# Patient Record
Sex: Male | Born: 1935
Health system: Southern US, Community
[De-identification: ages and names within clinical notes are randomized; demographics above are authoritative.]

## PROBLEM LIST (undated history)

## (undated) DIAGNOSIS — N183 Chronic kidney disease, stage 3 unspecified: Secondary | ICD-10-CM

## (undated) DIAGNOSIS — H9190 Unspecified hearing loss, unspecified ear: Secondary | ICD-10-CM

## (undated) DIAGNOSIS — I499 Cardiac arrhythmia, unspecified: Secondary | ICD-10-CM

## (undated) DIAGNOSIS — K449 Diaphragmatic hernia without obstruction or gangrene: Secondary | ICD-10-CM

## (undated) DIAGNOSIS — H269 Unspecified cataract: Secondary | ICD-10-CM

## (undated) DIAGNOSIS — E119 Type 2 diabetes mellitus without complications: Secondary | ICD-10-CM

## (undated) DIAGNOSIS — I10 Essential (primary) hypertension: Secondary | ICD-10-CM

## (undated) DIAGNOSIS — K5790 Diverticulosis of intestine, part unspecified, without perforation or abscess without bleeding: Secondary | ICD-10-CM

## (undated) DIAGNOSIS — R55 Syncope and collapse: Secondary | ICD-10-CM

## (undated) DIAGNOSIS — D649 Anemia, unspecified: Secondary | ICD-10-CM

## (undated) DIAGNOSIS — N200 Calculus of kidney: Secondary | ICD-10-CM

## (undated) DIAGNOSIS — I639 Cerebral infarction, unspecified: Secondary | ICD-10-CM

## (undated) DIAGNOSIS — H3530 Unspecified macular degeneration: Secondary | ICD-10-CM

## (undated) DIAGNOSIS — N3289 Other specified disorders of bladder: Secondary | ICD-10-CM

## (undated) HISTORY — DX: Diverticulosis of intestine, part unspecified, without perforation or abscess without bleeding: K57.90

## (undated) HISTORY — DX: Cerebral infarction, unspecified: I63.9

## (undated) HISTORY — PX: KIDNEY STONE SURGERY: SHX686

## (undated) HISTORY — DX: Syncope and collapse: R55

## (undated) HISTORY — DX: Other specified disorders of bladder: N32.89

## (undated) HISTORY — PX: HERNIA REPAIR: SHX51

## (undated) HISTORY — DX: Diaphragmatic hernia without obstruction or gangrene: K44.9

## (undated) HISTORY — PX: EYE SURGERY: SHX253

## (undated) HISTORY — DX: Calculus of kidney: N20.0

## (undated) SURGERY — Surgical Case
Anesthesia: *Unknown

---

## 2010-03-14 ENCOUNTER — Encounter: Admission: RE | Admit: 2010-03-14 | Discharge: 2010-03-14 | Payer: Self-pay | Admitting: Endocrinology

## 2010-03-19 ENCOUNTER — Emergency Department (HOSPITAL_COMMUNITY): Admission: EM | Admit: 2010-03-19 | Discharge: 2010-03-19 | Payer: Self-pay | Admitting: Emergency Medicine

## 2010-03-21 ENCOUNTER — Ambulatory Visit (HOSPITAL_COMMUNITY): Admission: RE | Admit: 2010-03-21 | Discharge: 2010-03-21 | Payer: Self-pay | Admitting: Endocrinology

## 2010-04-05 ENCOUNTER — Ambulatory Visit (HOSPITAL_COMMUNITY): Admission: RE | Admit: 2010-04-05 | Discharge: 2010-04-05 | Payer: Self-pay | Admitting: Interventional Radiology

## 2010-07-17 LAB — HEPATIC FUNCTION PANEL
ALT: 22 U/L (ref 0–53)
AST: 28 U/L (ref 0–37)
Albumin: 3.9 g/dL (ref 3.5–5.2)
Alkaline Phosphatase: 80 U/L (ref 39–117)
Total Bilirubin: 0.5 mg/dL (ref 0.3–1.2)

## 2010-07-17 LAB — URINALYSIS, ROUTINE W REFLEX MICROSCOPIC
Glucose, UA: 500 mg/dL — AB
pH: 6.5 (ref 5.0–8.0)

## 2010-07-17 LAB — BASIC METABOLIC PANEL
BUN: 23 mg/dL (ref 6–23)
CO2: 26 mEq/L (ref 19–32)
Calcium: 9.7 mg/dL (ref 8.4–10.5)
Calcium: 9.9 mg/dL (ref 8.4–10.5)
Chloride: 101 mEq/L (ref 96–112)
Creatinine, Ser: 1.49 mg/dL (ref 0.4–1.5)
GFR calc Af Amer: 47 mL/min — ABNORMAL LOW (ref >60)
GFR calc Af Amer: 54 mL/min — ABNORMAL LOW (ref >60)
Sodium: 139 mEq/L (ref 135–145)

## 2010-07-17 LAB — URINE MICROSCOPIC-ADD ON

## 2010-07-17 LAB — APTT: aPTT: 28 seconds (ref 24–37)

## 2010-07-17 LAB — PROTIME-INR: Prothrombin Time: 14 seconds (ref 11.6–15.2)

## 2010-07-17 LAB — CBC
Hemoglobin: 13.2 g/dL (ref 13.0–17.0)
MCH: 33.2 pg (ref 26.0–34.0)
RBC: 3.98 MIL/uL — ABNORMAL LOW (ref 4.22–5.81)

## 2010-07-17 LAB — DIFFERENTIAL
Eosinophils Absolute: 0.8 10*3/uL — ABNORMAL HIGH (ref 0.0–0.7)
Lymphocytes Relative: 20 % (ref 12–46)
Lymphs Abs: 1.7 10*3/uL (ref 0.7–4.0)
Monocytes Relative: 12 % (ref 3–12)
Neutrophils Relative %: 59 % (ref 43–77)

## 2012-06-24 ENCOUNTER — Emergency Department (HOSPITAL_COMMUNITY): Payer: Medicare Other

## 2012-06-24 ENCOUNTER — Encounter (HOSPITAL_COMMUNITY): Payer: Self-pay | Admitting: *Deleted

## 2012-06-24 ENCOUNTER — Inpatient Hospital Stay (HOSPITAL_COMMUNITY)
Admission: EM | Admit: 2012-06-24 | Discharge: 2012-06-28 | DRG: 494 | Disposition: A | Discharge status: Home Health | Payer: Medicare Other | Attending: Orthopedic Surgery | Admitting: Orthopedic Surgery

## 2012-06-24 DIAGNOSIS — Y9329 Activity, other involving ice and snow: Secondary | ICD-10-CM

## 2012-06-24 DIAGNOSIS — Z882 Allergy status to sulfonamides status: Secondary | ICD-10-CM

## 2012-06-24 DIAGNOSIS — H3530 Unspecified macular degeneration: Secondary | ICD-10-CM | POA: Diagnosis present

## 2012-06-24 DIAGNOSIS — I10 Essential (primary) hypertension: Secondary | ICD-10-CM | POA: Diagnosis present

## 2012-06-24 DIAGNOSIS — S42209A Unspecified fracture of upper end of unspecified humerus, initial encounter for closed fracture: Secondary | ICD-10-CM

## 2012-06-24 DIAGNOSIS — Z79899 Other long term (current) drug therapy: Secondary | ICD-10-CM

## 2012-06-24 DIAGNOSIS — S42293A Other displaced fracture of upper end of unspecified humerus, initial encounter for closed fracture: Principal | ICD-10-CM | POA: Diagnosis present

## 2012-06-24 DIAGNOSIS — W010XXA Fall on same level from slipping, tripping and stumbling without subsequent striking against object, initial encounter: Secondary | ICD-10-CM | POA: Diagnosis present

## 2012-06-24 DIAGNOSIS — Y92009 Unspecified place in unspecified non-institutional (private) residence as the place of occurrence of the external cause: Secondary | ICD-10-CM

## 2012-06-24 DIAGNOSIS — S42213A Unspecified displaced fracture of surgical neck of unspecified humerus, initial encounter for closed fracture: Secondary | ICD-10-CM | POA: Diagnosis present

## 2012-06-24 DIAGNOSIS — E119 Type 2 diabetes mellitus without complications: Secondary | ICD-10-CM | POA: Diagnosis present

## 2012-06-24 HISTORY — DX: Essential (primary) hypertension: I10

## 2012-06-24 HISTORY — DX: Type 2 diabetes mellitus without complications: E11.9

## 2012-06-24 HISTORY — DX: Unspecified macular degeneration: H35.30

## 2012-06-24 HISTORY — DX: Cardiac arrhythmia, unspecified: I49.9

## 2012-06-24 LAB — BASIC METABOLIC PANEL
Calcium: 9.3 mg/dL (ref 8.4–10.5)
GFR calc non Af Amer: 42 mL/min — ABNORMAL LOW (ref >90)
Glucose, Bld: 165 mg/dL — ABNORMAL HIGH (ref 70–99)
Potassium: 5.2 mEq/L — ABNORMAL HIGH (ref 3.5–5.1)
Sodium: 137 mEq/L (ref 135–145)

## 2012-06-24 LAB — CBC WITH DIFFERENTIAL/PLATELET
Basophils Absolute: 0 10*3/uL (ref 0.0–0.1)
Eosinophils Absolute: 0 10*3/uL (ref 0.0–0.7)
Eosinophils Relative: 0 % (ref 0–5)
Lymphocytes Relative: 4 % — ABNORMAL LOW (ref 12–46)
Lymphs Abs: 0.6 10*3/uL — ABNORMAL LOW (ref 0.7–4.0)
MCH: 32.5 pg (ref 26.0–34.0)
Neutrophils Relative %: 89 % — ABNORMAL HIGH (ref 43–77)
Platelets: 187 10*3/uL (ref 150–400)
RBC: 3.82 MIL/uL — ABNORMAL LOW (ref 4.22–5.81)
RDW: 12.7 % (ref 11.5–15.5)
WBC: 12.8 10*3/uL — ABNORMAL HIGH (ref 4.0–10.5)

## 2012-06-24 MED ORDER — HYDROCODONE-ACETAMINOPHEN 5-325 MG PO TABS
1.0000 | ORAL_TABLET | Freq: Once | ORAL | Status: AC
  Administered 2012-06-24: 1 via ORAL
  Filled 2012-06-24: qty 1

## 2012-06-24 NOTE — ED Notes (Signed)
Pt reports falling this afternoon, went to pcp today and had xrays done, has disc with him. Was told +fracture right humerus. +radial pulse

## 2012-06-24 NOTE — ED Notes (Signed)
Pt in gown. RN at the bedside.

## 2012-06-24 NOTE — ED Provider Notes (Signed)
I saw and evaluated the patient, reviewed the resident's note and I agree with the findings and plan.   Loren Racer, MD 06/24/12 2330

## 2012-06-24 NOTE — ED Notes (Signed)
Family at bedside. 

## 2012-06-24 NOTE — Progress Notes (Signed)
Orthopedic Tech Progress Note Patient Details:  Clayton [name] 01/09/1920 [id number]  Ortho Devices Type of Ortho Device: Shoulder immobilizer Ortho Device/Splint Location: (R) UE Ortho Device/Splint Interventions: Application   Jennye Moccasin 06/24/2012, 9:40 PM

## 2012-06-24 NOTE — Progress Notes (Signed)
[facility]opaedic Trauma [facility]   Pt sustained a fall this afternoon resulting in R proximal humerus fracture.  Pt presented to ED after having xrays at PCP. Ortho contacted for admission regarding injury.  Pt with displaced R proximal humerus fx.  Med hx  Notable for DM, macular degeneration and HTN.    Pt to be admitted with plan for OR in am: hemiarthroplasty vs ORIF  CT scan tonight to fully evaluate fx and for surgical planning  Full H&P to follow  Sling for comfort Ice and elevate  NPO after MN  Mearl Latin, PA-C [facility] [phone] (P) 06/24/2012 9:06 PM

## 2012-06-24 NOTE — ED Provider Notes (Signed)
History     CSN: [id number]  Arrival date & time 06/24/12  1801   First MD Initiated Contact with Patient 06/24/12 1846      Chief Complaint  Patient presents with  . Fall    HPI Jari Dipasquale is a 79 y.o. male who presents to the ED after been diagnosed with R humeral fracture.  Patient accompanied by daughter and wife who report the history.  At approx 3:20 today, patient stepped out of car and fell on ice.  Landed on R shoulder.  No LOC.  No amnesia.  No neck, back, chest, abdomen pain.  Seen by PCP and had Xrs done of R humerus and sent here for further evaluation.  Patient with isolated r shoulder pain.  Moderate in severity, worse with movement and palpation, better with rest.  No other associated symptoms.  Past Medical History  Diagnosis Date  . Macular degeneration   . Hypertension   . Diabetes mellitus without complication     History reviewed. No pertinent past surgical history.  History reviewed. No pertinent family history.  History  Substance Use Topics  . Smoking status: Not on file  . Smokeless tobacco: Not on file  . Alcohol Use: No      Review of Systems  Constitutional: Negative for fever and chills.  HENT: Negative for congestion, sore throat and neck pain.   Respiratory: Negative for cough.   Gastrointestinal: Negative for nausea, vomiting, abdominal pain, diarrhea and constipation.  Endocrine: Negative for polyuria.  Genitourinary: Negative for dysuria and hematuria.  Skin: Negative for rash.  Neurological: Negative for headaches.  Psychiatric/Behavioral: Negative.   All other systems reviewed and are negative.    Allergies  Sulfa antibiotics  Home Medications  No current outpatient prescriptions on file.  BP 170/75  Pulse 74  Temp(Src) 98 F (36.7 C) (Oral)  SpO2 96%  Physical Exam  Nursing note and vitals reviewed. Constitutional: He is oriented to person, place, and time. He appears well-developed and well-nourished. No  distress.  HENT:  Head: Normocephalic and atraumatic.  Right Ear: External ear normal.  Left Ear: External ear normal.  Mouth/Throat: Oropharynx is clear and moist. No oropharyngeal exudate.  Eyes: Conjunctivae are normal. Pupils are equal, round, and reactive to light. Right eye exhibits no discharge.  Neck: Normal range of motion. Neck supple. No tracheal deviation present.  Cardiovascular: Normal rate, regular rhythm and intact distal pulses.   Pulmonary/Chest: Effort normal. No respiratory distress. He has no wheezes. He has no rales.  Abdominal: Soft. He exhibits no distension. There is no tenderness. There is no rebound and no guarding.  Musculoskeletal: Normal range of motion. He exhibits no edema (2+ symmetric).  RUE: R proximal humerus swlling anteriorly.  Mild deformity.  2+ radial pulse.  SILT with intact motor median, ulnar, radial nerve distributions.  Neurological: He is alert and oriented to person, place, and time.  Skin: Skin is warm and dry. No rash noted. He is not diaphoretic.  Psychiatric: He has a normal mood and affect.    ED Course  Procedures (including critical care time)  Labs Reviewed  CBC WITH DIFFERENTIAL - Abnormal; Notable for the following:    WBC 12.8 (*)    RBC 3.82 (*)    Hemoglobin 12.4 (*)    HCT 36.9 (*)    Neutrophils Relative 89 (*)    Neutro Abs 11.3 (*)    Lymphocytes Relative 4 (*)    Lymphs Abs 0.6 (*)  All other components within normal limits  BASIC METABOLIC PANEL - Abnormal; Notable for the following:    Potassium 5.2 (*)    Glucose, Bld 165 (*)    BUN 37 (*)    Creatinine, Ser 1.41 (*)    GFR calc non Af Amer 42 (*)    GFR calc Af Amer 49 (*)    All other components within normal limits   Dg Shoulder Right  06/24/2012  *RADIOLOGY REPORT*  Clinical Data: Larey Seat.  Right shoulder pain.  RIGHT SHOULDER - 2+ VIEW  Comparison: None  Findings: There is a complex comminuted and displaced fracture of the humeral head neck.  The [facility]  joint is intact.  IMPRESSION: Complex comminuted and displaced humeral head and neck fractures.   Original Report Authenticated By: Rudie Meyer, M.D.    Dg Humerus Right  06/24/2012  *RADIOLOGY REPORT*  Clinical Data: Larey Seat.  Right arm pain.  RIGHT HUMERUS - 2+ VIEW  Comparison: None  Findings: There is a comminuted and displaced fracture of the humeral head/ neck.  No humeral shaft fracture.  IMPRESSION: No humeral shaft fracture.   Original Report Authenticated By: Rudie Meyer, M.D.      1. Proximal humerus fracture, right       MDM   Jayesh Marbach is a 79 y.o. male who presents who presents to the ED after mechanical ground level fall.  Patient only hurting in R proximal humerus.  Doubt concurrent injury.  XR with R comminuted fracture of humeral head.  Ortho consulted.  Ortho wanting to admit.  Patient safe for admission to floor.  Patient admitted.     Arloa Koh, MD 06/24/12 2236

## 2012-06-25 ENCOUNTER — Encounter (HOSPITAL_COMMUNITY): Payer: Self-pay | Admitting: Orthopedic Surgery

## 2012-06-25 ENCOUNTER — Inpatient Hospital Stay (HOSPITAL_COMMUNITY): Payer: Medicare Other

## 2012-06-25 LAB — APTT: aPTT: 22 seconds — ABNORMAL LOW (ref 24–37)

## 2012-06-25 LAB — HEMOGLOBIN A1C
Hgb A1c MFr Bld: 6.9 % — ABNORMAL HIGH (ref <5.7)
Mean Plasma Glucose: 151 mg/dL — ABNORMAL HIGH (ref <117)

## 2012-06-25 LAB — CBC WITH DIFFERENTIAL/PLATELET
Basophils Absolute: 0 10*3/uL (ref 0.0–0.1)
Eosinophils Absolute: 0.1 10*3/uL (ref 0.0–0.7)
Eosinophils Relative: 1 % (ref 0–5)
MCH: 32.5 pg (ref 26.0–34.0)
MCV: 96 fL (ref 78.0–100.0)
Platelets: 170 10*3/uL (ref 150–400)
RDW: 12.6 % (ref 11.5–15.5)

## 2012-06-25 LAB — PREALBUMIN: Prealbumin: 23.4 mg/dL (ref 17.0–34.0)

## 2012-06-25 LAB — COMPREHENSIVE METABOLIC PANEL
ALT: 11 U/L (ref 0–53)
Alkaline Phosphatase: 60 U/L (ref 39–117)
CO2: 23 mEq/L (ref 19–32)
Calcium: 9.2 mg/dL (ref 8.4–10.5)
GFR calc Af Amer: 47 mL/min — ABNORMAL LOW (ref >90)
GFR calc non Af Amer: 41 mL/min — ABNORMAL LOW (ref >90)
Glucose, Bld: 226 mg/dL — ABNORMAL HIGH (ref 70–99)
Sodium: 135 mEq/L (ref 135–145)

## 2012-06-25 LAB — VITAMIN D 25 HYDROXY (VIT D DEFICIENCY, FRACTURES): Vit D, 25-Hydroxy: 23 ng/mL — ABNORMAL LOW (ref 30–89)

## 2012-06-25 LAB — URINALYSIS, ROUTINE W REFLEX MICROSCOPIC
Glucose, UA: NEGATIVE mg/dL
Hgb urine dipstick: NEGATIVE
Ketones, ur: NEGATIVE mg/dL
Protein, ur: NEGATIVE mg/dL

## 2012-06-25 LAB — ABO/RH: ABO/RH(D): A POS

## 2012-06-25 LAB — URINE MICROSCOPIC-ADD ON

## 2012-06-25 MED ORDER — ADULT MULTIVITAMIN W/MINERALS CH
1.0000 | ORAL_TABLET | Freq: Every day | ORAL | Status: DC
  Administered 2012-06-25 – 2012-06-28 (×4): 1 via ORAL
  Filled 2012-06-25 (×4): qty 1

## 2012-06-25 MED ORDER — ACETAMINOPHEN 650 MG RE SUPP: 650.0000 mg | Freq: Four times a day (QID) | RECTAL | Status: DC | PRN

## 2012-06-25 MED ORDER — MORPHINE SULFATE 2 MG/ML IJ SOLN
1.0000 mg | INTRAMUSCULAR | Status: DC | PRN
  Administered 2012-06-27: 2 mg via INTRAVENOUS
  Filled 2012-06-25: qty 1

## 2012-06-25 MED ORDER — LACTATED RINGERS IV SOLN: INTRAVENOUS | Status: DC

## 2012-06-25 MED ORDER — HYDROCODONE-ACETAMINOPHEN 5-325 MG PO TABS
1.0000 | ORAL_TABLET | ORAL | Status: DC | PRN
  Administered 2012-06-25 (×2): 2 via ORAL
  Administered 2012-06-25 (×2): 1 via ORAL
  Administered 2012-06-26 – 2012-06-28 (×6): 2 via ORAL
  Filled 2012-06-25: qty 2
  Filled 2012-06-25: qty 1
  Filled 2012-06-25 (×7): qty 2

## 2012-06-25 MED ORDER — ACETAMINOPHEN 325 MG PO TABS: 650.0000 mg | ORAL_TABLET | Freq: Four times a day (QID) | ORAL | Status: DC | PRN

## 2012-06-25 MED ORDER — ONDANSETRON HCL 4 MG PO TABS: 4.0000 mg | ORAL_TABLET | Freq: Four times a day (QID) | ORAL | Status: DC | PRN

## 2012-06-25 MED ORDER — LISINOPRIL 20 MG PO TABS
20.0000 mg | ORAL_TABLET | Freq: Every day | ORAL | Status: DC
  Administered 2012-06-25 – 2012-06-27 (×3): 20 mg via ORAL
  Filled 2012-06-25 (×4): qty 1

## 2012-06-25 MED ORDER — DOCUSATE SODIUM 100 MG PO CAPS
100.0000 mg | ORAL_CAPSULE | Freq: Two times a day (BID) | ORAL | Status: DC
  Administered 2012-06-25 – 2012-06-28 (×6): 100 mg via ORAL
  Filled 2012-06-25 (×9): qty 1

## 2012-06-25 MED ORDER — CEFAZOLIN SODIUM-DEXTROSE 2-3 GM-% IV SOLR
2.0000 g | INTRAVENOUS | Status: DC
  Filled 2012-06-25: qty 50

## 2012-06-25 MED ORDER — ALUM & MAG HYDROXIDE-SIMETH 200-200-20 MG/5ML PO SUSP
30.0000 mL | Freq: Four times a day (QID) | ORAL | Status: DC | PRN
  Filled 2012-06-25 (×2): qty 30

## 2012-06-25 MED ORDER — INSULIN ASPART 100 UNIT/ML ~~LOC~~ SOLN
0.0000 [IU] | Freq: Three times a day (TID) | SUBCUTANEOUS | Status: DC
  Administered 2012-06-25: 3 [IU] via SUBCUTANEOUS
  Administered 2012-06-25 (×2): 2 [IU] via SUBCUTANEOUS
  Administered 2012-06-26 – 2012-06-27 (×2): 3 [IU] via SUBCUTANEOUS
  Administered 2012-06-27: 2 [IU] via SUBCUTANEOUS
  Administered 2012-06-27: 3 [IU] via SUBCUTANEOUS
  Administered 2012-06-28: 5 [IU] via SUBCUTANEOUS
  Administered 2012-06-28: 3 [IU] via SUBCUTANEOUS

## 2012-06-25 MED ORDER — ONDANSETRON HCL 4 MG/2ML IJ SOLN: 4.0000 mg | Freq: Four times a day (QID) | INTRAMUSCULAR | Status: DC | PRN

## 2012-06-25 NOTE — H&P (Signed)
Orthopaedic Trauma Service H&P   Chief Complaint:  Fall with R proximal humerus fracture HPI:   Patient is a very pleasant 79 year old right-hand-dominant Caucasian male who sustained a ground-level mechanical fall on 06/24/2012. Patient states that he was getting out of his car and slipped on some ice. He presented to his primary care physician where x-rays were performed and demonstrated a right proximal humerus fracture, patient was then sent to the emergency department for further evaluation and admission. Ortho was consulted regarding the patient. He was admitted for pain control as well as plan for fixation of his fracture. Patient is currently in 5 N. 14 is resting comfortably and complains only of right shoulder pain. Pain is exacerbated with movement and is relieved with rest and pain medication. Patient denies any additional injuries as a result of his fall. Patient denies any chest pain. Patient denies palpitations, shortness of breath, nausea or vomiting. No recent illnesses. No headaches or changes in vision. No localized weakness. Patient denies any numbness or tingling in his right upper extremity.  Past Medical History  Diagnosis Date  . Macular degeneration   . Hypertension   . Diabetes mellitus without complication   . paroxysmal afib   (paroxysmal afib noted on old consult note)  History reviewed. No pertinent past surgical history.  History reviewed. No pertinent family history.  Social History:  reports that he does not drink alcohol or use illicit drugs. His tobacco history is not on file. Lives in [place] with wife Live in apartment Daughter lives in [place] as well and helps out frequently   Allergies:  Allergies  Allergen Reactions  . Sulfa Antibiotics Itching    Medications Prior to Admission  Medication Sig Dispense Refill  . docusate sodium (COLACE) 100 MG capsule Take 100 mg by mouth 2 (two) times daily as needed for constipation.      . fish  oil-omega-3 fatty acids 1000 MG capsule Take 1 g by mouth daily.      Marland Kitchen lisinopril (PRINIVIL,ZESTRIL) 20 MG tablet Take 20 mg by mouth daily.      . metFORMIN (GLUCOPHAGE) 500 MG tablet Take 500 mg by mouth daily with breakfast.      . Multiple Vitamins-Minerals (ICAPS PO) Take 1 capsule by mouth daily.        Results for orders placed during the hospital encounter of 06/24/12 (from the past 48 hour(s))  CBC WITH DIFFERENTIAL     Status: Abnormal   Collection Time    06/24/12  8:06 PM      Result Value Range   WBC 12.8 (*) 4.0 - 10.5 K/uL   RBC 3.82 (*) 4.22 - 5.81 MIL/uL   Hemoglobin 12.4 (*) 13.0 - 17.0 g/dL   HCT 45.4 (*) 09.8 - 11.9 %   MCV 96.6  78.0 - 100.0 fL   MCH 32.5  26.0 - 34.0 pg   MCHC 33.6  30.0 - 36.0 g/dL   RDW 14.7  82.9 - 56.2 %   Platelets 187  150 - 400 K/uL   Neutrophils Relative 89 (*) 43 - 77 %   Neutro Abs 11.3 (*) 1.7 - 7.7 K/uL   Lymphocytes Relative 4 (*) 12 - 46 %   Lymphs Abs 0.6 (*) 0.7 - 4.0 K/uL   Monocytes Relative 7  3 - 12 %   Monocytes Absolute 0.9  0.1 - 1.0 K/uL   Eosinophils Relative 0  0 - 5 %   Eosinophils Absolute 0.0  0.0 - 0.7  K/uL   Basophils Relative 0  0 - 1 %   Basophils Absolute 0.0  0.0 - 0.1 K/uL  BASIC METABOLIC PANEL     Status: Abnormal   Collection Time    06/24/12  8:06 PM      Result Value Range   Sodium 137  135 - 145 mEq/L   Potassium 5.2 (*) 3.5 - 5.1 mEq/L   Chloride 101  96 - 112 mEq/L   CO2 23  19 - 32 mEq/L   Glucose, Bld 165 (*) 70 - 99 mg/dL   BUN 37 (*) 6 - 23 mg/dL   Creatinine, Ser 1.61 (*) 0.50 - 1.35 mg/dL   Calcium 9.3  8.4 - 09.6 mg/dL   GFR calc non Af Amer 42 (*) >90 mL/min   GFR calc Af Amer 49 (*) >90 mL/min   Comment:            The eGFR has been calculated     using the CKD EPI equation.     This calculation has not been     validated in all clinical     situations.     eGFR's persistently     <90 mL/min signify     possible Chronic Kidney Disease.  TYPE AND SCREEN     Status: None    Collection Time    06/25/12  1:00 AM      Result Value Range   ABO/RH(D) A POS     Antibody Screen NEG     Sample Expiration 06/28/2012    ABO/RH     Status: None   Collection Time    06/25/12  1:00 AM      Result Value Range   ABO/RH(D) A POS    COMPREHENSIVE METABOLIC PANEL     Status: Abnormal   Collection Time    06/25/12  1:09 AM      Result Value Range   Sodium 135  135 - 145 mEq/L   Potassium 4.3  3.5 - 5.1 mEq/L   Comment: DELTA CHECK NOTED   Chloride 100  96 - 112 mEq/L   CO2 23  19 - 32 mEq/L   Glucose, Bld 226 (*) 70 - 99 mg/dL   BUN 36 (*) 6 - 23 mg/dL   Creatinine, Ser 0.45 (*) 0.50 - 1.35 mg/dL   Calcium 9.2  8.4 - 40.9 mg/dL   Total Protein 6.9  6.0 - 8.3 g/dL   Albumin 3.7  3.5 - 5.2 g/dL   AST 17  0 - 37 U/L   ALT 11  0 - 53 U/L   Alkaline Phosphatase 60  39 - 117 U/L   Total Bilirubin 0.2 (*) 0.3 - 1.2 mg/dL   GFR calc non Af Amer 41 (*) >90 mL/min   GFR calc Af Amer 47 (*) >90 mL/min   Comment:            The eGFR has been calculated     using the CKD EPI equation.     This calculation has not been     validated in all clinical     situations.     eGFR's persistently     <90 mL/min signify     possible Chronic Kidney Disease.  MAGNESIUM     Status: None   Collection Time    06/25/12  1:09 AM      Result Value Range   Magnesium 2.0  1.5 - 2.5 mg/dL  PHOSPHORUS  Status: None   Collection Time    06/25/12  1:09 AM      Result Value Range   Phosphorus 3.6  2.3 - 4.6 mg/dL  CBC WITH DIFFERENTIAL     Status: Abnormal   Collection Time    06/25/12  1:09 AM      Result Value Range   WBC 10.0  4.0 - 10.5 K/uL   RBC 3.54 (*) 4.22 - 5.81 MIL/uL   Hemoglobin 11.5 (*) 13.0 - 17.0 g/dL   HCT 40.9 (*) 81.1 - 91.4 %   MCV 96.0  78.0 - 100.0 fL   MCH 32.5  26.0 - 34.0 pg   MCHC 33.8  30.0 - 36.0 g/dL   RDW 78.2  95.6 - 21.3 %   Platelets 170  150 - 400 K/uL   Neutrophils Relative 76  43 - 77 %   Neutro Abs 7.5  1.7 - 7.7 K/uL   Lymphocytes  Relative 11 (*) 12 - 46 %   Lymphs Abs 1.1  0.7 - 4.0 K/uL   Monocytes Relative 13 (*) 3 - 12 %   Monocytes Absolute 1.3 (*) 0.1 - 1.0 K/uL   Eosinophils Relative 1  0 - 5 %   Eosinophils Absolute 0.1  0.0 - 0.7 K/uL   Basophils Relative 0  0 - 1 %   Basophils Absolute 0.0  0.0 - 0.1 K/uL  APTT     Status: Abnormal   Collection Time    06/25/12  1:09 AM      Result Value Range   aPTT 22 (*) 24 - 37 seconds  PROTIME-INR     Status: None   Collection Time    06/25/12  1:09 AM      Result Value Range   Prothrombin Time 14.1  11.6 - 15.2 seconds   INR 1.10  0.00 - 1.49  URINALYSIS, ROUTINE W REFLEX MICROSCOPIC     Status: Abnormal   Collection Time    06/25/12  2:00 AM      Result Value Range   Color, Urine YELLOW  YELLOW   APPearance CLEAR  CLEAR   Specific Gravity, Urine 1.016  1.005 - 1.030   pH 6.0  5.0 - 8.0   Glucose, UA NEGATIVE  NEGATIVE mg/dL   Hgb urine dipstick NEGATIVE  NEGATIVE   Bilirubin Urine NEGATIVE  NEGATIVE   Ketones, ur NEGATIVE  NEGATIVE mg/dL   Protein, ur NEGATIVE  NEGATIVE mg/dL   Urobilinogen, UA 0.2  0.0 - 1.0 mg/dL   Nitrite NEGATIVE  NEGATIVE   Leukocytes, UA SMALL (*) NEGATIVE  URINE MICROSCOPIC-ADD ON     Status: Abnormal   Collection Time    06/25/12  2:00 AM      Result Value Range   Squamous Epithelial / LPF FEW (*) RARE   WBC, UA 3-6  <3 WBC/hpf   RBC / HPF 0-2  <3 RBC/hpf   Bacteria, UA RARE  RARE   Dg Shoulder Right  06/24/2012  *RADIOLOGY REPORT*  Clinical Data: Larey Seat.  Right shoulder pain.  RIGHT SHOULDER - 2+ VIEW  Comparison: None  Findings: There is a complex comminuted and displaced fracture of the humeral head neck.  The [facility] joint is intact.  IMPRESSION: Complex comminuted and displaced humeral head and neck fractures.   Original Report Authenticated By: Rudie Meyer, M.D.    Dg Humerus Right  06/24/2012  *RADIOLOGY REPORT*  Clinical Data: Larey Seat.  Right arm pain.  RIGHT HUMERUS - 2+  VIEW  Comparison: None  Findings: There is a  comminuted and displaced fracture of the humeral head/ neck.  No humeral shaft fracture.  IMPRESSION: No humeral shaft fracture.   Original Report Authenticated By: Rudie Meyer, M.D.     Review of Systems  Constitutional: Negative for fever, chills and malaise/fatigue.  HENT: Negative for sore throat.   Eyes:       Macular degeneration  Respiratory: Negative for cough, shortness of breath and wheezing.   Cardiovascular: Negative for chest pain and palpitations.  Gastrointestinal: Negative for nausea, vomiting, abdominal pain, diarrhea and constipation.  Genitourinary: Negative for dysuria.  Musculoskeletal: Positive for joint pain.       R shoulder pain  Neurological: Negative for dizziness, tingling, sensory change, focal weakness, weakness and headaches.    Blood pressure 177/83, pulse 90, temperature 97.3 F (36.3 C), temperature source Oral, resp. rate 14, SpO2 95.00%. Physical Exam  Constitutional: He is oriented to person, place, and time. He appears well-developed and well-nourished. He is sleeping and cooperative. No distress.  Appears younger than stated age  HENT:  Head: Normocephalic and atraumatic.  Mouth/Throat: Mucous membranes are dry.  Eyes: EOM are normal.  Neck: Normal range of motion and full passive range of motion without pain. Neck supple. No spinous process tenderness and no muscular tenderness present. Normal range of motion present.  Cardiovascular:  s1 and s2, 2/6 systolic murmur  Respiratory:  Unlabored CTA B  GI:  Soft, NT, +BS  Musculoskeletal:  Left upper extremity and B LEx without acute findings  R upper extremity    Sling to R arm    + swelling and ecchymosis to R shoulder    TTP R shoulder, pain with motion    R/U/M/ax sensation intact    R/U/M motor intact    Ext warm    + radial pulse    Swelling stable    Elbow, forearm, wrist and hand nontender    Clavicle, AC and  joints nontender    Pt with lesion to anterior R shoulder,  hemangioma approximately the size of a pencil eraser      Neurological: He is alert and oriented to person, place, and time.  Skin: Skin is intact.  Psychiatric: He has a normal mood and affect. His speech is normal and behavior is normal. Thought content normal.     Assessment/Plan  79 y/o RHD caucasian male s/p mechanical ground level fall  1. Mechanical fall 2. R proximal humerus fracture  Plan for R shoulder hemi on Friday 06/26/2012  Continue with sling for now  Ice prn pain and swelling  Ok to be out of bed to chair with assist  Would expect pt to stay through the weekend and be ready for d/c on Monday  Will need to see how pt does with therapy for post acute care planning 3. DM  SSI 4. HTN  Pressures elevated this am  Restart lisinopril 5. Pre-op w/u  Waiting for EKG to be done  Remainder of labs are ok  CBG slightly elevated, may be related to stress response 6. DVT/PE prophylaxis  SCD's for now   7. FEN  CHO modified diet  NPO after MN 8. Dispo  OR tomorrow  OOB as tolerated with assist  Mearl Latin, PA-C [facility] [phone] (P) 06/25/2012, 6:01 AM

## 2012-06-25 NOTE — Progress Notes (Signed)
Utilization Review Completed.   Nikitha Mode, RN, BSN Nurse Case Manager  [phone]  

## 2012-06-26 ENCOUNTER — Inpatient Hospital Stay (HOSPITAL_COMMUNITY): Payer: Medicare Other

## 2012-06-26 ENCOUNTER — Encounter (HOSPITAL_COMMUNITY)
Admission: EM | Disposition: A | Discharge status: Home Health | Payer: Self-pay | Source: Home / Self Care | Attending: Orthopedic Surgery

## 2012-06-26 ENCOUNTER — Inpatient Hospital Stay (HOSPITAL_COMMUNITY): Payer: Medicare Other | Admitting: Anesthesiology

## 2012-06-26 ENCOUNTER — Encounter (HOSPITAL_COMMUNITY): Payer: Self-pay | Admitting: Anesthesiology

## 2012-06-26 HISTORY — PX: ORIF HUMERUS FRACTURE: SHX2126

## 2012-06-26 LAB — URINE CULTURE: Colony Count: 9000

## 2012-06-26 LAB — GLUCOSE, CAPILLARY: Glucose-Capillary: 133 mg/dL — ABNORMAL HIGH (ref 70–99)

## 2012-06-26 SURGERY — OPEN REDUCTION INTERNAL FIXATION (ORIF) PROXIMAL HUMERUS FRACTURE
Anesthesia: General | Site: Shoulder | Laterality: Right | Wound class: Clean

## 2012-06-26 MED ORDER — MENTHOL 3 MG MT LOZG: 1.0000 | LOZENGE | OROMUCOSAL | Status: DC | PRN

## 2012-06-26 MED ORDER — FENTANYL CITRATE 0.05 MG/ML IJ SOLN
INTRAMUSCULAR | Status: DC | PRN
  Administered 2012-06-26: 50 ug via INTRAVENOUS

## 2012-06-26 MED ORDER — ACETAMINOPHEN 325 MG PO TABS
650.0000 mg | ORAL_TABLET | Freq: Four times a day (QID) | ORAL | Status: DC | PRN
  Filled 2012-06-26 (×2): qty 2

## 2012-06-26 MED ORDER — OXYCODONE HCL 5 MG/5ML PO SOLN: 5.0000 mg | Freq: Once | ORAL | Status: DC | PRN

## 2012-06-26 MED ORDER — PHENYLEPHRINE HCL 10 MG/ML IJ SOLN
INTRAMUSCULAR | Status: DC | PRN
  Administered 2012-06-26: 80 ug via INTRAVENOUS

## 2012-06-26 MED ORDER — MIDAZOLAM HCL 5 MG/ML IJ SOLN
1.0000 mg | Freq: Once | INTRAMUSCULAR | Status: AC
  Administered 2012-06-26: 1 mg via INTRAVENOUS

## 2012-06-26 MED ORDER — LIDOCAINE HCL (CARDIAC) 20 MG/ML IV SOLN: INTRAVENOUS | Status: DC | PRN

## 2012-06-26 MED ORDER — HYDROCODONE-ACETAMINOPHEN 5-325 MG PO TABS
ORAL_TABLET | ORAL | Status: AC
  Filled 2012-06-26: qty 2

## 2012-06-26 MED ORDER — SODIUM CHLORIDE 0.9 % IV SOLN
10.0000 mg | INTRAVENOUS | Status: DC | PRN
  Administered 2012-06-26: 20 ug/min via INTRAVENOUS

## 2012-06-26 MED ORDER — FENTANYL CITRATE 0.05 MG/ML IJ SOLN
50.0000 ug | Freq: Once | INTRAMUSCULAR | Status: AC
  Administered 2012-06-26: 50 ug via INTRAVENOUS

## 2012-06-26 MED ORDER — METOCLOPRAMIDE HCL 5 MG/ML IJ SOLN: 5.0000 mg | Freq: Three times a day (TID) | INTRAMUSCULAR | Status: DC | PRN

## 2012-06-26 MED ORDER — ROPIVACAINE HCL 5 MG/ML IJ SOLN
INTRAMUSCULAR | Status: DC | PRN
  Administered 2012-06-26: 150 mg via EPIDURAL

## 2012-06-26 MED ORDER — POLYETHYLENE GLYCOL 3350 17 G PO PACK
17.0000 g | PACK | Freq: Every day | ORAL | Status: DC
  Administered 2012-06-26 – 2012-06-28 (×3): 17 g via ORAL
  Filled 2012-06-26 (×3): qty 1

## 2012-06-26 MED ORDER — ROCURONIUM BROMIDE 100 MG/10ML IV SOLN
INTRAVENOUS | Status: DC | PRN
  Administered 2012-06-26: 20 mg via INTRAVENOUS
  Administered 2012-06-26: 50 mg via INTRAVENOUS

## 2012-06-26 MED ORDER — OXYCODONE HCL 5 MG PO TABS: 5.0000 mg | ORAL_TABLET | Freq: Once | ORAL | Status: DC | PRN

## 2012-06-26 MED ORDER — PROMETHAZINE HCL 25 MG/ML IJ SOLN: 6.2500 mg | INTRAMUSCULAR | Status: DC | PRN

## 2012-06-26 MED ORDER — DEXAMETHASONE SODIUM PHOSPHATE 4 MG/ML IJ SOLN
INTRAMUSCULAR | Status: DC | PRN
  Administered 2012-06-26: 10 mg

## 2012-06-26 MED ORDER — LACTATED RINGERS IV SOLN
INTRAVENOUS | Status: DC
  Administered 2012-06-26: 09:00:00 via INTRAVENOUS

## 2012-06-26 MED ORDER — PHENOL 1.4 % MT LIQD: 1.0000 | OROMUCOSAL | Status: DC | PRN

## 2012-06-26 MED ORDER — CEFAZOLIN SODIUM 1-5 GM-% IV SOLN
1.0000 g | Freq: Three times a day (TID) | INTRAVENOUS | Status: AC
  Administered 2012-06-26 – 2012-06-27 (×3): 1 g via INTRAVENOUS
  Filled 2012-06-26 (×3): qty 50

## 2012-06-26 MED ORDER — ONDANSETRON HCL 4 MG/2ML IJ SOLN: 4.0000 mg | Freq: Four times a day (QID) | INTRAMUSCULAR | Status: DC | PRN

## 2012-06-26 MED ORDER — SODIUM CHLORIDE 0.9 % IR SOLN
Status: DC | PRN
  Administered 2012-06-26: 1000 mL

## 2012-06-26 MED ORDER — MIDAZOLAM HCL 2 MG/2ML IJ SOLN
INTRAMUSCULAR | Status: AC
  Filled 2012-06-26: qty 2

## 2012-06-26 MED ORDER — ONDANSETRON HCL 4 MG PO TABS: 4.0000 mg | ORAL_TABLET | Freq: Four times a day (QID) | ORAL | Status: DC | PRN

## 2012-06-26 MED ORDER — LIDOCAINE HCL 4 % MT SOLN
OROMUCOSAL | Status: DC | PRN
  Administered 2012-06-26: 4 mL via TOPICAL

## 2012-06-26 MED ORDER — ALUM & MAG HYDROXIDE-SIMETH 200-200-20 MG/5ML PO SUSP
30.0000 mL | ORAL | Status: DC | PRN
  Administered 2012-06-27 (×3): 30 mL via ORAL
  Filled 2012-06-26: qty 30

## 2012-06-26 MED ORDER — METOCLOPRAMIDE HCL 10 MG PO TABS: 5.0000 mg | ORAL_TABLET | Freq: Three times a day (TID) | ORAL | Status: DC | PRN

## 2012-06-26 MED ORDER — HYDROMORPHONE HCL PF 1 MG/ML IJ SOLN: 0.2500 mg | INTRAMUSCULAR | Status: DC | PRN

## 2012-06-26 MED ORDER — FENTANYL CITRATE 0.05 MG/ML IJ SOLN
INTRAMUSCULAR | Status: AC
  Filled 2012-06-26: qty 2

## 2012-06-26 MED ORDER — GLYCOPYRROLATE 0.2 MG/ML IJ SOLN
INTRAMUSCULAR | Status: DC | PRN
  Administered 2012-06-26: .4 mg via INTRAVENOUS

## 2012-06-26 MED ORDER — CEFAZOLIN SODIUM-DEXTROSE 2-3 GM-% IV SOLR
INTRAVENOUS | Status: DC | PRN
  Administered 2012-06-26: 2 g via INTRAVENOUS

## 2012-06-26 MED ORDER — LACTATED RINGERS IV SOLN
INTRAVENOUS | Status: DC | PRN
  Administered 2012-06-26: 08:00:00 via INTRAVENOUS

## 2012-06-26 MED ORDER — NEOSTIGMINE METHYLSULFATE 1 MG/ML IJ SOLN
INTRAMUSCULAR | Status: DC | PRN
  Administered 2012-06-26: 3 mg via INTRAVENOUS

## 2012-06-26 MED ORDER — PROPOFOL 10 MG/ML IV BOLUS
INTRAVENOUS | Status: DC | PRN
  Administered 2012-06-26: 130 mg via INTRAVENOUS

## 2012-06-26 MED ORDER — POTASSIUM CHLORIDE IN NACL 20-0.9 MEQ/L-% IV SOLN
INTRAVENOUS | Status: DC
  Administered 2012-06-26: 19:00:00 via INTRAVENOUS
  Filled 2012-06-26 (×4): qty 1000

## 2012-06-26 MED ORDER — ACETAMINOPHEN 650 MG RE SUPP: 650.0000 mg | Freq: Four times a day (QID) | RECTAL | Status: DC | PRN

## 2012-06-26 MED ORDER — ONDANSETRON HCL 4 MG/2ML IJ SOLN
INTRAMUSCULAR | Status: DC | PRN
  Administered 2012-06-26: 4 mg via INTRAVENOUS

## 2012-06-26 SURGICAL SUPPLY — 79 items
BENZOIN TINCTURE PRP APPL 2/3 (GAUZE/BANDAGES/DRESSINGS) ×3 IMPLANT
BIT DRILL 2.5X110 QC LCP DISP (BIT) ×3 IMPLANT
BIT DRILL 5/64X5 DISP (BIT) ×3 IMPLANT
BLADE SAG 18X100X1.27 (BLADE) ×3 IMPLANT
BONE CHIP PRESERV 20CC (Bone Implant) ×3 IMPLANT
BRUSH FEMORAL CANAL (MISCELLANEOUS) IMPLANT
BRUSH SCRUB DISP (MISCELLANEOUS) ×6 IMPLANT
CLOTH BEACON ORANGE TIMEOUT ST (SAFETY) ×3 IMPLANT
COVER SURGICAL LIGHT HANDLE (MISCELLANEOUS) ×3 IMPLANT
DRAPE C-ARM 42X72 X-RAY (DRAPES) ×3 IMPLANT
DRAPE C-ARMOR (DRAPES) IMPLANT
DRAPE ORTHO SPLIT 77X108 STRL (DRAPES) ×2
DRAPE SURG ORHT 6 SPLT 77X108 (DRAPES) ×4 IMPLANT
DRAPE U-SHAPE 47X51 STRL (DRAPES) ×6 IMPLANT
DRSG ADAPTIC 3X8 NADH LF (GAUZE/BANDAGES/DRESSINGS) ×3 IMPLANT
DRSG MEPILEX BORDER 4X8 (GAUZE/BANDAGES/DRESSINGS) IMPLANT
DRSG PAD ABDOMINAL 8X10 ST (GAUZE/BANDAGES/DRESSINGS) ×3 IMPLANT
ELECT BLADE 6.5 EXT (BLADE) IMPLANT
ELECT REM PT RETURN 9FT ADLT (ELECTROSURGICAL) ×3
ELECTRODE REM PT RTRN 9FT ADLT (ELECTROSURGICAL) ×2 IMPLANT
EVACUATOR 1/8 PVC DRAIN (DRAIN) IMPLANT
FACESHIELD LNG OPTICON STERILE (SAFETY) IMPLANT
GLOVE BIO SURGEON STRL SZ7.5 (GLOVE) ×6 IMPLANT
GLOVE BIO SURGEON STRL SZ8 (GLOVE) ×3 IMPLANT
GLOVE BIO SURGEON STRL SZ8.5 (GLOVE) ×6 IMPLANT
GLOVE BIOGEL PI IND STRL 6.5 (GLOVE) ×2 IMPLANT
GLOVE BIOGEL PI IND STRL 7.5 (GLOVE) ×4 IMPLANT
GLOVE BIOGEL PI IND STRL 8 (GLOVE) ×4 IMPLANT
GLOVE BIOGEL PI INDICATOR 6.5 (GLOVE) ×1
GLOVE BIOGEL PI INDICATOR 7.5 (GLOVE) ×2
GLOVE BIOGEL PI INDICATOR 8 (GLOVE) ×2
GLOVE ECLIPSE 8.5 STRL (GLOVE) ×3 IMPLANT
GLOVE SURG SS PI 8.0 STRL IVOR (GLOVE) ×9 IMPLANT
GLOVE SURG SS PI 8.5 STRL IVOR (GLOVE) ×1
GLOVE SURG SS PI 8.5 STRL STRW (GLOVE) ×2 IMPLANT
GOWN PREVENTION PLUS XLARGE (GOWN DISPOSABLE) ×3 IMPLANT
GOWN PREVENTION PLUS XXLARGE (GOWN DISPOSABLE) IMPLANT
GOWN SRG XL XLNG 56XLVL 4 (GOWN DISPOSABLE) ×6 IMPLANT
GOWN STRL NON-REIN LRG LVL3 (GOWN DISPOSABLE) ×3 IMPLANT
GOWN STRL NON-REIN XL XLG LVL4 (GOWN DISPOSABLE) ×3
HANDPIECE INTERPULSE COAX TIP (DISPOSABLE)
K-WIRE PLA 9 .062 (WIRE) ×18 IMPLANT
KIT BASIN OR (CUSTOM PROCEDURE TRAY) ×3 IMPLANT
KIT ROOM TURNOVER OR (KITS) ×3 IMPLANT
MANIFOLD NEPTUNE II (INSTRUMENTS) ×3 IMPLANT
NDL SUT 6 .5 CRC .975X.05 MAYO (NEEDLE) ×2 IMPLANT
NEEDLE 1/2 CIR CATGUT .05X1.09 (NEEDLE) IMPLANT
NEEDLE HYPO 25GX1X1/2 BEV (NEEDLE) ×3 IMPLANT
NEEDLE MAYO TAPER (NEEDLE) ×1
NS IRRIG 1000ML POUR BTL (IV SOLUTION) ×3 IMPLANT
PACK TOTAL JOINT (CUSTOM PROCEDURE TRAY) ×3 IMPLANT
PAD ARMBOARD 7.5X6 YLW CONV (MISCELLANEOUS) ×6 IMPLANT
PASSER SUT SWANSON 36MM LOOP (INSTRUMENTS) ×3 IMPLANT
SCREW CORTEX 3.5 30MM (Screw) ×1 IMPLANT
SCREW LOCK CORT ST 3.5X30 (Screw) ×2 IMPLANT
SET HNDPC FAN SPRY TIP SCT (DISPOSABLE) IMPLANT
SLING ARM IMMOBILIZER LRG (SOFTGOODS) IMPLANT
SLING ARM IMMOBILIZER MED (SOFTGOODS) IMPLANT
SPONGE GAUZE 4X4 12PLY (GAUZE/BANDAGES/DRESSINGS) ×3 IMPLANT
SPONGE LAP 18X18 X RAY DECT (DISPOSABLE) ×3 IMPLANT
STAPLER VISISTAT 35W (STAPLE) ×3 IMPLANT
STRIP CLOSURE SKIN 1/2X4 (GAUZE/BANDAGES/DRESSINGS) ×3 IMPLANT
SUCTION FRAZIER TIP 10 FR DISP (SUCTIONS) ×3 IMPLANT
SUT ETHIBOND 2 OS 4 DA (SUTURE) IMPLANT
SUT ETHIBOND NAB CT1 #1 30IN (SUTURE) IMPLANT
SUT FIBERWIRE #2 38 T-5 BLUE (SUTURE) ×12
SUT VIC AB 1 CT1 27 (SUTURE) ×2
SUT VIC AB 1 CT1 27XBRD ANBCTR (SUTURE) ×4 IMPLANT
SUT VIC AB 2-0 CT1 27 (SUTURE) ×2
SUT VIC AB 2-0 CT1 TAPERPNT 27 (SUTURE) ×4 IMPLANT
SUTURE FIBERWR #2 38 T-5 BLUE (SUTURE) ×8 IMPLANT
SYR CONTROL 10ML LL (SYRINGE) ×3 IMPLANT
TOWEL OR 17X24 6PK STRL BLUE (TOWEL DISPOSABLE) ×3 IMPLANT
TOWEL OR 17X26 10 PK STRL BLUE (TOWEL DISPOSABLE) ×6 IMPLANT
TOWER CARTRIDGE SMART MIX (DISPOSABLE) IMPLANT
TRAY FOLEY CATH 14FR (SET/KITS/TRAYS/PACK) ×3 IMPLANT
WASHER 7MM DIA (Washer) ×3 IMPLANT
WATER STERILE IRR 1000ML POUR (IV SOLUTION) IMPLANT
YANKAUER SUCT BULB TIP NO VENT (SUCTIONS) ×3 IMPLANT

## 2012-06-26 NOTE — Anesthesia Preprocedure Evaluation (Signed)
Anesthesia Evaluation    Reviewed: Allergy & Precautions, H&P , NPO status , Patient's Chart, lab work & pertinent test results  History of Anesthesia Complications Negative for: history of anesthetic complications  Airway       Dental   Pulmonary neg pulmonary ROS,          Cardiovascular hypertension, + dysrhythmias     Neuro/Psych    GI/Hepatic negative GI ROS, Neg liver ROS,   Endo/Other  diabetes, Oral Hypoglycemic Agents  Renal/GU negative Renal ROS     Musculoskeletal   Abdominal   Peds  Hematology   Anesthesia Other Findings   Reproductive/Obstetrics                           Anesthesia Physical Anesthesia Plan  ASA: III  Anesthesia Plan: General   Post-op Pain Management:    Induction: Intravenous  Airway Management Planned: Oral ETT  Additional Equipment:   Intra-op Plan:   Post-operative Plan: Extubation in OR  Informed Consent:   Plan Discussed with: CRNA, Anesthesiologist and Surgeon  Anesthesia Plan Comments:         Anesthesia Quick Evaluation

## 2012-06-26 NOTE — Brief Op Note (Signed)
06/24/2012 - 06/26/2012  12:09 PM  PATIENT:  Clayton [name]  79 y.o. male  PRE-OPERATIVE DIAGNOSIS:  Fracture of proximal end of  right humerus   POST-OPERATIVE DIAGNOSIS:  Fracture of proximal end of  right humerus   PROCEDURE:  Procedure(s): OPEN REDUCTION INTERNAL FIXATION (ORIF) PROXIMAL HUMERUS FRACTURE, right  (Right)  SURGEON:  Surgeon(s) and Role:    * Budd Palmer, MD - Primary  PHYSICIAN ASSISTANT: Montez Morita, [facility]  ANESTHESIA:   general  EBL:  Total I/O In: -  Out: 1075 [Urine:1075]  BLOOD ADMINISTERED:none  DRAINS: none   LOCAL MEDICATIONS USED:  NONE  SPECIMEN:  No Specimen  DISPOSITION OF SPECIMEN:  N/A  COUNTS:  YES  TOURNIQUET:  * No tourniquets in log *  DICTATION: .Other Dictation: Dictation Number [id number]  PLAN OF CARE: Admit to inpatient   PATIENT DISPOSITION:  PACU - hemodynamically stable.   Delay start of Pharmacological VTE agent (>24hrs) due to surgical blood loss or risk of bleeding: no

## 2012-06-26 NOTE — Preoperative (Signed)
Beta Blockers   Reason not to administer Beta Blockers:Not Applicable 

## 2012-06-26 NOTE — Progress Notes (Signed)
Orthopedic Tech Progress Note Patient Details:  Clayton [name] [birth year] [id number] Neuro PACU called for mission sling for patient. Sling delivered to Neuro PACU front desk.  Ortho Devices Type of Ortho Device: Other (comment) Ortho Device/Splint Location: Mission Sling Ortho Device/Splint Interventions: Ordered   Clayton [name] 06/26/2012, 8:32 AM

## 2012-06-26 NOTE — Anesthesia Procedure Notes (Signed)
Anesthesia Regional Block:  Interscalene brachial plexus block  Pre-Anesthetic Checklist: ,, timeout performed, Correct Patient, Correct Site, Correct Laterality, Correct Procedure, Correct Position, site marked, Risks and benefits discussed,  Surgical consent,  Pre-op evaluation,  At surgeon's request and post-op pain management  Laterality: Right  Prep: chloraprep       Needles:  Injection technique: Single-shot  Needle Type: Echogenic Stimulator Needle     Needle Length: 5cm 5 cm Needle Gauge: 22 and 22 G    Additional Needles:  Procedures: ultrasound guided (picture in chart) and nerve stimulator Interscalene brachial plexus block  Nerve Stimulator or Paresthesia:  Response: bicep contraction, 0.45 mA,   Additional Responses:   Narrative:  Start time: 06/26/2012 8:17 AM End time: 06/26/2012 8:27 AM Injection made incrementally with aspirations every 5 mL.  Performed by: Personally  Anesthesiologist: J. Adonis Huguenin, MD  Additional Notes: Functioning IV was confirmed and monitors applied.  A 50mm 22ga echogenic arrow stimulator was used. Sterile prep and drape,hand hygiene and sterile gloves were used.Ultrasound guidance: relevant anatomy identified, needle position confirmed, local anesthetic spread visualized around nerve(s)., vascular puncture avoided.  Image printed for medical record.  Negative aspiration and negative test dose prior to incremental administration of local anesthetic. The patient tolerated the procedure well.  Interscalene brachial plexus block

## 2012-06-26 NOTE — Progress Notes (Signed)
I agree with the findings and plan above.  Budd Palmer, MD 06/26/2012 8:22 AM

## 2012-06-26 NOTE — OR Nursing (Signed)
Report from Schuyler Amor, RN (PACU)

## 2012-06-26 NOTE — Progress Notes (Signed)
CARE MANAGEMENT NOTE  06/26/2012   Patient:  Clayton [name], Clayton [name]   Account Number:  [id number]  Date Initiated:  06/26/2012  Documentation initiated by:  Vance Peper  Subjective/Objective Assessment:   79 yr old male s/p ORIF of right Humerus fracture     Action/Plan:   CM will follow. Patient just returning from OR.   Anticipated DC Date:     Anticipated DC Plan:           Choice offered to / List presented to:             Status of service:  In process, will continue to follow Medicare Important Message given?   (If response is "NO", the following Medicare IM given date fields will be blank) Date Medicare IM given:   Date Additional Medicare IM given:    Discharge Disposition:    Per UR Regulation:    If discussed at Long Length of Stay Meetings, dates discussed:    Comments:

## 2012-06-26 NOTE — H&P (Signed)
I saw and examined the patient yesterday but the computer system was down at that time and so record not updated until today. I agree with the findings above.  R proximal humerus fracture  UEx shoulder tender and swollen;  elbow, wrist, digits- no skin wounds, nontender, no instability, no blocks to motion  Sens  Ax/R/M/U intact  Mot   R/ PIN/ M/ AIN/ U intact  Rad 2+  Plan for R shoulder hemi on Friday 06/26/2012  I discussed with the patient and his family the risks and benefits of surgery, including the possibility of infection, nerve injury, vessel injury, wound breakdown, arthritis, symptomatic hardware, DVT/ PE, loss of motion, loss of fixation, and need for further surgery among others.  We also specifically discussed the pros and cons of attempted repair vs replacement.  He, his wife, and daughter understood these risks and wished to proceed.   Budd Palmer, MD 06/26/2012 8:23 AM

## 2012-06-26 NOTE — Op Note (Signed)
NAMEMarland Kitchen  Clayton [name], Clayton [name] NO.:  [id number]  MEDICAL RECORD NO.:  [id number]  LOCATION:  [id number]                        FACILITY:  [facility]  PHYSICIAN:  Doralee Albino. Carola Frost, M.D. DATE OF BIRTH:  10-Dec-1919  DATE OF PROCEDURE:  06/26/2012 DATE OF DISCHARGE:                              OPERATIVE REPORT   PREOPERATIVE DIAGNOSIS:  Right valgus impacted 4 part proximal humerus fracture.  POSTOPERATIVE DIAGNOSIS:  Right valgus impacted 4 part proximal humerus fracture.  PROCEDURE:  ORIF of right proximal humerus.  SURGEON:  Doralee Albino. Carola Frost, M.D.  ASSISTANT:  Mearl Latin, PA-C  ANESTHESIA:  General.  COMPLICATIONS:  None.  DISPOSITION:  To PACU.  CONDITION:  Stable.  BRIEF SUMMARY INDICATION FOR PROCEDURE:  Clayton [name] is a very pleasant 79 year old right-hand dominant male, who sustained a right 4 part proximal humerus fracture in a fall.  He underwent plain films and CT scan which demonstrated significant comminution without wide distraction but probable anatomic neck fracture.  I did discuss with the patient and his family the risks and benefits of surgical repair versus hemiarthroplasty for replacement including the functional outcomes and potential for complications with each which included loss of motion, malunion, nonunion, avascular necrosis, DVT, PE, heart attack, stroke, nerve injury, vessel injury, loss of reduction, and many others and his family did wish to proceed.  BRIEF SUMMARY OF PROCEDURE:  Clayton [name] was taken to the operating room where general anesthesia was induced.  His right upper extremity was prepped and draped in the usual sterile fashion.  A standard deltopectoral approach was then made.  The clavipectoral fascia was incised and a deep retractor placed.  The fracture site was exposed and evaluated carefully.  There was capsular attachment to the head fragment, although the fracture lines ran throughout the proximal humerus soft  tissue bridged the fracture components. Consequently it was amendable to repair and a series of #2 fiber wires sutures were then passed in the subscapularis.  The supraspinatus tendon and the tuberosity and then on the lateral side, the greater tuberosity was controlled with 2 separate #2 FiberWire sutures using Mason-Allen technique to secure them into both the bone fragment and rotator cuff. These were repaired back through bone tunnels into the shaft.  The articular segment was derotated and elevated in the appropriate neck shaft angle.  Bone graft was placed underneath this and then the shaft reduced to support it.  The tuberosities were tied over top and the long head of the biceps was left intact in its appropriate groove and used to engage proper alignment.  Following the repair, the tuberosities and shaft moved as a unit.  There was some slight motion of the greater tuberosity and this could be eliminated by tensioning the FiberWire suture.  Consequently, the 2 knots for the greater tuberosity segment including the infraspinatus were then tied over a single posed with washer distally.  This resulted in restoration of alignment, stable fixation of all fracture components, and no impairment to motion.  The wound was irrigated thoroughly and closed in standard layered fashion with 0 Vicryl, 2-0 Vicryl, and nylon.  Sterile gently compressive dressing was applied and a sling patient was awakened  from anesthesia and transported to PACU in stable condition.  Clayton Morita, PA-C did assist me during this procedure, including fixation and wound closure.  PROGNOSIS:  Clayton [name] remains at elevated risk for perioperative complications given his age and comorbidities.  However, they are well compensated and we were hopeful that he can return to his high level of activity following repair.  He will have pendulum motion for the next 2 weeks, then passive motion beginning active assisted around 6  weeks with active at 8 weeks.  We will plan to see him back after discharge at the office in 10 to 14 days.     Doralee Albino. Carola Frost, M.D.     [facility]/MEDQ  D:  06/26/2012  T:  06/26/2012  Job:  [id number]

## 2012-06-26 NOTE — Transfer of Care (Signed)
Immediate Anesthesia Transfer of Care Note  Patient: Clayton [name]  Procedure(s) Performed: Procedure(s): OPEN REDUCTION INTERNAL FIXATION (ORIF) PROXIMAL HUMERUS FRACTURE, right  (Right)  Patient Location: PACU  Anesthesia Type:General  Level of Consciousness: awake, alert  and oriented  Airway & Oxygen Therapy: Patient Spontanous Breathing and Patient connected to face mask oxygen  Post-op Assessment: Report given to PACU RN and Post -op Vital signs reviewed and stable  Post vital signs: Reviewed and stable  Complications: No apparent anesthesia complications

## 2012-06-26 NOTE — Anesthesia Postprocedure Evaluation (Signed)
  Anesthesia Post-op Note  Patient: Clayton [name]  Procedure(s) Performed: Procedure(s): OPEN REDUCTION INTERNAL FIXATION (ORIF) PROXIMAL HUMERUS FRACTURE, right  (Right)  Patient Location: PACU  Anesthesia Type:GA combined with regional for post-op pain  Level of Consciousness: awake  Airway and Oxygen Therapy: Patient Spontanous Breathing  Post-op Pain: none  Post-op Assessment: Post-op Vital signs reviewed, Patient's Cardiovascular Status Stable, Respiratory Function Stable, Patent Airway, No signs of Nausea or vomiting and Pain level controlled  Post-op Vital Signs: stable  Complications: No apparent anesthesia complications

## 2012-06-27 DIAGNOSIS — I10 Essential (primary) hypertension: Secondary | ICD-10-CM | POA: Diagnosis present

## 2012-06-27 LAB — CBC
HCT: 30.5 % — ABNORMAL LOW (ref 39.0–52.0)
Hemoglobin: 10.3 g/dL — ABNORMAL LOW (ref 13.0–17.0)
MCV: 94.7 fL (ref 78.0–100.0)
RBC: 3.22 MIL/uL — ABNORMAL LOW (ref 4.22–5.81)
RDW: 12.6 % (ref 11.5–15.5)
WBC: 16.1 10*3/uL — ABNORMAL HIGH (ref 4.0–10.5)

## 2012-06-27 LAB — BASIC METABOLIC PANEL
BUN: 30 mg/dL — ABNORMAL HIGH (ref 6–23)
CO2: 29 mEq/L (ref 19–32)
Chloride: 98 mEq/L (ref 96–112)
Creatinine, Ser: 1.25 mg/dL (ref 0.50–1.35)
Glucose, Bld: 188 mg/dL — ABNORMAL HIGH (ref 70–99)

## 2012-06-27 LAB — GLUCOSE, CAPILLARY: Glucose-Capillary: 159 mg/dL — ABNORMAL HIGH (ref 70–99)

## 2012-06-27 MED ORDER — HYDROCODONE-ACETAMINOPHEN 5-325 MG PO TABS: ORAL_TABLET | ORAL | Status: DC

## 2012-06-27 MED ORDER — SODIUM CHLORIDE 0.9 % IV SOLN
Freq: Once | INTRAVENOUS | Status: AC
  Administered 2012-06-27: 500 mL via INTRAVENOUS

## 2012-06-27 MED ORDER — BETHANECHOL CHLORIDE 25 MG PO TABS
25.0000 mg | ORAL_TABLET | Freq: Four times a day (QID) | ORAL | Status: DC
  Administered 2012-06-27 – 2012-06-28 (×3): 25 mg via ORAL
  Filled 2012-06-27 (×6): qty 1

## 2012-06-27 NOTE — Progress Notes (Signed)
Occupational Therapy Treatment Patient Details Name: Clayton [name] MRN: [id number] DOB: Apr 25, 1920 Today's Date: 06/27/2012 Time: 5284-1324 OT Time Calculation (min): 40 min  OT Assessment / Plan / Recommendation Comments on Treatment Session Pt progressing toward goals. Pt continues to demonstrate balance deficits during funtional transfers/ambulation.  Feel that pt would benefit from [facility] to increase independence and safety with ADLs. Will update plan accordingly.  Anticpate d/c in AM following PT/OT sessions.    Follow Up Recommendations  Supervision/Assistance - 24 hour;Home health OT    Barriers to Discharge       Equipment Recommendations  3 in 1 bedside comode;Tub/shower bench    Recommendations for Other Services    Frequency Min 3X/week   Plan Discharge plan needs to be updated;Equipment recommendations need to be updated    Precautions / Restrictions Precautions Precautions: Shoulder Type of Shoulder Precautions: No R shoulder ROM except during pendulums. Precaution Booklet Issued: Yes (comment) Required Braces or Orthoses:  (R UE sling) Restrictions Weight Bearing Restrictions: Yes RUE Weight Bearing: Non weight bearing   Pertinent Vitals/Pain See vitals    ADL  Eating/Feeding: Performed;Set up Where Assessed - Eating/Feeding: Chair Upper Body Dressing: Performed;Maximal assistance Where Assessed - Upper Body Dressing: Unsupported sitting Lower Body Dressing: Performed;Minimal assistance (donned shoes) Where Assessed - Lower Body Dressing: Unsupported sitting Toilet Transfer: Performed;Minimal assistance Toilet Transfer Method: Sit to stand Toilet Transfer Equipment: Raised toilet seat with arms (or 3-in-1 over toilet) Equipment Used: Gait belt Transfers/Ambulation Related to ADLs: Pt ambulated in hallway and in room with overall min guard but requires min assist for frequent LOB. Pt sometimes attempting to hold onto unstable objects for support such as swinging  door or rolling table.   ADL Comments: PA [place] present for much of session. PA and OT educated pt and family on home safety awareness and DME needs.  Feel that pt would benefit from 3n1 and tub transfer bench to increase independence and safety with transfers at home.  Also educated daughter on providing min guard/min assist when pt is mobilizing OOB at home.  Daughter verbalized and demonstrated understanding.  Pt performed pendulums with min assist but became slightly lightheaded toward end of pendulum set.  Returned pt to chair and he reported feeling better soon after, RN made aware.    OT Diagnosis:    OT Problem List:   OT Treatment Interventions:     OT Goals ADL Goals Pt Will Perform Upper Body Dressing: with min assist;Sitting, chair;Sitting, bed;Unsupported ADL Goal: Upper Body Dressing - Progress: Progressing toward goals Pt Will Perform Lower Body Dressing: with min assist;Sit to stand from chair;Sit to stand from bed;Unsupported ADL Goal: Lower Body Dressing - Progress: Progressing toward goals Pt Will Transfer to Toilet: Ambulation;with DME;Comfort height toilet;with supervision ADL Goal: Toilet Transfer - Progress: Progressing toward goals Miscellaneous OT Goals Miscellaneous OT Goal #1: Pt will perform RUE pendulums 2-3 daily at supervision level. OT Goal: Miscellaneous Goal #1 - Progress: Progressing toward goals Miscellaneous OT Goal #2: Pt will independently perform right elbow,wrist, hand AROM 3 daily. OT Goal: Miscellaneous Goal #2 - Progress: Progressing toward goals  Visit Information  Last OT Received On: 06/27/12 Assistance Needed: +1    Subjective Data      Prior Functioning  Home Living Lives With: Spouse Available Help at Discharge: Family;Available 24 hours/day (daughter can stay with pt and spouse 24/7 as long as needed) Type of Home: Apartment Home Access: Level entry Home Layout: One level Bathroom Shower/Tub: Engineer, manufacturing systems:  Handicapped height Home Adaptive Equipment: Grab bars in shower;Straight cane;Walker - rolling Prior Function Level of Independence: Independent Driving: Yes Communication Communication: No difficulties Dominant Hand: Right    Cognition  Cognition Overall Cognitive Status: Appears within functional limits for tasks assessed/performed Arousal/Alertness: Awake/alert Orientation Level: Appears intact for tasks assessed Behavior During Session: [facility] for tasks performed    Mobility  Bed Mobility Bed Mobility: Rolling Left;Left Sidelying to Sit;Sitting - Scoot to Edge of Bed Rolling Left: 4: Min guard;With rail Left Sidelying to Sit: 4: Min assist Sitting - Scoot to Edge of Bed: 5: Supervision Details for Bed Mobility Assistance: assist to support trunk OOB without use of R UE Transfers Transfers: Sit to Stand;Stand to Sit Sit to Stand: 4: Min assist;From chair/3-in-1;From bed (with LUE assist) Stand to Sit: 4: Min guard;To bed;To chair/3-in-1 (with LUE assist) Details for Transfer Assistance: Assist for power up and steadying due to inability to use RUE.  VC to control descent and reach back with LUE for armrest.    Exercises  Shoulder Exercises Pendulum Exercise: PROM;Right;10 reps;Standing Elbow Flexion: AROM;Right;5 reps;Seated Elbow Extension: AROM;Right;5 reps;Seated Wrist Flexion: AROM;Right;5 reps;Seated Wrist Extension: AROM;Right;5 reps;Seated Digit Composite Flexion: AROM;Right;5 reps;Seated Composite Extension: AROM;Right;5 reps;Seated Donning/doffing sling/immobilizer: Maximal assistance Correct positioning of sling/immobilizer: Moderate assistance;Patient able to independently direct caregiver Pendulum exercises (written home exercise program): Minimal assistance ROM for elbow, wrist and digits of operated UE: Supervision/safety;Caregiver independent with task   Balance Balance Balance Assessed: No   End of Session OT - End of Session Equipment Utilized During  Treatment: Gait belt Activity Tolerance: Patient tolerated treatment well Patient left: in chair;with call bell/phone within reach;with family/visitor present Nurse Communication: Mobility status  GO   06/27/2012 Cipriano Mile OTR/L Pager [phone] Office [phone]   Clayton [name], Clayton [name] 06/27/2012, 5:00 PM

## 2012-06-27 NOTE — Progress Notes (Signed)
Subjective: 1 Day Post-Op Procedure(s) (LRB): OPEN REDUCTION INTERNAL FIXATION (ORIF) PROXIMAL HUMERUS FRACTURE, right  (Right) Patient reports pain as 3 on 0-10 scale.    Objective: Vital signs in last 24 hours: Temp:  [96.8 F (36 C)-98.2 F (36.8 C)] 98.2 F (36.8 C) (02/22 0547) Pulse Rate:  [65-86] 80 (02/22 0547) Resp:  [13-18] 16 (02/22 0547) BP: (122-148)/(57-75) 133/62 mmHg (02/22 0547) SpO2:  [95 %-100 %] 98 % (02/22 0547)  Intake/Output from previous day: 02/21 0701 - 02/22 0700 In: 480 [P.O.:480] Out: 2675 [Urine:2675] Intake/Output this shift:     Recent Labs  06/24/12 2006 06/25/12 0109 06/27/12 0640  HGB 12.4* 11.5* 10.3*    Recent Labs  06/25/12 0109 06/27/12 0640  WBC 10.0 16.1*  RBC 3.54* 3.22*  HCT 34.0* 30.5*  PLT 170 192    Recent Labs  06/25/12 0109 06/27/12 0640  NA 135 135  K 4.3 5.2*  CL 100 98  CO2 23 29  BUN 36* 30*  CREATININE 1.45* 1.25  GLUCOSE 226* 188*  CALCIUM 9.2 9.4    Recent Labs  06/25/12 0109  INR 1.10    ABD soft Neurovascular intact Sensation intact distally Intact pulses distally Incision: scant drainage  Assessment/Plan: 1 Day Post-Op Procedure(s) (LRB): OPEN REDUCTION INTERNAL FIXATION (ORIF) PROXIMAL HUMERUS FRACTURE, right  (Right) Advance diet Up with therapy Discharge home with home health  Patient would like to go home today   May need to go home late this afternoon or tomorrow.  Desa Rech J 06/27/2012, 9:46 AM

## 2012-06-27 NOTE — Evaluation (Signed)
Physical Therapy Evaluation Patient Details Name: Clayton [name] MRN: [id number] DOB: March 31, 1920 Today's Date: 06/27/2012 Time: 7829-5621 PT Time Calculation (min): 38 min  PT Assessment / Plan / Recommendation Clinical Impression  pt presents with R Proximal Humerus fx s/o ORIF.  pt very motivated to return to prior activity level, however mildly unsteady right now.  Discussed with pt and family possibility of staying one more night in hospital with more therapy in am prior to D/C to home.  pt and family in agreement.  Feel pt just needs a little more time to resume normal mobility and improve safety.      PT Assessment  Patient needs continued PT services    Follow Up Recommendations  Outpatient PT (For high level balance)    Does the patient have the potential to tolerate intense rehabilitation      Barriers to Discharge None      Equipment Recommendations  None recommended by PT    Recommendations for Other Services     Frequency Min 3X/week    Precautions / Restrictions Precautions Precautions: Shoulder Type of Shoulder Precautions: No R shoulder ROM except during pendulums. Precaution Booklet Issued: Yes (comment) Required Braces or Orthoses: Other Brace/Splint (R UE sling) Restrictions Weight Bearing Restrictions: Yes RUE Weight Bearing: Non weight bearing   Pertinent Vitals/Pain Indicates minimal pain.        Mobility  Bed Mobility Bed Mobility: Rolling Left;Left Sidelying to Sit;Sitting - Scoot to [place] of Bed Rolling Left: 4: Min assist;With rail Left Sidelying to Sit: 4: Min assist Sitting - Scoot to [place] of Bed: 4: Min guard Details for Bed Mobility Assistance: assist to support trunk OOB without use of R UE Transfers Transfers: Sit to Stand;Stand to Sit Sit to Stand: 4: Min assist;From bed (with LUE assist) Stand to Sit: 4: Min guard;To chair/3-in-1 (with LUE assist) Details for Transfer Assistance: Assist for power up and steadying due to inability to  use RUE.  VC to control descent and reach back with LUE for armrest. Ambulation/Gait Ambulation/Gait Assistance: 4: Min assist Ambulation Distance (Feet): 200 Feet (x2) Assistive device: None;Lofstrands Ambulation/Gait Assistance Details: pt ambulated first without AD and was mildly unsteady requiring MinA to maintain balance.  On return trip trialed use of one [place] crutch to simulate cane with pt showing increased stability.  However occasionally needs MinA when attention gets distracted.   Gait Pattern: Step-through pattern;Decreased stride length Stairs: No Wheelchair Mobility Wheelchair Mobility: No    Exercises     PT Diagnosis: Difficulty walking  PT Problem List: Decreased activity tolerance;Decreased balance;Decreased mobility;Decreased knowledge of use of DME PT Treatment Interventions: DME instruction;Gait training;Functional mobility training;Therapeutic activities;Therapeutic exercise;Balance training;Patient/family education   PT Goals Acute Rehab PT Goals PT Goal Formulation: With patient Time For Goal Achievement: 07/04/12 Potential to Achieve Goals: Good Pt will go Supine/Side to Sit: with modified independence PT Goal: Supine/Side to Sit - Progress: Goal set today Pt will go Sit to Supine/Side: with modified independence PT Goal: Sit to Supine/Side - Progress: Goal set today Pt will go Sit to Stand: with modified independence PT Goal: Sit to Stand - Progress: Goal set today Pt will go Stand to Sit: with modified independence PT Goal: Stand to Sit - Progress: Goal set today Pt will Ambulate: >150 feet;with modified independence;with least restrictive assistive device PT Goal: Ambulate - Progress: Goal set today  Visit Information  Last PT Received On: 06/27/12 Assistance Needed: +1 PT/OT Co-Evaluation/Treatment: Yes    Subjective Data  Subjective: I'd  like to be able to go home.     Prior Functioning  Home Living Lives With: Spouse Available Help at  Discharge: Family;Available 24 hours/day (daughter can stay with pt and spouse 24/7 as long as needed) Type of Home: Apartment Home Access: Level entry Home Layout: One level Bathroom Shower/Tub: Engineer, manufacturing systems: Handicapped height Home Adaptive Equipment: Grab bars in shower;Straight cane;Walker - rolling Prior Function Level of Independence: Independent Driving: Yes Communication Communication: No difficulties Dominant Hand: Right    Cognition  Cognition Overall Cognitive Status: Appears within functional limits for tasks assessed/performed Arousal/Alertness: Awake/alert Orientation Level: Appears intact for tasks assessed Behavior During Session: [facility] for tasks performed    Extremity/Trunk Assessment Right Upper Extremity Assessment RUE ROM/Strength/Tone: Deficits RUE ROM/Strength/Tone Deficits: hand, wrist and elbow WFL.  No shoulder ROM except during pendulums. Left Upper Extremity Assessment LUE ROM/Strength/Tone: WFL for tasks assessed Right Lower Extremity Assessment RLE ROM/Strength/Tone: WFL for tasks assessed RLE Sensation: WFL - Light Touch Left Lower Extremity Assessment LLE ROM/Strength/Tone: WFL for tasks assessed LLE Sensation: WFL - Light Touch Trunk Assessment Trunk Assessment: Normal   Balance Balance Balance Assessed: No  End of Session PT - End of Session Equipment Utilized During Treatment: Gait belt (R Sling) Activity Tolerance: Patient tolerated treatment well Patient left: in chair;with call bell/phone within reach;with family/visitor present Nurse Communication: Mobility status  GP     Sunny Schlein, [place] [phone] 06/27/2012, 1:24 PM

## 2012-06-27 NOTE — Evaluation (Signed)
Occupational Therapy Evaluation Patient Details Name: Clayton [name] MRN: [id number] DOB: [birth year] Today's Date: 06/27/2012 Time: 6213-0865 OT Time Calculation (min): 38 min  OT Assessment / Plan / Recommendation Clinical Impression  Pt s/p ORIF of Right proximal humerus fx due to mechanical fall (slipped on ice).   Pt demosntrating balance deficits while ambulating.  Would benefit from at least one more day of hospital stay to ensure safe return home with family.  Pt's daughter present at end of session and reports she will be able to provide 24/7 assist.  Pt may need OPOT services (will leave to be determined by MD in future f/u appts).  During eval session, pt wearing RUE sling size X-large.  Pt would benefit from smaller sling size, possibly "large". Will continue to follow acutely in prep for safe return home.    OT Assessment  Patient needs continued OT Services    Follow Up Recommendations  Supervision/Assistance - 24 hour;Outpatient OT (OPOT as recommended by MD at f/u visits)    Barriers to Discharge None    Equipment Recommendations   (tbd)    Recommendations for Other Services    Frequency  Min 3X/week    Precautions / Restrictions Precautions Precautions: Shoulder Type of Shoulder Precautions: No R shoulder ROM except during pendulums. Precaution Booklet Issued: Yes (comment) Required Braces or Orthoses: Other Brace/Splint (R UE sling) Restrictions Weight Bearing Restrictions: Yes   Pertinent Vitals/Pain See vitals    ADL  Eating/Feeding: Performed;Set up Where Assessed - Eating/Feeding: Chair Upper Body Bathing: Simulated;Moderate assistance Where Assessed - Upper Body Bathing: Unsupported sitting Lower Body Bathing: Simulated;Minimal assistance Where Assessed - Lower Body Bathing: Supported sit to stand Upper Body Dressing: Performed;Maximal assistance Where Assessed - Upper Body Dressing: Unsupported sitting Lower Body Dressing: Simulated;Moderate  assistance Where Assessed - Lower Body Dressing: Supported sit to stand Toilet Transfer: Simulated;Minimal assistance;Min Pension scheme manager Method: Sit to Barista: Other (comment) (bed to ambulate in hall then return to chair) Equipment Used: Gait belt;Cane Transfers/Ambulation Related to ADLs: Pt ambulated in room and hall with overall min guard but several LOB requiring min assist to correct.  Pt often loses balance when slowing down to look around (such as to look into a room while ambulating in hall).  Attempted use of cane when ambulating back to pt room.  Pt with decreased gait speed with cane but more steady.   ADL Comments: Educated pt and family (wife, daughter) on shoulder limitations, with emphasis on no right shoulder ROM except during pendulums.  Pt's family arrived at end of session and pt requesting to visit. Will return this afternoon to continue education and pendulums.    OT Diagnosis: Generalized weakness;Acute pain  OT Problem List: Decreased strength;Impaired balance (sitting and/or standing);Decreased range of motion;Decreased knowledge of use of DME or AE;Decreased knowledge of precautions;Impaired UE functional use;Pain OT Treatment Interventions: Self-care/ADL training;Therapeutic exercise;DME and/or AE instruction;Therapeutic activities;Patient/family education;Balance training   OT Goals Acute Rehab OT Goals OT Goal Formulation: With patient Time For Goal Achievement: 07/04/12 Potential to Achieve Goals: Good ADL Goals Pt Will Perform Grooming: with min assist;Unsupported;Sitting, chair;Sitting, edge of bed ADL Goal: Grooming - Progress: Goal set today Pt Will Perform Upper Body Bathing: with min assist;Sitting, chair;Sitting, edge of bed;Unsupported ADL Goal: Upper Body Bathing - Progress: Goal set today Pt Will Perform Lower Body Bathing: with min assist;Sit to stand from bed;Sit to stand in shower;Unsupported ADL Goal: Lower Body  Bathing - Progress: Goal set today Pt Will Perform  Upper Body Dressing: with min assist;Sitting, chair;Sitting, bed;Unsupported ADL Goal: Upper Body Dressing - Progress: Goal set today Pt Will Perform Lower Body Dressing: with min assist;Sit to stand from chair;Sit to stand from bed;Unsupported ADL Goal: Lower Body Dressing - Progress: Goal set today Pt Will Transfer to Toilet: Ambulation;with DME;Comfort height toilet;with supervision ADL Goal: Toilet Transfer - Progress: Goal set today Miscellaneous OT Goals Miscellaneous OT Goal #1: Pt will perform RUE pendulums 2-3 daily at supervision level. OT Goal: Miscellaneous Goal #1 - Progress: Goal set today Miscellaneous OT Goal #2: Pt will independently perform right elbow,wrist, hand AROM 3 daily. OT Goal: Miscellaneous Goal #2 - Progress: Goal set today  Visit Information  Last OT Received On: 06/27/12 Assistance Needed: +1 PT/OT Co-Evaluation/Treatment: Yes    Subjective Data  Subjective: "I think I will walk better at home" Patient Stated Goal: To return home   Prior Functioning     Home Living Lives With: Spouse Available Help at Discharge: Family;Available 24 hours/day (daughter can stay with pt and spouse 24/7 as long as needed) Type of Home: Apartment Home Access: Level entry Home Layout: One level Bathroom Shower/Tub: Engineer, manufacturing systems: Handicapped height Home Adaptive Equipment: Grab bars in shower;Straight cane;Walker - rolling Prior Function Level of Independence: Independent Driving: Yes Communication Communication: No difficulties Dominant Hand: Right         Vision/Perception Vision - History Baseline Vision: Wears glasses all the time Patient Visual Report: No change from baseline   Cognition  Cognition Overall Cognitive Status: Appears within functional limits for tasks assessed/performed Arousal/Alertness: Awake/alert Orientation Level: Appears intact for tasks assessed Behavior  During Session: [facility] for tasks performed    Extremity/Trunk Assessment Right Upper Extremity Assessment RUE ROM/Strength/Tone: Deficits RUE ROM/Strength/Tone Deficits: hand, wrist and elbow WFL.  No shoulder ROM except during pendulums. Left Upper Extremity Assessment LUE ROM/Strength/Tone: WFL for tasks assessed     Mobility Bed Mobility Bed Mobility: Rolling Left;Left Sidelying to Sit;Sitting - Scoot to [place] of Bed Rolling Left: 4: Min assist;With rail Left Sidelying to Sit: 4: Min assist Sitting - Scoot to [place] of Bed: 4: Min guard Details for Bed Mobility Assistance: assist to support trunk OOB without use of R UE Transfers Transfers: Sit to Stand;Stand to Sit Sit to Stand: 4: Min assist;From bed (with LUE assist) Stand to Sit: 4: Min guard;To chair/3-in-1 (with LUE assist) Details for Transfer Assistance: Assist for power up and steadying due to inability to use RUE.  VC to control descent and reach back with LUE for armrest.     Exercise     Balance     End of Session OT - End of Session Equipment Utilized During Treatment: Gait belt Activity Tolerance: Patient tolerated treatment well Patient left: in chair;with call bell/phone within reach;with family/visitor present Nurse Communication: Mobility status  GO    06/27/2012 Cipriano Mile OTR/L Pager [phone] Office [phone]  [place], [place] 06/27/2012, 1:22 PM

## 2012-06-28 LAB — BASIC METABOLIC PANEL
BUN: 35 mg/dL — ABNORMAL HIGH (ref 6–23)
CO2: 30 mEq/L (ref 19–32)
Calcium: 8.7 mg/dL (ref 8.4–10.5)
GFR calc non Af Amer: 48 mL/min — ABNORMAL LOW (ref >90)
Glucose, Bld: 173 mg/dL — ABNORMAL HIGH (ref 70–99)

## 2012-06-28 LAB — GLUCOSE, CAPILLARY: Glucose-Capillary: 229 mg/dL — ABNORMAL HIGH (ref 70–99)

## 2012-06-28 LAB — CBC
HCT: 27.8 % — ABNORMAL LOW (ref 39.0–52.0)
Hemoglobin: 9.6 g/dL — ABNORMAL LOW (ref 13.0–17.0)
MCH: 32.8 pg (ref 26.0–34.0)
MCHC: 34.5 g/dL (ref 30.0–36.0)

## 2012-06-28 MED ORDER — LISINOPRIL 20 MG PO TABS
20.0000 mg | ORAL_TABLET | Freq: Every day | ORAL | Status: DC
Start: 2012-06-29 — End: 2012-06-28

## 2012-06-28 MED ORDER — BISACODYL 10 MG RE SUPP
10.0000 mg | Freq: Every day | RECTAL | Status: DC | PRN
  Administered 2012-06-28: 10 mg via RECTAL
  Filled 2012-06-28: qty 1

## 2012-06-28 NOTE — Discharge Summary (Signed)
Patient ID: Clayton [name] MRN: [id number] DOB/AGE: [birth year] 77 y.o.  Admit date: 06/24/2012 Discharge date: 06/28/2012  Admission Diagnoses:  Principal Problem:   Proximal humerus fracture, right Active Problems:   Diabetes   Essential hypertension, benign   Discharge Diagnoses:  Same  Past Medical History  Diagnosis Date  . Macular degeneration   . Hypertension   . Diabetes mellitus without complication   . paroxysmal afib     Surgeries: Procedure(s): OPEN REDUCTION INTERNAL FIXATION (ORIF) PROXIMAL HUMERUS FRACTURE, right  on 06/24/2012 - 06/26/2012   Consultants:    Discharged Condition: Improved  Hospital Course: Clayton [name] is an 79 y.o. male who was admitted 06/24/2012 for operative treatment ofProximal humerus fracture. Patient has severe unremitting pain that affects sleep, daily activities, and work/hobbies. After pre-op clearance the patient was taken to the operating room on 06/24/2012 - 06/26/2012 and underwent  Procedure(s): OPEN REDUCTION INTERNAL FIXATION (ORIF) PROXIMAL HUMERUS FRACTURE, right .    Patient was given perioperative antibiotics: Anti-infectives   Start     Dose/Rate Route Frequency Ordered Stop   06/26/12 1445  ceFAZolin (ANCEF) IVPB 1 g/50 mL premix     1 g 100 mL/hr over 30 Minutes Intravenous 3 times per day 06/26/12 1425 06/27/12 0630   06/25/12 1000  ceFAZolin (ANCEF) IVPB 2 g/50 mL premix  Status:  Discontinued     2 g 100 mL/hr over 30 Minutes Intravenous On call to O.R. 06/25/12 0033 06/26/12 1425       Patient was given sequential compression devices, early ambulation, and chemoprophylaxis to prevent DVT.  Post op day one patient had some slight unsteadiness with ambulation so he was kept in the hospital for monitoring for another day.  Patient benefited maximally from hospital stay and there were no other complications.    Recent vital signs: Patient Vitals for the past 24 hrs:  BP Temp Temp src Pulse Resp SpO2  06/27/12 2230  118/60 mmHg 98.2 F (36.8 C) Oral 83 16 94 %  06/27/12 1450 122/55 mmHg 97.9 F (36.6 C) - 69 17 98 %     Recent laboratory studies:  Recent Labs  06/27/12 0640 06/28/12 0605  WBC 16.1* 13.2*  HGB 10.3* 9.6*  HCT 30.5* 27.8*  PLT 192 189  NA 135 130*  K 5.2* 5.3*  CL 98 96  CO2 29 30  BUN 30* 35*  CREATININE 1.25 1.26  GLUCOSE 188* 173*  CALCIUM 9.4 8.7     Discharge Medications:     Medication List    TAKE these medications       docusate sodium 100 MG capsule  Commonly known as:  COLACE  Take 100 mg by mouth 2 (two) times daily as needed for constipation.     fish oil-omega-3 fatty acids 1000 MG capsule  Take 1 g by mouth daily.     HYDROcodone-acetaminophen 5-325 MG per tablet  Commonly known as:  NORCO/VICODIN  1-2 tablets every 4-6 hrs as needed for pain     ICAPS PO  Take 1 capsule by mouth daily.     lisinopril 20 MG tablet  Commonly known as:  PRINIVIL,ZESTRIL  Take 20 mg by mouth daily.     metFORMIN 500 MG tablet  Commonly known as:  GLUCOPHAGE  Take 500 mg by mouth daily with breakfast.        Diagnostic Studies: Dg Shoulder Right  06/26/2012  *RADIOLOGY REPORT*  Clinical Data: Right proximal humerus fracture  DG C-ARM 1-60 MIN,RIGHT SHOULDER -  2+ VIEW  Comparison: Multiple priors  Findings: Open reduction internal fixation of proximal venous fracture without visible metallic hardware.  IMPRESSION: Improved position and alignment.   Original Report Authenticated By: Davonna Belling, M.D.    Dg Shoulder Right  06/24/2012  *RADIOLOGY REPORT*  Clinical Data: Larey Seat.  Right shoulder pain.  RIGHT SHOULDER - 2+ VIEW  Comparison: None  Findings: There is a complex comminuted and displaced fracture of the humeral head neck.  The [facility] joint is intact.  IMPRESSION: Complex comminuted and displaced humeral head and neck fractures.   Original Report Authenticated By: Rudie Meyer, M.D.    Ct Shoulder Right Wo Contrast  06/25/2012  *RADIOLOGY REPORT*  Clinical  Data: Proximal right humerus fracture.  CT OF THE RIGHT SHOULDER WITHOUT CONTRAST  Technique:  Multidetector CT imaging was performed according to the standard protocol. Multiplanar CT image reconstructions were also generated.  Comparison: None.  Findings: There is a comminuted four part fracture of the proximal right humerus.  There is no tendinous entrapment in the bicipital groove however the fracture does extend through the groove.  The majority of the articular surface of the humeral head remains intact.  There is anterior displacement of the proximal humeral metaphysis relative to the head.  The glenoid and scapula are intact.  Visualized right lung appears normal.  Mild AC joint osteoarthritis.  Type 2 acromion.  There is no rotator cuff muscular atrophy.  Hemarthrosis of the right shoulder.   On sagittal imaging, there is apex anterior angulation and about 2 cm displacement of the humeral metaphysis relative to the humeral head. The greater tuberosity fracture is nondisplaced.  IMPRESSION: Comminuted displaced four part proximal right humerus fracture.   Original Report Authenticated By: Andreas Newport, M.D.    Portable Chest 1 View  06/25/2012  *RADIOLOGY REPORT*  Clinical Data: Preoperative evaluation for right shoulder surgery.  PORTABLE CHEST - 1 VIEW  Comparison: Chest x-ray 12/20/2009.  Findings: There are extensive linear opacities in the left base, and to a lesser extent in the left perihilar region, which are significantly increased compared to the prior study from 12/20/2009.  The appearance is favored to reflect areas of subsegmental atelectasis and/or scarring.  Right lung appears clear. No pleural effusions.  Pulmonary vasculature is within normal limits.  Heart size is borderline enlarged.  Moderate sized hiatal hernia.  Upper mediastinal contours are within normal limits.  Atherosclerosis in the thoracic aorta.  IMPRESSION: 1.  New multifocal linear opacities in the left perihilar region  and inferior aspect of the left lung, as above, likely reflect areas of subsegmental atelectasis and/or scarring. 2.  Moderate sized hiatal hernia. 3.  Atherosclerosis.   Original Report Authenticated By: Trudie Reed, M.D.    Dg Humerus Right  06/24/2012  *RADIOLOGY REPORT*  Clinical Data: Larey Seat.  Right arm pain.  RIGHT HUMERUS - 2+ VIEW  Comparison: None  Findings: There is a comminuted and displaced fracture of the humeral head/ neck.  No humeral shaft fracture.  IMPRESSION: No humeral shaft fracture.   Original Report Authenticated By: Rudie Meyer, M.D.    Dg C-arm 1-60 Min  06/26/2012  *RADIOLOGY REPORT*  Clinical Data: Right proximal humerus fracture  DG C-ARM 1-60 MIN,RIGHT SHOULDER - 2+ VIEW  Comparison: Multiple priors  Findings: Open reduction internal fixation of proximal venous fracture without visible metallic hardware.  IMPRESSION: Improved position and alignment.   Original Report Authenticated By: Davonna Belling, M.D.     Disposition: Final discharge disposition not confirmed  Discharge Orders   Future Orders Complete By Expires     Change dressing (specify)  As directed     Comments:      Dressing change: daily swab wound with alcohol or betadine cover with dry gauze    Diet - low sodium heart healthy  As directed     Discharge instructions  As directed     Comments:      No weight on the right upper extremity.  Pendulum exercise.    Driving Restrictions  As directed     Comments:      No driving while he is in the sling.    Increase activity slowly  As directed        Follow-up Information   Follow up with HANDY,MICHAEL H, MD In 1 week. (call for appt time)    Contact information:   [address] [place] [address] [phone]        Signed: Pascal Lux 06/28/2012, 7:40 AM

## 2012-06-28 NOTE — Care Management Note (Signed)
Spoke with patient's daughter to offer home health choice prior to discharge. Patient lives with his wife.Chose [facility] for PT/OT services. Has rolling walker already and has 3 in 1 and shower bench at bedside prior to discharge. Confirmed with [facility] that they can accept patient for these services. Faxed face sheet with faxed confirmation receipt. No further needs assessed.   [place], [place] [phone]

## 2012-06-28 NOTE — Progress Notes (Signed)
Subjective: 2 Days Post-Op Procedure(s) (LRB): OPEN REDUCTION INTERNAL FIXATION (ORIF) PROXIMAL HUMERUS FRACTURE, right  (Right) Patient reports pain as 3 on 0-10 scale.    Objective: Vital signs in last 24 hours: Temp:  [97.9 F (36.6 C)-98.2 F (36.8 C)] 98.2 F (36.8 C) (02/22 2230) Pulse Rate:  [69-83] 83 (02/22 2230) Resp:  [16-17] 16 (02/22 2230) BP: (118-122)/(55-60) 118/60 mmHg (02/22 2230) SpO2:  [94 %-98 %] 94 % (02/22 2230)  Intake/Output from previous day: 02/22 0701 - 02/23 0700 In: 740 [P.O.:240; I.V.:500] Out: 800 [Urine:800] Intake/Output this shift:     Recent Labs  06/27/12 0640 06/28/12 0605  HGB 10.3* 9.6*    Recent Labs  06/27/12 0640 06/28/12 0605  WBC 16.1* 13.2*  RBC 3.22* 2.93*  HCT 30.5* 27.8*  PLT 192 189    Recent Labs  06/27/12 0640 06/28/12 0605  NA 135 130*  K 5.2* 5.3*  CL 98 96  CO2 29 30  BUN 30* 35*  CREATININE 1.25 1.26  GLUCOSE 188* 173*  CALCIUM 9.4 8.7   No results found for this basename: LABPT, INR,  in the last 72 hours  ABD soft Neurovascular intact Sensation intact distally Intact pulses distally Incision: scant drainage  Assessment/Plan: 2 Days Post-Op Procedure(s) (LRB): OPEN REDUCTION INTERNAL FIXATION (ORIF) PROXIMAL HUMERUS FRACTURE, right  (Right) Up with therapy Discharge home with home health  Pascal Lux 06/28/2012, 7:36 AM

## 2012-06-28 NOTE — Progress Notes (Signed)
D/C instructions reviewed with patient, wife, daughter and son-in-law. RX x1 given. Sling in place on d/c. hh services arranged with [facility]. hh equipment at the bedside. All questions answerd. Pt d/c'ed via wheelchair in stable condition

## 2012-06-28 NOTE — Progress Notes (Signed)
Physical Therapy Treatment Patient Details Name: Clayton [name] MRN: [id number] DOB: [birth year] Today's Date: 06/28/2012 Time: 4098-1191 PT Time Calculation (min): 24 min  PT Assessment / Plan / Recommendation Comments on Treatment Session  Pt is a 79 y.o/M s/p rt humerus fx. Incr balance with use of SPC today with mobiltiy. Pt has 24 hour assist at home. Has all needed DME (tub bench and 3N1 in room).     Follow Up Recommendations  Outpatient PT           Equipment Recommendations  None recommended by PT       Frequency Min 3X/week   Plan Discharge plan remains appropriate;Frequency remains appropriate    Precautions / Restrictions Precautions Precautions: Shoulder Required Braces or Orthoses: Other Brace/Splint Other Brace/Splint: R UE sling Restrictions RUE Weight Bearing: Non weight bearing       Mobility  Bed Mobility Rolling Left: 5: Supervision Left Sidelying to Sit: 5: Supervision;HOB flat Sitting - Scoot to Edge of Bed: 5: Supervision Details for Bed Mobility Assistance: cues for technique (bend knees and roll onto left side, bring legs off bed first and then use left arm to assist with trunk elevation) Transfers Sit to Stand: 4: Min guard;From bed;From toilet;With upper extremity assist Stand to Sit: 5: Supervision;To chair/3-in-1;To toilet;With upper extremity assist Details for Transfer Assistance: pt used his LUE only with transfers and demo'd incr stability today with transfers. Ambulation/Gait Ambulation/Gait Assistance: 4: Min guard Ambulation Distance (Feet): 80 Feet Assistive device: Straight cane Ambulation/Gait Assistance Details: pt demo's slight instability with gait with occasional veering noted, improved with use of cane today. No LOB noted as pt able to self correct veering with good stepping strategies. Less instability noted with incr gait speed. Gait Pattern: Step-through pattern;Decreased stride length      PT Goals Acute Rehab PT  Goals PT Goal: Supine/Side to Sit - Progress: Progressing toward goal PT Goal: Sit to Stand - Progress: Progressing toward goal PT Goal: Stand to Sit - Progress: Progressing toward goal PT Goal: Ambulate - Progress: Progressing toward goal  Visit Information  Last PT Received On: 06/28/12 Assistance Needed: +1    Subjective Data  Subjective: No new complaints, agreeable to therapy at this time.   Cognition  Cognition Overall Cognitive Status: Appears within functional limits for tasks assessed/performed Arousal/Alertness: Awake/alert Orientation Level: Appears intact for tasks assessed Behavior During Session: [facility] for tasks performed       End of Session PT - End of Session Equipment Utilized During Treatment: Gait belt Activity Tolerance: Patient tolerated treatment well Patient left: in chair;with call bell/phone within reach Nurse Communication: Mobility status   GP     Sallyanne Kuster 06/28/2012, 10:45 AM  Sallyanne Kuster, PTA Office- [phone]

## 2012-06-28 NOTE — Progress Notes (Signed)
Occupational Therapy Treatment Patient Details Name: Clayton [name] MRN: [id number] DOB: 05-07-1919 Today's Date: 06/28/2012 Time: 4098-1191 OT Time Calculation (min): 46 min  OT Assessment / Plan / Recommendation Comments on Treatment Session Pt making good progress and anticipating d/c home today.    Follow Up Recommendations  Supervision/Assistance - 24 hour;Home health OT    Barriers to Discharge       Equipment Recommendations  3 in 1 bedside comode;Tub/shower bench    Recommendations for Other Services    Frequency Min 3X/week   Plan Discharge plan remains appropriate    Precautions / Restrictions Precautions Precautions: Shoulder Type of Shoulder Precautions: No R shoulder ROM except during pendulums. Required Braces or Orthoses: Other Brace/Splint Other Brace/Splint: R UE sling Restrictions RUE Weight Bearing: Non weight bearing   Pertinent Vitals/Pain See vitals    ADL  Eating/Feeding: Performed;Set up Where Assessed - Eating/Feeding: Chair Grooming: Performed;Wash/dry hands;Set up Where Assessed - Grooming: Unsupported standing Upper Body Bathing: Simulated;Minimal assistance Where Assessed - Upper Body Bathing: Unsupported sitting Lower Body Bathing: Simulated;Minimal assistance Where Assessed - Lower Body Bathing: Supported sit to stand Upper Body Dressing: Performed;Moderate assistance Where Assessed - Upper Body Dressing: Unsupported sitting Lower Body Dressing: Performed;Minimal assistance Where Assessed - Lower Body Dressing: Supported sit to stand Toilet Transfer: Performed;Minimal assistance Toilet Transfer Method: Sit to Barista: Comfort height toilet;Grab bars Toileting - Architect and Hygiene: Performed;Minimal assistance Where Assessed - Engineer, mining and Hygiene: Standing Equipment Used: Cane;Gait belt Transfers/Ambulation Related to ADLs: min assist for steadying and verbal cues for  sequencing with cane ADL Comments: Pt performed full dressing ADL.  Educated pt and family on using "baggy" clothes for ease and efficiency (pt easily fatigued after fastening buttons on pants and shirt). Pt requesting to go to bathroom several times during session in attempts to have BM. Pt's children able to demonstrate independence in assisting pt with mobility and ADLs.  Pt too fatigued to perform pendulums this session. Daughter able to demosntrate and verbalized understand HEP and reports she will do them with pt at home today.    OT Diagnosis:    OT Problem List:   OT Treatment Interventions:     OT Goals ADL Goals Pt Will Perform Grooming: with min assist;Unsupported;Sitting, chair;Sitting, edge of bed ADL Goal: Grooming - Progress: Met Pt Will Perform Upper Body Bathing: with min assist;Sitting, chair;Sitting, edge of bed;Unsupported ADL Goal: Upper Body Bathing - Progress: Met Pt Will Perform Lower Body Bathing: with min assist;Sit to stand from bed;Sit to stand in shower;Unsupported ADL Goal: Lower Body Bathing - Progress: Met Pt Will Perform Upper Body Dressing: with min assist;Sitting, chair;Sitting, bed;Unsupported ADL Goal: Upper Body Dressing - Progress: Progressing toward goals Pt Will Perform Lower Body Dressing: with min assist;Sit to stand from chair;Sit to stand from bed;Unsupported ADL Goal: Lower Body Dressing - Progress: Progressing toward goals Pt Will Transfer to Toilet: Ambulation;with DME;Comfort height toilet;with supervision ADL Goal: Toilet Transfer - Progress: Progressing toward goals  Visit Information  Last OT Received On: 06/28/12 Assistance Needed: +1    Subjective Data      Prior Functioning       Cognition  Cognition Overall Cognitive Status: Appears within functional limits for tasks assessed/performed Arousal/Alertness: Awake/alert Orientation Level: Appears intact for tasks assessed Behavior During Session: [facility] for tasks performed     Mobility  Bed Mobility Bed Mobility: Not assessed Transfers Transfers: Sit to Stand;Stand to Sit Sit to Stand: 4: Min guard;From toilet;From chair/3-in-1;With  armrests;With upper extremity assist Stand to Sit: 4: Min guard;To chair/3-in-1;To toilet Details for Transfer Assistance: uses LUE only for assist.    Exercises  Donning/doffing shirt without moving shoulder: Moderate assistance Donning/doffing sling/immobilizer: Moderate assistance Correct positioning of sling/immobilizer: Minimal assistance;Patient able to independently direct caregiver ROM for elbow, wrist and digits of operated UE: Supervision/safety;Caregiver independent with task   Balance     End of Session OT - End of Session Equipment Utilized During Treatment: Gait belt Activity Tolerance: Patient limited by fatigue Patient left: in chair;with call bell/phone within reach;with family/visitor present Nurse Communication: Mobility status;Other (comment) (pt needs smaller sling)  GO   06/28/2012 Clayton [name] OTR/L Pager [phone] Office [phone]   Clayton [name], Clayton [name] 06/28/2012, 3:55 PM

## 2012-06-29 ENCOUNTER — Encounter (HOSPITAL_COMMUNITY): Payer: Self-pay | Admitting: Orthopedic Surgery

## 2012-06-30 LAB — VITAMIN D 1,25 DIHYDROXY
Vitamin D 1, 25 (OH)2 Total: 39 pg/mL (ref 18–72)
Vitamin D2 1, 25 (OH)2: <8 pg/mL

## 2012-12-04 ENCOUNTER — Other Ambulatory Visit: Payer: Self-pay | Admitting: Orthopedic Surgery

## 2012-12-04 DIAGNOSIS — M25511 Pain in right shoulder: Secondary | ICD-10-CM

## 2012-12-07 ENCOUNTER — Ambulatory Visit
Admission: RE | Admit: 2012-12-07 | Discharge: 2012-12-07 | Disposition: A | Discharge status: Home / Self Care | Payer: Medicare Other | Source: Ambulatory Visit | Attending: Orthopedic Surgery | Admitting: Orthopedic Surgery

## 2012-12-07 DIAGNOSIS — M25511 Pain in right shoulder: Secondary | ICD-10-CM

## 2013-05-19 ENCOUNTER — Other Ambulatory Visit (HOSPITAL_COMMUNITY): Payer: Self-pay | Admitting: Interventional Radiology

## 2013-05-19 ENCOUNTER — Ambulatory Visit (HOSPITAL_COMMUNITY)
Admission: RE | Admit: 2013-05-19 | Discharge: 2013-05-19 | Disposition: A | Discharge status: Home / Self Care | Payer: PRIVATE HEALTH INSURANCE | Source: Ambulatory Visit | Attending: Interventional Radiology | Admitting: Interventional Radiology

## 2013-05-19 DIAGNOSIS — W19XXXA Unspecified fall, initial encounter: Secondary | ICD-10-CM

## 2013-05-19 DIAGNOSIS — M549 Dorsalgia, unspecified: Secondary | ICD-10-CM

## 2013-05-25 ENCOUNTER — Ambulatory Visit (HOSPITAL_COMMUNITY)
Admission: RE | Admit: 2013-05-25 | Discharge: 2013-05-25 | Disposition: A | Discharge status: Home / Self Care | Payer: PRIVATE HEALTH INSURANCE | Source: Ambulatory Visit | Attending: Interventional Radiology | Admitting: Interventional Radiology

## 2013-05-25 DIAGNOSIS — S22009A Unspecified fracture of unspecified thoracic vertebra, initial encounter for closed fracture: Secondary | ICD-10-CM | POA: Insufficient documentation

## 2013-05-25 DIAGNOSIS — M51379 Other intervertebral disc degeneration, lumbosacral region without mention of lumbar back pain or lower extremity pain: Secondary | ICD-10-CM | POA: Insufficient documentation

## 2013-05-25 DIAGNOSIS — Y929 Unspecified place or not applicable: Secondary | ICD-10-CM | POA: Insufficient documentation

## 2013-05-25 DIAGNOSIS — M8448XA Pathological fracture, other site, initial encounter for fracture: Secondary | ICD-10-CM | POA: Insufficient documentation

## 2013-05-25 DIAGNOSIS — W19XXXA Unspecified fall, initial encounter: Secondary | ICD-10-CM | POA: Insufficient documentation

## 2013-05-25 DIAGNOSIS — M5137 Other intervertebral disc degeneration, lumbosacral region: Secondary | ICD-10-CM | POA: Insufficient documentation

## 2013-05-25 DIAGNOSIS — M549 Dorsalgia, unspecified: Secondary | ICD-10-CM

## 2013-05-27 ENCOUNTER — Telehealth (HOSPITAL_COMMUNITY): Payer: Self-pay | Admitting: Interventional Radiology

## 2013-05-27 ENCOUNTER — Encounter (HOSPITAL_COMMUNITY): Payer: Self-pay | Admitting: Pharmacy Technician

## 2013-05-27 ENCOUNTER — Other Ambulatory Visit: Payer: Self-pay | Admitting: Radiology

## 2013-05-27 ENCOUNTER — Other Ambulatory Visit (HOSPITAL_COMMUNITY): Payer: Self-pay | Admitting: Interventional Radiology

## 2013-05-27 DIAGNOSIS — M549 Dorsalgia, unspecified: Secondary | ICD-10-CM

## 2013-05-27 DIAGNOSIS — IMO0002 Reserved for concepts with insufficient information to code with codable children: Secondary | ICD-10-CM

## 2013-05-27 NOTE — Telephone Encounter (Signed)
Called pt's daughter and left VM to let them know we needed to cancel appt for 05/28/13 and reschedule for 05/31/13, same time just different day. JM

## 2013-05-27 NOTE — Telephone Encounter (Signed)
Called pt's daughter's cell phone, left VM for her to let her know that the appt for 05/28/13 for her father has to be rescheduled for 05/31/13. JM

## 2013-05-27 NOTE — Telephone Encounter (Signed)
Called pt's daughter back and left another VM for her to call Sherald HessMelinda IR Supervisor on Friday 05/28/13 am after 8:30 to reschedule father's appt. JM Left this VM on both daughter's cell and home phone!

## 2013-05-27 NOTE — Telephone Encounter (Signed)
Made appt for 05/28/13, called pt back to reschedule for 05/31/13. I have made multiple attempts to reach patient and have gotten no answer and no voice mail to let them know the appointment needs to be moved to 05/31/13. I will continue to try to call them. JM

## 2013-05-28 ENCOUNTER — Ambulatory Visit (HOSPITAL_COMMUNITY): Admission: RE | Admit: 2013-05-28 | Payer: PRIVATE HEALTH INSURANCE | Source: Ambulatory Visit

## 2013-05-31 ENCOUNTER — Encounter (HOSPITAL_COMMUNITY): Payer: Self-pay

## 2013-05-31 ENCOUNTER — Ambulatory Visit (HOSPITAL_COMMUNITY)
Admission: RE | Admit: 2013-05-31 | Discharge: 2013-05-31 | Disposition: A | Discharge status: Home / Self Care | Payer: PRIVATE HEALTH INSURANCE | Source: Ambulatory Visit | Attending: Interventional Radiology | Admitting: Interventional Radiology

## 2013-05-31 DIAGNOSIS — H3530 Unspecified macular degeneration: Secondary | ICD-10-CM | POA: Insufficient documentation

## 2013-05-31 DIAGNOSIS — S22009A Unspecified fracture of unspecified thoracic vertebra, initial encounter for closed fracture: Secondary | ICD-10-CM | POA: Insufficient documentation

## 2013-05-31 DIAGNOSIS — M549 Dorsalgia, unspecified: Secondary | ICD-10-CM

## 2013-05-31 DIAGNOSIS — I10 Essential (primary) hypertension: Secondary | ICD-10-CM | POA: Insufficient documentation

## 2013-05-31 DIAGNOSIS — I4891 Unspecified atrial fibrillation: Secondary | ICD-10-CM | POA: Insufficient documentation

## 2013-05-31 DIAGNOSIS — Z882 Allergy status to sulfonamides status: Secondary | ICD-10-CM | POA: Insufficient documentation

## 2013-05-31 DIAGNOSIS — IMO0002 Reserved for concepts with insufficient information to code with codable children: Secondary | ICD-10-CM

## 2013-05-31 DIAGNOSIS — E119 Type 2 diabetes mellitus without complications: Secondary | ICD-10-CM | POA: Insufficient documentation

## 2013-05-31 DIAGNOSIS — W19XXXA Unspecified fall, initial encounter: Secondary | ICD-10-CM | POA: Insufficient documentation

## 2013-05-31 LAB — CBC WITH DIFFERENTIAL/PLATELET
Basophils Absolute: 0.1 10*3/uL (ref 0.0–0.1)
Basophils Relative: 1 % (ref 0–1)
EOS ABS: 0.5 10*3/uL (ref 0.0–0.7)
EOS PCT: 4 % (ref 0–5)
HEMATOCRIT: 36.4 % — AB (ref 39.0–52.0)
Hemoglobin: 12.1 g/dL — ABNORMAL LOW (ref 13.0–17.0)
LYMPHS ABS: 1.3 10*3/uL (ref 0.7–4.0)
LYMPHS PCT: 11 % — AB (ref 12–46)
MCH: 31.8 pg (ref 26.0–34.0)
MCHC: 33.2 g/dL (ref 30.0–36.0)
MCV: 95.5 fL (ref 78.0–100.0)
MONO ABS: 1.1 10*3/uL — AB (ref 0.1–1.0)
MONOS PCT: 9 % (ref 3–12)
Neutro Abs: 8.7 10*3/uL — ABNORMAL HIGH (ref 1.7–7.7)
Neutrophils Relative %: 75 % (ref 43–77)
Platelets: 324 10*3/uL (ref 150–400)
RBC: 3.81 MIL/uL — AB (ref 4.22–5.81)
RDW: 13.8 % (ref 11.5–15.5)
WBC: 11.5 10*3/uL — AB (ref 4.0–10.5)

## 2013-05-31 LAB — BASIC METABOLIC PANEL
BUN: 24 mg/dL — ABNORMAL HIGH (ref 6–23)
CHLORIDE: 102 meq/L (ref 96–112)
CO2: 26 meq/L (ref 19–32)
Calcium: 9.7 mg/dL (ref 8.4–10.5)
Creatinine, Ser: 1.35 mg/dL (ref 0.50–1.35)
GFR calc Af Amer: 51 mL/min — ABNORMAL LOW (ref >90)
GFR calc non Af Amer: 44 mL/min — ABNORMAL LOW (ref >90)
Glucose, Bld: 128 mg/dL — ABNORMAL HIGH (ref 70–99)
Potassium: 5.7 mEq/L — ABNORMAL HIGH (ref 3.7–5.3)
SODIUM: 140 meq/L (ref 137–147)

## 2013-05-31 LAB — GLUCOSE, CAPILLARY: Glucose-Capillary: 94 mg/dL (ref 70–99)

## 2013-05-31 LAB — APTT: aPTT: 28 seconds (ref 24–37)

## 2013-05-31 LAB — PROTIME-INR
INR: 1.16 (ref 0.00–1.49)
Prothrombin Time: 14.6 seconds (ref 11.6–15.2)

## 2013-05-31 MED ORDER — SODIUM CHLORIDE 0.9 % IV SOLN: INTRAVENOUS | Status: AC

## 2013-05-31 MED ORDER — TOBRAMYCIN SULFATE 1.2 G IJ SOLR
INTRAMUSCULAR | Status: DC
Start: 2013-05-31 — End: 2013-06-01
  Filled 2013-05-31: qty 1.2

## 2013-05-31 MED ORDER — SODIUM CHLORIDE 0.9 % IV SOLN
INTRAVENOUS | Status: DC
  Administered 2013-05-31: 12:00:00 via INTRAVENOUS

## 2013-05-31 MED ORDER — MIDAZOLAM HCL 2 MG/2ML IJ SOLN
INTRAMUSCULAR | Status: AC | PRN
  Administered 2013-05-31: 0.5 mg via INTRAVENOUS
  Administered 2013-05-31: 1 mg via INTRAVENOUS

## 2013-05-31 MED ORDER — FENTANYL CITRATE 0.05 MG/ML IJ SOLN
INTRAMUSCULAR | Status: DC
Start: 2013-05-31 — End: 2013-06-01
  Filled 2013-05-31: qty 2

## 2013-05-31 MED ORDER — CEFAZOLIN SODIUM-DEXTROSE 2-3 GM-% IV SOLR
2.0000 g | Freq: Once | INTRAVENOUS | Status: AC
  Administered 2013-05-31: 2 g via INTRAVENOUS

## 2013-05-31 MED ORDER — FENTANYL CITRATE 0.05 MG/ML IJ SOLN
INTRAMUSCULAR | Status: AC | PRN
  Administered 2013-05-31: 25 ug via INTRAVENOUS
  Administered 2013-05-31: 12.5 ug via INTRAVENOUS

## 2013-05-31 MED ORDER — CEFAZOLIN SODIUM-DEXTROSE 2-3 GM-% IV SOLR
INTRAVENOUS | Status: AC
  Filled 2013-05-31: qty 50

## 2013-05-31 MED ORDER — MIDAZOLAM HCL 2 MG/2ML IJ SOLN
INTRAMUSCULAR | Status: DC
Start: 2013-05-31 — End: 2013-06-01
  Filled 2013-05-31: qty 4

## 2013-05-31 NOTE — ED Notes (Signed)
Short stay notified for bed 

## 2013-05-31 NOTE — Discharge Instructions (Signed)
Use walker to ambulate for 2 weeks. No stooping,bending or lifting more than 10 lbs for 2 weeks. RTC in 2 weeks. KYPHOPLASTY/VERTEBROPLASTY DISCHARGE INSTRUCTIONS  Medications: (check all that apply)     Resume all home medications as before procedure.                     Continue your pain medications as prescribed as needed.  Over the next 3-5 days, decrease your pain medication as tolerated.  Over the counter medications (i.e. Tylenol, ibuprofen, and aleve) may be substituted once severe/moderate pain symptoms have subsided.   Wound Care:   Bandages may be removed the day following your procedure.  You may get your incision wet once bandages are removed.  Bandaids may be used to cover the incisions until scab formation.  Topical ointments are optional.    If you develop a fever greater than 101 degrees, have increased skin redness at the incision sites or pus-like oozing from incisions occurring within 1 week of the procedure, contact radiology at [phone] or [phone].    Ice pack to back for 15-20 minutes 2-3 time per day for first 2-3 days post procedure.  The ice will expedite muscle healing and help with the pain from the incisions.   Activity:   Bedrest today with limited activity for 24 hours post procedure.    No driving for 48 hours.    Increase your activity as tolerated after bedrest (with assistance if necessary).    Refrain from any strenuous activity or heavy lifting (greater than 10 lbs.).   Follow up:   Contact radiology at [phone] or [phone] if any questions/concerns.    A physician assistant from radiology will contact you in approximately 1 week.    If a biopsy was performed at the time of your procedure, your referring physician should receive the results in usually 2-3 days.

## 2013-05-31 NOTE — Procedures (Signed)
S/P T 12 KP with biopsy 

## 2013-05-31 NOTE — H&P (Signed)
Clayton SkyeWilliam G [name] is an 80 y.o. male.   Chief Complaint: Pt with new onset back pain after fall at home 05/13/2013 Pt has been using walker since fall Pan is in low back to Rt Ranks pain "5-6" on scale of 10 Using Ibuprofen with relief Was seen in ER where plain films showed possible fx MRI later performed reveals new Thoracic 12 fracture Had a Lumbar 1 kyphoplasty 03/2013 and did well. Scheduled now for T12 KP  HPI: macular degeneration; HTN; DM; afib; using Amox po at home for infected tooth since 05/27/2013   Past Medical History  Diagnosis Date  . Macular degeneration   . Hypertension   . Diabetes mellitus without complication   . paroxysmal afib     Past Surgical History  Procedure Laterality Date  . Orif humerus fracture Right 06/26/2012    Procedure: OPEN REDUCTION INTERNAL FIXATION (ORIF) PROXIMAL HUMERUS FRACTURE, right ;  Surgeon: Budd PalmerMichael H Handy, MD;  Location: [facility] OR;  Service: Orthopedics;  Laterality: Right;    No family history on file. Social History:  reports that he does not drink alcohol or use illicit drugs. His tobacco history is not on file.  Allergies:  Allergies  Allergen Reactions  . Sulfa Antibiotics Itching     (Not in a hospital admission)  Results for orders placed during the hospital encounter of 05/31/13 (from the past 48 hour(s))  CBC WITH DIFFERENTIAL     Status: Abnormal   Collection Time    05/31/13 11:35 AM      Result Value Range   WBC 11.5 (*) 4.0 - 10.5 K/uL   RBC 3.81 (*) 4.22 - 5.81 MIL/uL   Hemoglobin 12.1 (*) 13.0 - 17.0 g/dL   HCT 16.136.4 (*) 09.639.0 - 04.552.0 %   MCV 95.5  78.0 - 100.0 fL   MCH 31.8  26.0 - 34.0 pg   MCHC 33.2  30.0 - 36.0 g/dL   RDW 40.913.8  81.111.5 - 91.415.5 %   Platelets 324  150 - 400 K/uL   Neutrophils Relative % 75  43 - 77 %   Neutro Abs 8.7 (*) 1.7 - 7.7 K/uL   Lymphocytes Relative 11 (*) 12 - 46 %   Lymphs Abs 1.3  0.7 - 4.0 K/uL   Monocytes Relative 9  3 - 12 %   Monocytes Absolute 1.1 (*) 0.1 - 1.0 K/uL   Eosinophils Relative 4  0 - 5 %   Eosinophils Absolute 0.5  0.0 - 0.7 K/uL   Basophils Relative 1  0 - 1 %   Basophils Absolute 0.1  0.0 - 0.1 K/uL   No results found.  Review of Systems  Constitutional: Negative for fever and weight loss.  Respiratory: Negative for shortness of breath.   Cardiovascular: Negative for chest pain.  Gastrointestinal: Negative for nausea, vomiting and abdominal pain.  Musculoskeletal: Positive for back pain.  Neurological: Positive for weakness. Negative for dizziness.  Psychiatric/Behavioral: Negative for substance abuse.    Blood pressure 160/70, pulse 74, temperature 98.4 F (36.9 C), temperature source Oral, resp. rate 18, height 5\' 11"  (1.803 m), weight 150 lb (68.04 kg), SpO2 98.00%. Physical Exam  Constitutional: He is oriented to person, place, and time. He appears well-nourished.  Cardiovascular: Normal rate and regular rhythm.   No murmur heard. Respiratory: Effort normal and breath sounds normal. He has no wheezes.  GI: Soft. Bowel sounds are normal. There is no tenderness.  Musculoskeletal: Normal range of motion.  Pain low back  Neurological: He is alert and oriented to person, place, and time. Coordination normal.  Skin: Skin is warm and dry.  Psychiatric: He has a normal mood and affect. His behavior is normal. Judgment and thought content normal.     Assessment/Plan New T12 painful fracture after fall at home 05/13/2013 MRI confirms new fx Scheduled now for T12 KP Pt and family aware of procedure benefits and risks and agreeable to proceed Consent signed and in chart Pt has had L1 KP 03/2013--did well.  Donel Osowski A 05/31/2013, 12:05 PM

## 2013-06-16 ENCOUNTER — Other Ambulatory Visit (HOSPITAL_COMMUNITY): Payer: Self-pay | Admitting: Interventional Radiology

## 2013-06-16 DIAGNOSIS — IMO0002 Reserved for concepts with insufficient information to code with codable children: Secondary | ICD-10-CM

## 2013-06-16 DIAGNOSIS — M549 Dorsalgia, unspecified: Secondary | ICD-10-CM

## 2013-06-17 ENCOUNTER — Ambulatory Visit (HOSPITAL_COMMUNITY)
Admission: RE | Admit: 2013-06-17 | Discharge: 2013-06-17 | Disposition: A | Discharge status: Home / Self Care | Payer: PRIVATE HEALTH INSURANCE | Source: Ambulatory Visit | Attending: Interventional Radiology | Admitting: Interventional Radiology

## 2013-06-17 DIAGNOSIS — M549 Dorsalgia, unspecified: Secondary | ICD-10-CM

## 2013-06-17 DIAGNOSIS — IMO0002 Reserved for concepts with insufficient information to code with codable children: Secondary | ICD-10-CM

## 2013-12-01 ENCOUNTER — Other Ambulatory Visit (HOSPITAL_COMMUNITY): Payer: Self-pay | Admitting: Interventional Radiology

## 2013-12-01 DIAGNOSIS — M549 Dorsalgia, unspecified: Secondary | ICD-10-CM

## 2013-12-03 ENCOUNTER — Ambulatory Visit (HOSPITAL_COMMUNITY): Admission: RE | Admit: 2013-12-03 | Payer: PRIVATE HEALTH INSURANCE | Source: Ambulatory Visit

## 2013-12-14 ENCOUNTER — Other Ambulatory Visit (HOSPITAL_COMMUNITY): Payer: Self-pay | Admitting: Interventional Radiology

## 2013-12-14 DIAGNOSIS — IMO0002 Reserved for concepts with insufficient information to code with codable children: Secondary | ICD-10-CM

## 2013-12-14 DIAGNOSIS — M549 Dorsalgia, unspecified: Secondary | ICD-10-CM

## 2013-12-20 ENCOUNTER — Ambulatory Visit (HOSPITAL_COMMUNITY)
Admission: RE | Admit: 2013-12-20 | Discharge: 2013-12-20 | Disposition: A | Discharge status: Home / Self Care | Payer: PRIVATE HEALTH INSURANCE | Source: Ambulatory Visit | Attending: Interventional Radiology | Admitting: Interventional Radiology

## 2013-12-20 DIAGNOSIS — M549 Dorsalgia, unspecified: Secondary | ICD-10-CM

## 2013-12-20 DIAGNOSIS — IMO0002 Reserved for concepts with insufficient information to code with codable children: Secondary | ICD-10-CM

## 2013-12-20 DIAGNOSIS — M5137 Other intervertebral disc degeneration, lumbosacral region: Secondary | ICD-10-CM | POA: Insufficient documentation

## 2013-12-20 DIAGNOSIS — M8448XA Pathological fracture, other site, initial encounter for fracture: Secondary | ICD-10-CM | POA: Diagnosis not present

## 2013-12-20 DIAGNOSIS — M51379 Other intervertebral disc degeneration, lumbosacral region without mention of lumbar back pain or lower extremity pain: Secondary | ICD-10-CM | POA: Insufficient documentation

## 2013-12-23 ENCOUNTER — Telehealth (HOSPITAL_COMMUNITY): Payer: Self-pay | Admitting: Interventional Radiology

## 2013-12-23 NOTE — Telephone Encounter (Signed)
Called pt, told him there were no new fractures on his recent MR study and that he should see his PCP. He states understanding and is in agreement with this plan of care. JM

## 2014-11-15 ENCOUNTER — Encounter: Payer: Self-pay | Admitting: Podiatry

## 2014-11-15 ENCOUNTER — Telehealth: Payer: Self-pay | Admitting: *Deleted

## 2014-11-15 ENCOUNTER — Ambulatory Visit (INDEPENDENT_AMBULATORY_CARE_PROVIDER_SITE_OTHER): Payer: Medicare Other | Admitting: Podiatry

## 2014-11-15 VITALS — BP 135/64 | HR 78 | Resp 12

## 2014-11-15 DIAGNOSIS — B351 Tinea unguium: Secondary | ICD-10-CM | POA: Diagnosis not present

## 2014-11-15 DIAGNOSIS — R0989 Other specified symptoms and signs involving the circulatory and respiratory systems: Secondary | ICD-10-CM

## 2014-11-15 NOTE — Progress Notes (Signed)
   Subjective:    Patient ID: Clayton [name], Clayton [name]    DOB: October 30, 1919, 81 y.o.   MRN: [id number]  HPI PT REQUESTING FOR TOENAILS DEBRIDEMENT. This request was made by patient's daughter who is in the treatment room. Patient is had no recent podiatric care  Review of Systems  HENT: Positive for hearing loss.   Eyes: Positive for visual disturbance.  Musculoskeletal: Positive for back pain and joint swelling.   Patient is history of skin ulceration or claudication        Objective:   Physical Exam  Patient appears orientated 3  Vascular: DP right 2/4 DP left 0/4 PT pulses 0/4 bilaterally  Neurological: Sensation to 10 g monofilament wire intact 4/5 right and 5/5 left Vibratory sensation nonreactive bilaterally Ankle reflex reactive bilaterally  Dermatological: Hallux nails bilaterally and fifth left nail are hypertrophic, elongated, discolored and tender to direct palpation The remaining toenails are normal trophic and mildly incurvated  Musculoskeletal: Bunionette's bilaterally There is no restriction ankle, subtalar, midtarsal joints bilaterally       Assessment & Plan:   Assessment: Decrease pedal pulses suggestive of possible peripheral arterial disease Protective sensation intact bilaterally Symptomatic mycotic toenails 79 Asymptomatic incurvated toenails 79  Plan: Review the results of examination with patient and daughter. I am recommending a lower extremity arterial Doppler for the indication of diabetes and decrease pedal pulses Symptomatic mycotic toenails 79 debrided mechanically and likely without a bleeding Debridement of remaining toenails  Contact vascular lab schedule arterial Doppler and notify patient of results

## 2014-11-15 NOTE — Telephone Encounter (Signed)
Dr[facility] Lee[facility]pp[facility] B/L diminished pulses and diabetes.

## 2014-11-15 NOTE — Patient Instructions (Signed)
The vascular lab we'll contact you to schedule a lower extremity arterial Doppler to check the circulation in your legs and feet Will notify you of the results of the lab Will reschedule you for nail debridement and 3 months  Diabetes and Foot Care Diabetes may cause you to have problems because of poor blood supply (circulation) to your feet and legs. This may cause the skin on your feet to become thinner, break easier, and heal more slowly. Your skin may become dry, and the skin may peel and crack. You may also have nerve damage in your legs and feet causing decreased feeling in them. You may not notice minor injuries to your feet that could lead to infections or more serious problems. Taking care of your feet is one of the most important things you can do for yourself.  HOME CARE INSTRUCTIONS  Wear shoes at all times, even in the house. Do not go barefoot. Bare feet are easily injured.  Check your feet daily for blisters, cuts, and redness. If you cannot see the bottom of your feet, use a mirror or ask someone for help.  Wash your feet with warm water (do not use hot water) and mild soap. Then pat your feet and the areas between your toes until they are completely dry. Do not soak your feet as this can dry your skin.  Apply a moisturizing lotion or petroleum jelly (that does not contain alcohol and is unscented) to the skin on your feet and to dry, brittle toenails. Do not apply lotion between your toes.  Trim your toenails straight across. Do not dig under them or around the cuticle. File the edges of your nails with an emery board or nail file.  Do not cut corns or calluses or try to remove them with medicine.  Wear clean socks or stockings every day. Make sure they are not too tight. Do not wear knee-high stockings since they may decrease blood flow to your legs.  Wear shoes that fit properly and have enough cushioning. To break in new shoes, wear them for just a few hours a day. This  prevents you from injuring your feet. Always look in your shoes before you put them on to be sure there are no objects inside.  Do not cross your legs. This may decrease the blood flow to your feet.  If you find a minor scrape, cut, or break in the skin on your feet, keep it and the skin around it clean and dry. These areas may be cleansed with mild soap and water. Do not cleanse the area with peroxide, alcohol, or iodine.  When you remove an adhesive bandage, be sure not to damage the skin around it.  If you have a wound, look at it several times a day to make sure it is healing.  Do not use heating pads or hot water bottles. They may burn your skin. If you have lost feeling in your feet or legs, you may not know it is happening until it is too late.  Make sure your health care provider performs a complete foot exam at least annually or more often if you have foot problems. Report any cuts, sores, or bruises to your health care provider immediately. SEEK MEDICAL CARE IF:   You have an injury that is not healing.  You have cuts or breaks in the skin.  You have an ingrown nail.  You notice redness on your legs or feet.  You feel burning or  tingling in your legs or feet.  You have pain or cramps in your legs and feet.  Your legs or feet are numb.  Your feet always feel cold. SEEK IMMEDIATE MEDICAL CARE IF:   There is increasing redness, swelling, or pain in or around a wound.  There is a red line that goes up your leg.  Pus is coming from a wound.  You develop a fever or as directed by your health care provider.  You notice a bad smell coming from an ulcer or wound. Document Released: 04/19/2000 Document Revised: 12/23/2012 Document Reviewed: 09/29/2012 [facility] Patient Information 2015 [place], [place]. This information is not intended to replace advice given to you by your health care provider. Make sure you discuss any questions you have with your health care provider.

## 2014-11-16 ENCOUNTER — Encounter (HOSPITAL_COMMUNITY): Payer: Medicare (Managed Care)

## 2014-11-18 ENCOUNTER — Other Ambulatory Visit: Payer: Self-pay | Admitting: Podiatry

## 2014-11-18 ENCOUNTER — Ambulatory Visit (HOSPITAL_COMMUNITY): Payer: Medicare Other | Attending: Podiatry

## 2014-11-18 DIAGNOSIS — R0989 Other specified symptoms and signs involving the circulatory and respiratory systems: Secondary | ICD-10-CM | POA: Diagnosis present

## 2014-11-18 DIAGNOSIS — I739 Peripheral vascular disease, unspecified: Secondary | ICD-10-CM | POA: Diagnosis not present

## 2014-11-18 DIAGNOSIS — I700 Atherosclerosis of aorta: Secondary | ICD-10-CM | POA: Insufficient documentation

## 2014-11-18 DIAGNOSIS — I714 Abdominal aortic aneurysm, without rupture: Secondary | ICD-10-CM | POA: Insufficient documentation

## 2014-12-07 ENCOUNTER — Telehealth: Payer: Self-pay | Admitting: *Deleted

## 2014-12-07 DIAGNOSIS — R0989 Other specified symptoms and signs involving the circulatory and respiratory systems: Secondary | ICD-10-CM

## 2014-12-07 NOTE — Telephone Encounter (Signed)
Pt's dtr, Alfonse Flavors request pt's dopplers.

## 2014-12-08 NOTE — Telephone Encounter (Addendum)
-----   Message from Carrington Clamp, DPM sent at 12/08/2014 12:14 PM EDT ----- Lower extremity arterial Doppler on 11/23/2014 Bilateral ABIs are normal range, falsely elevated on right due to medial calcification Bilateral great toe pressures are abnormal  Contact Dr. Wilburt Finlay or Dr. Allyson Sabal and ask if they want any follow-up on this vascular examination and then contact patient.  Left message informing pt's dtr, Ms Langston Masker of Dr. Theotis Burrow referral to Dr. Allyson Sabal for consult for abnormal dopplers.  Ms. Langston Masker called for callback.  I left a message to call again.

## 2014-12-28 ENCOUNTER — Ambulatory Visit (INDEPENDENT_AMBULATORY_CARE_PROVIDER_SITE_OTHER): Payer: Medicare Other | Admitting: Cardiovascular Disease

## 2014-12-28 ENCOUNTER — Encounter: Payer: Self-pay | Admitting: Cardiovascular Disease

## 2014-12-28 VITALS — BP 112/64 | HR 71 | Ht 71.0 in | Wt 138.7 lb

## 2014-12-28 DIAGNOSIS — I4510 Unspecified right bundle-branch block: Secondary | ICD-10-CM | POA: Diagnosis not present

## 2014-12-28 DIAGNOSIS — I10 Essential (primary) hypertension: Secondary | ICD-10-CM

## 2014-12-28 DIAGNOSIS — R0989 Other specified symptoms and signs involving the circulatory and respiratory systems: Secondary | ICD-10-CM

## 2014-12-28 NOTE — Patient Instructions (Signed)
Follow up with Dr Berry as needed.  

## 2014-12-28 NOTE — Assessment & Plan Note (Signed)
Clayton [name] is referred by Dr. Leeanne Deed, his podiatrist, because of nonpalpable pedal pulses on exam or history of diabetes. The patient does not have any open wounds. He denies claudication but does have some symptoms of diabetic peripheral neuropathy. Dopplers performed 11/18/14 revealed normal ABIs bilaterally with normal velocities suggesting that he has no significant PAD. No further workup is required at this time

## 2014-12-28 NOTE — Progress Notes (Signed)
12/28/2014 Clayton [name]   Feb 03, 1920  [id number]  Primary Physician Clayton Sites, MD Primary Cardiologist: Clayton Gess MD Clayton [name]   HPI:  Clayton [name] is a delightful 81 year old married Caucasian male father of 3 children, grandfather for grandchildren was cupping by his daughter Clayton [name] today. He is referred through the courtesy of Dr. Leeanne [name] for peripheral vascular evaluation because of absent pedal pulses on exam. He is a retired Hydrologist. His primary care physician is Dr. Adela [name] . He smoked remotely and has history of type 2 diabetes on metformin and hypertension on an ACE inhibitor. He has never had a heart attack or stroke. Denies chest pain or shortness of breath. Apparently on exam Dr. Leeanne [name] did not appreciate pedal pulses. Dopplers performed in our office 11/18/14 revealed normal ABIs with no evidence of obstructive disease. Had a small abdominal aortic aneurysm measuring 3 cm.   Current Outpatient Prescriptions  Medication Sig Dispense Refill  . ibuprofen (ADVIL,MOTRIN) 800 MG tablet Take 800 mg by mouth as needed (pain).     Marland Kitchen lisinopril (PRINIVIL,ZESTRIL) 20 MG tablet Take 20 mg by mouth daily at 12 noon.     . metFORMIN (GLUCOPHAGE-XR) 500 MG 24 hr tablet Take 500 mg by mouth daily with breakfast.    . Multiple Vitamins-Minerals (ICAPS PO) Take 1 capsule by mouth daily.     No current facility-administered medications for this visit.    Allergies  Allergen Reactions  . Sulfa Antibiotics Itching    Social History   Social History  . Marital Status: Married    Spouse Name: N/A  . Number of Children: N/A  . Years of Education: N/A   Occupational History  . Not on file.   Social History Main Topics  . Smoking status: Never Smoker   . Smokeless tobacco: Not on file  . Alcohol Use: No  . Drug Use: No  . Sexual Activity: Not on file   Other Topics Concern  . Not on file   Social History Narrative     Review  of Systems: General: negative for chills, fever, night sweats or weight changes.  Cardiovascular: negative for chest pain, dyspnea on exertion, edema, orthopnea, palpitations, paroxysmal nocturnal dyspnea or shortness of breath Dermatological: negative for rash Respiratory: negative for cough or wheezing Urologic: negative for hematuria Abdominal: negative for nausea, vomiting, diarrhea, bright red blood per rectum, melena, or hematemesis Neurologic: negative for visual changes, syncope, or dizziness All other systems reviewed and are otherwise negative except as noted above.    Blood pressure 112/64, pulse 71, height  (1.803 m), weight 138 lb 11.2 oz (62.914 kg).  General appearance: alert and no distress Neck: no adenopathy, no carotid bruit, no JVD, supple, symmetrical, trachea midline and thyroid not enlarged, symmetric, no tenderness/mass/nodules Lungs: clear to auscultation bilaterally Heart: regular rate and rhythm, S1, S2 normal, no murmur, click, rub or gallop Extremities: extremities normal, atraumatic, no cyanosis or edema and 1+ pedal pulses  EKG normal sinus rhythm at 71 with right bundle branch block. I personally reviewed this EKG  ASSESSMENT AND PLAN:   Absent pedal pulses Clayton [name] is referred by Dr. Leeanne [name], his podiatrist, because of nonpalpable pedal pulses on exam or history of diabetes. The patient does not have any open wounds. He denies claudication but does have some symptoms of diabetic peripheral neuropathy. Dopplers performed 11/18/14 revealed normal ABIs bilaterally with normal velocities suggesting that he has no significant PAD. No further workup is  required at this time      Clayton Gess MD [facility], [facility] 12/28/2014 11:56 AM

## 2015-02-07 ENCOUNTER — Emergency Department (HOSPITAL_COMMUNITY): Payer: Medicare Other

## 2015-02-07 ENCOUNTER — Inpatient Hospital Stay (HOSPITAL_COMMUNITY): Payer: Medicare Other

## 2015-02-07 ENCOUNTER — Inpatient Hospital Stay (HOSPITAL_COMMUNITY)
Admission: EM | Admit: 2015-02-07 | Discharge: 2015-02-08 | DRG: 312 | Disposition: A | Discharge status: Home / Self Care | Payer: Medicare Other | Attending: Internal Medicine | Admitting: Internal Medicine

## 2015-02-07 ENCOUNTER — Encounter (HOSPITAL_COMMUNITY): Payer: Self-pay | Admitting: Emergency Medicine

## 2015-02-07 DIAGNOSIS — E860 Dehydration: Secondary | ICD-10-CM | POA: Diagnosis present

## 2015-02-07 DIAGNOSIS — D509 Iron deficiency anemia, unspecified: Secondary | ICD-10-CM | POA: Diagnosis present

## 2015-02-07 DIAGNOSIS — I480 Paroxysmal atrial fibrillation: Secondary | ICD-10-CM | POA: Diagnosis present

## 2015-02-07 DIAGNOSIS — Z66 Do not resuscitate: Secondary | ICD-10-CM | POA: Diagnosis present

## 2015-02-07 DIAGNOSIS — Z79899 Other long term (current) drug therapy: Secondary | ICD-10-CM | POA: Diagnosis not present

## 2015-02-07 DIAGNOSIS — N183 Chronic kidney disease, stage 3 unspecified: Secondary | ICD-10-CM | POA: Diagnosis present

## 2015-02-07 DIAGNOSIS — E1122 Type 2 diabetes mellitus with diabetic chronic kidney disease: Secondary | ICD-10-CM | POA: Diagnosis present

## 2015-02-07 DIAGNOSIS — D649 Anemia, unspecified: Secondary | ICD-10-CM | POA: Diagnosis present

## 2015-02-07 DIAGNOSIS — I499 Cardiac arrhythmia, unspecified: Secondary | ICD-10-CM | POA: Insufficient documentation

## 2015-02-07 DIAGNOSIS — N289 Disorder of kidney and ureter, unspecified: Secondary | ICD-10-CM

## 2015-02-07 DIAGNOSIS — E119 Type 2 diabetes mellitus without complications: Secondary | ICD-10-CM

## 2015-02-07 DIAGNOSIS — N179 Acute kidney failure, unspecified: Secondary | ICD-10-CM | POA: Diagnosis present

## 2015-02-07 DIAGNOSIS — H3530 Unspecified macular degeneration: Secondary | ICD-10-CM | POA: Diagnosis present

## 2015-02-07 DIAGNOSIS — I10 Essential (primary) hypertension: Secondary | ICD-10-CM | POA: Diagnosis not present

## 2015-02-07 DIAGNOSIS — E875 Hyperkalemia: Secondary | ICD-10-CM | POA: Diagnosis present

## 2015-02-07 DIAGNOSIS — I129 Hypertensive chronic kidney disease with stage 1 through stage 4 chronic kidney disease, or unspecified chronic kidney disease: Secondary | ICD-10-CM | POA: Diagnosis present

## 2015-02-07 DIAGNOSIS — R55 Syncope and collapse: Principal | ICD-10-CM | POA: Diagnosis present

## 2015-02-07 HISTORY — DX: Chronic kidney disease, stage 3 (moderate): N18.3

## 2015-02-07 HISTORY — DX: Chronic kidney disease, stage 3 unspecified: N18.30

## 2015-02-07 LAB — URINALYSIS, ROUTINE W REFLEX MICROSCOPIC
BILIRUBIN URINE: NEGATIVE
GLUCOSE, UA: NEGATIVE mg/dL
HGB URINE DIPSTICK: NEGATIVE
Ketones, ur: NEGATIVE mg/dL
Nitrite: NEGATIVE
PROTEIN: NEGATIVE mg/dL
Specific Gravity, Urine: 1.011 (ref 1.005–1.030)
UROBILINOGEN UA: 0.2 mg/dL (ref 0.0–1.0)
pH: 6 (ref 5.0–8.0)

## 2015-02-07 LAB — CBC WITH DIFFERENTIAL/PLATELET
BASOS PCT: 1 %
Basophils Absolute: 0.1 10*3/uL (ref 0.0–0.1)
EOS ABS: 0.5 10*3/uL (ref 0.0–0.7)
EOS PCT: 5 %
HCT: 25.2 % — ABNORMAL LOW (ref 39.0–52.0)
Hemoglobin: 8 g/dL — ABNORMAL LOW (ref 13.0–17.0)
LYMPHS ABS: 0.8 10*3/uL (ref 0.7–4.0)
Lymphocytes Relative: 8 %
MCH: 29.7 pg (ref 26.0–34.0)
MCHC: 31.7 g/dL (ref 30.0–36.0)
MCV: 93.7 fL (ref 78.0–100.0)
MONO ABS: 1.1 10*3/uL — AB (ref 0.1–1.0)
MONOS PCT: 12 %
NEUTROS PCT: 74 %
Neutro Abs: 6.7 10*3/uL (ref 1.7–7.7)
Platelets: 219 10*3/uL (ref 150–400)
RBC: 2.69 MIL/uL — ABNORMAL LOW (ref 4.22–5.81)
RDW: 13.3 % (ref 11.5–15.5)
WBC: 9 10*3/uL (ref 4.0–10.5)

## 2015-02-07 LAB — CBC
HEMATOCRIT: 29 % — AB (ref 39.0–52.0)
Hemoglobin: 9.3 g/dL — ABNORMAL LOW (ref 13.0–17.0)
MCH: 29.8 pg (ref 26.0–34.0)
MCHC: 32.1 g/dL (ref 30.0–36.0)
MCV: 92.9 fL (ref 78.0–100.0)
Platelets: 215 10*3/uL (ref 150–400)
RBC: 3.12 MIL/uL — ABNORMAL LOW (ref 4.22–5.81)
RDW: 13.2 % (ref 11.5–15.5)
WBC: 10.6 10*3/uL — ABNORMAL HIGH (ref 4.0–10.5)

## 2015-02-07 LAB — BASIC METABOLIC PANEL
Anion gap: 8 (ref 5–15)
BUN: 35 mg/dL — AB (ref 6–20)
CO2: 21 mmol/L — AB (ref 22–32)
CREATININE: 1.52 mg/dL — AB (ref 0.61–1.24)
Calcium: 8.4 mg/dL — ABNORMAL LOW (ref 8.9–10.3)
Chloride: 107 mmol/L (ref 101–111)
GFR calc non Af Amer: 38 mL/min — ABNORMAL LOW (ref >60)
GFR, EST AFRICAN AMERICAN: 44 mL/min — AB (ref >60)
GLUCOSE: 280 mg/dL — AB (ref 65–99)
Potassium: 5.2 mmol/L — ABNORMAL HIGH (ref 3.5–5.1)
Sodium: 136 mmol/L (ref 135–145)

## 2015-02-07 LAB — URINE MICROSCOPIC-ADD ON

## 2015-02-07 LAB — RETICULOCYTES
RBC.: 3.13 MIL/uL — ABNORMAL LOW (ref 4.22–5.81)
Retic Count, Absolute: 40.7 10*3/uL (ref 19.0–186.0)
Retic Ct Pct: 1.3 % (ref 0.4–3.1)

## 2015-02-07 LAB — APTT: APTT: 27 s (ref 24–37)

## 2015-02-07 LAB — POC OCCULT BLOOD, ED: FECAL OCCULT BLD: NEGATIVE

## 2015-02-07 LAB — PROTIME-INR
INR: 1.14 (ref 0.00–1.49)
Prothrombin Time: 14.8 seconds (ref 11.6–15.2)

## 2015-02-07 MED ORDER — ACETAMINOPHEN 650 MG RE SUPP: 650.0000 mg | Freq: Four times a day (QID) | RECTAL | Status: DC | PRN

## 2015-02-07 MED ORDER — SODIUM POLYSTYRENE SULFONATE 15 GM/60ML PO SUSP
15.0000 g | Freq: Once | ORAL | Status: AC
  Administered 2015-02-07: 15 g via ORAL
  Filled 2015-02-07: qty 60

## 2015-02-07 MED ORDER — SODIUM CHLORIDE 0.9 % IV SOLN
INTRAVENOUS | Status: DC
  Administered 2015-02-08 (×2): via INTRAVENOUS

## 2015-02-07 MED ORDER — SODIUM CHLORIDE 0.9 % IV SOLN: INTRAVENOUS | Status: DC

## 2015-02-07 MED ORDER — INSULIN ASPART 100 UNIT/ML ~~LOC~~ SOLN
0.0000 [IU] | Freq: Three times a day (TID) | SUBCUTANEOUS | Status: DC
  Administered 2015-02-08: 3 [IU] via SUBCUTANEOUS

## 2015-02-07 MED ORDER — HYDRALAZINE HCL 20 MG/ML IJ SOLN: 5.0000 mg | INTRAMUSCULAR | Status: DC | PRN

## 2015-02-07 MED ORDER — SODIUM CHLORIDE 0.9 % IJ SOLN
3.0000 mL | Freq: Two times a day (BID) | INTRAMUSCULAR | Status: DC
  Administered 2015-02-07 – 2015-02-08 (×2): 3 mL via INTRAVENOUS

## 2015-02-07 MED ORDER — SODIUM CHLORIDE 0.9 % IV BOLUS (SEPSIS)
500.0000 mL | Freq: Once | INTRAVENOUS | Status: AC
  Administered 2015-02-07: 500 mL via INTRAVENOUS

## 2015-02-07 MED ORDER — OCUVITE-LUTEIN PO CAPS
1.0000 | ORAL_CAPSULE | Freq: Every day | ORAL | Status: DC
  Administered 2015-02-08: 1 via ORAL
  Filled 2015-02-07: qty 1

## 2015-02-07 MED ORDER — ACETAMINOPHEN 325 MG PO TABS: 650.0000 mg | ORAL_TABLET | Freq: Four times a day (QID) | ORAL | Status: DC | PRN

## 2015-02-07 MED ORDER — ICAPS PO CAPS: 1.0000 | ORAL_CAPSULE | Freq: Every day | ORAL | Status: DC

## 2015-02-07 MED ORDER — ASPIRIN EC 81 MG PO TBEC: 81.0000 mg | DELAYED_RELEASE_TABLET | Freq: Every day | ORAL | Status: DC

## 2015-02-07 MED ORDER — HEPARIN SODIUM (PORCINE) 5000 UNIT/ML IJ SOLN: 5000.0000 [IU] | Freq: Three times a day (TID) | INTRAMUSCULAR | Status: DC

## 2015-02-07 MED ORDER — AMLODIPINE BESYLATE 5 MG PO TABS
5.0000 mg | ORAL_TABLET | Freq: Every day | ORAL | Status: DC
Start: 2015-02-07 — End: 2015-02-08
  Administered 2015-02-07 – 2015-02-08 (×2): 5 mg via ORAL
  Filled 2015-02-07 (×2): qty 1

## 2015-02-07 NOTE — ED Provider Notes (Signed)
CSN: [id number]     Arrival date & time 02/07/15  1524 History   First MD Initiated Contact with Patient 02/07/15 1543     Chief Complaint  Patient presents with  . Loss of Consciousness     (Consider location/radiation/quality/duration/timing/severity/associated sxs/prior Treatment) HPI   Clayton [name] is a 81 y.o. male who presents for evaluation of syncope. He was leaning over, chewing bushes when he felt the urge to have a bowel movement, and suddenly passed out. His daughter was with him. He feels like he was unresponsive for 5 minutes. He came around Gradually, but is back at his normal mentation at this time. He was transferred by EMS. History of similar problem 2 months ago. No other recent illnesses, including fever, vomiting, diarrhea, chest pain, cough or shortness of breath. Patient is a somewhat poor historian. He defers answer to his daughter who is well aware of his situation. There are no other known modifying factors.   Past Medical History  Diagnosis Date  . Macular degeneration   . Hypertension   . Diabetes mellitus without complication (HCC)   . paroxysmal afib   . CKD (chronic kidney disease), stage III    Past Surgical History  Procedure Laterality Date  . Orif humerus fracture Right 06/26/2012    Procedure: OPEN REDUCTION INTERNAL FIXATION (ORIF) PROXIMAL HUMERUS FRACTURE, right ;  Surgeon: Budd Palmer, MD;  Location: [facility] OR;  Service: Orthopedics;  Laterality: Right;   History reviewed. No pertinent family history. Social History  Substance Use Topics  . Smoking status: Never Smoker   . Smokeless tobacco: None  . Alcohol Use: No    Review of Systems  All other systems reviewed and are negative.     Allergies  Sulfa antibiotics  Home Medications   Prior to Admission medications   Medication Sig Start Date End Date Taking? Authorizing Provider  ibuprofen (ADVIL,MOTRIN) 800 MG tablet Take 800 mg by mouth as needed (pain).    Yes Historical  Provider, MD  lisinopril (PRINIVIL,ZESTRIL) 20 MG tablet Take 20 mg by mouth daily at 12 noon.    Yes Historical Provider, MD  metFORMIN (GLUCOPHAGE-XR) 500 MG 24 hr tablet Take 500 mg by mouth daily with breakfast.   Yes Historical Provider, MD  Multiple Vitamins-Minerals (ICAPS PO) Take 1 capsule by mouth daily.   Yes Historical Provider, MD   BP 165/85 mmHg  Pulse 77  Temp(Src) 97.7 F (36.5 C) (Oral)  Resp 19  SpO2 99% Physical Exam  Constitutional: He is oriented to person, place, and time. He appears well-developed.  Elderly, frail  HENT:  Head: Normocephalic and atraumatic.  Right Ear: External ear normal.  Left Ear: External ear normal.  Eyes: Conjunctivae and EOM are normal. Pupils are equal, round, and reactive to light.  Neck: Normal range of motion and phonation normal. Neck supple.  Cardiovascular: Normal rate, regular rhythm and normal heart sounds.   Pulmonary/Chest: Effort normal and breath sounds normal. He exhibits no bony tenderness.  Abdominal: Soft. There is no tenderness.  Genitourinary:  Normal anus. Soft brown stool in rectum.  Musculoskeletal: Normal range of motion.  No deformity or tenderness of any of the large joints.  Neurological: He is alert and oriented to person, place, and time. No cranial nerve deficit or sensory deficit. He exhibits normal muscle tone. Coordination normal.  He is dysarthric. Grossly nonfocal neurologic exam  Skin: Skin is warm, dry and intact.  Psychiatric: He has a normal mood and affect. His  behavior is normal.  Nursing note and vitals reviewed.   ED Course  Procedures (including critical care time)  Medications  sodium chloride 0.9 % bolus 500 mL (0 mLs Intravenous Stopped 02/07/15 1748)    Patient Vitals for the past 24 hrs:  BP Temp Temp src Pulse Resp SpO2  02/07/15 1945 165/85 mmHg - - 77 19 99 %  02/07/15 1930 146/67 mmHg - - 76 13 98 %  02/07/15 1915 182/83 mmHg - - 81 12 97 %  02/07/15 1900 138/60 mmHg - - 74  16 96 %  02/07/15 1845 (!) 151/54 mmHg - - 74 18 97 %  02/07/15 1830 131/56 mmHg - - 78 16 95 %  02/07/15 1815 (!) 134/53 mmHg - - 78 18 96 %  02/07/15 1800 (!) 141/52 mmHg - - 80 19 100 %  02/07/15 1745 128/59 mmHg - - 78 21 100 %  02/07/15 1730 125/55 mmHg - - 76 17 99 %  02/07/15 1715 128/55 mmHg - - 76 20 100 %  02/07/15 1552 (!) 127/48 mmHg 97.7 F (36.5 C) Oral 70 21 97 %  02/07/15 1524 - - - - - 96 %    7:43 PM Reevaluation with update and discussion. After initial assessment and treatment, an updated evaluation reveals he remains alert, calm, cooperative. Rectal examination completed. Xzavian Semmel L   7:49 PM-Consult complete with Hospitalist. Patient case explained and discussed. He agrees to admit patient for further evaluation and treatment. Call ended at 2015   Labs Review Labs Reviewed  BASIC METABOLIC PANEL - Abnormal; Notable for the following:    Potassium 5.2 (*)    CO2 21 (*)    Glucose, Bld 280 (*)    BUN 35 (*)    Creatinine, Ser 1.52 (*)    Calcium 8.4 (*)    GFR calc non Af Amer 38 (*)    GFR calc Af Amer 44 (*)    All other components within normal limits  CBC WITH DIFFERENTIAL/PLATELET - Abnormal; Notable for the following:    RBC 2.69 (*)    Hemoglobin 8.0 (*)    HCT 25.2 (*)    Monocytes Absolute 1.1 (*)    All other components within normal limits  URINALYSIS, ROUTINE W REFLEX MICROSCOPIC (NOT AT [facility]) - Abnormal; Notable for the following:    Leukocytes, UA TRACE (*)    All other components within normal limits  URINE CULTURE  URINE MICROSCOPIC-ADD ON  POC OCCULT BLOOD, ED    HEMOGLOBIN  Date Value Ref Range Status  02/07/2015 8.0* 13.0 - 17.0 g/dL Final  81/19/1478 29.5* 13.0 - 17.0 g/dL Final  62/13/0865 9.6* 13.0 - 17.0 g/dL Final  78/46/9629 52.8* 13.0 - 17.0 g/dL Final   BUN  Date Value Ref Range Status  02/07/2015 35* 6 - 20 mg/dL Final  41/32/4401 24* 6 - 23 mg/dL Final  02/72/5366 35* 6 - 23 mg/dL Final  44/07/4740 30* 6 -  23 mg/dL Final   CREATININE, SER  Date Value Ref Range Status  02/07/2015 1.52* 0.61 - 1.24 mg/dL Final  59/56/3875 6.43 0.50 - 1.35 mg/dL Final  32/95/1884 1.66 0.50 - 1.35 mg/dL Final  11/03/1599 0.93 0.50 - 1.35 mg/dL Final      Imaging Review Dg Chest 2 View  02/07/2015   CLINICAL DATA:  Syncope  EXAM: CHEST  2 VIEW  COMPARISON:  06/25/2012  FINDINGS: Cardiac enlargement without heart failure. Atherosclerotic aortic arch unchanged. Moderate to large hiatal hernia with air-fluid  level.  Negative for pneumonia or effusion.  Lungs are clear  Lower thoracic compression fractures at 2 levels with cement vertebral augmentation. Chronic fracture right humeral neck.  IMPRESSION: No active cardiopulmonary disease.  Moderate to large hiatal hernia.   Electronically Signed   By: Marlan Palau M.D.   On: 02/07/2015 17:00   Dg Abd 1 View  02/07/2015   CLINICAL DATA:  Left lower quadrant pain.  EXAM: ABDOMEN - 1 VIEW  COMPARISON:  03/19/2010  FINDINGS: Bilateral abdominal calcifications are suggestive for kidney stones, right side greater the left. The largest right stone measures up to 1 cm. Evidence for vertebral body augmentation procedures at L1 and T12. Nonobstructive bowel gas pattern. Small amount of gas in the transverse colon region. Atherosclerotic calcifications. Right hip joint space narrowing compatible with osteoarthritis.  IMPRESSION: Nonobstructive bowel gas pattern.  Abdominal calcifications are most compatible with kidney stones.   Electronically Signed   By: Richarda Overlie M.D.   On: 02/07/2015 17:02   I have personally reviewed and evaluated these images and lab results as part of my medical decision-making.   EKG Interpretation   Date/Time:  Tuesday February 07 2015 15:38:42 EDT Ventricular Rate:  74 PR Interval:  188 QRS Duration: 126 QT Interval:  434 QTC Calculation: 481 R Axis:   88 Text Interpretation:  Sinus rhythm with occasional Premature ventricular  complexes Right  bundle branch block Abnormal ECG No old tracing to compare  Confirmed by [facility]  MD, Kore Madlock [id number]) on 02/07/2015 3:55:07 PM      MDM   Final diagnoses:  Syncope, unspecified syncope type  Anemia, unspecified anemia type  Renal insufficiency      Syncope, cause not clear. Incidental finding of marked anemia. No evidence for acute volume loss. Patient need to be admitted for observation, and evaluation of anemia. He does have CKD, with Creat. Bumped to 1.5 today.   Nursing Notes Reviewed/ Care Coordinated Applicable Imaging Reviewed Interpretation of Laboratory Data incorporated into ED treatment   Mancel Bale, MD 02/07/15 2016

## 2015-02-07 NOTE — ED Notes (Signed)
Pt transported to xray 

## 2015-02-07 NOTE — H&P (Signed)
[facility] History and Physical  Clayton [name] Clayton [name]:[id number] DOB: 1919/05/25 DOA: 02/07/2015  Referring physician: ED physician PCP: Michiel Sites, MD  Specialists:   Chief Complaint: Syncope  HPI: Clayton [name] is a 81 y.o. male with PMH of hypertension, diabetes mellitus, macular degeneration, CKD-III, who presents with syncope.  Per patient's daughter, patient passed out when he was sitting in walker chair, trimming bushes at about 2:00 PM. He become unresponsive, and had urinary incontinence. He was "staring off into the space". He was noticed to have drooling. Before the event, patient told her daughter that he wanted to go to bathroom, but did not go yet. The event lasted for approximately 5 minutes per her daughter. Patient reports that after event, he had transient pain over left lower quadrant, which resolved quickly. He does not have dysuria or burning on urination, but has chronically increased urinary frequency. Currently patient does not have abdominal pain, nausea, vomiting, diarrhea, chest pain, shortness of breath, cough, unilateral weakness. He has mild left facial droop to my examination. Patient does not have hematochezia, hematuria and hematemesis.  In ED, patient was found to have him a globin dropped from 12.1 on 05/31/13-->8.0, negative FOBT, urinalysis today with small amount for leukocytes, temperature normal, no tachycardia, potassium 5.2 without T-wave changes, worsening renal function. Chest x-ray is negative for infiltration, but showed moderate hiatal hernia. X-ray of abdomen showed possible bilateral kidney stone. Patient is admitted to inpatient for further evaluation and treatment.  Where does patient live?   At home    Can patient participate in ADLs? Little  Review of Systems:   General: no fevers, chills, no changes in body weight, has fatigue HEENT: no blurry vision, hearing changes or sore throat Pulm: no dyspnea, coughing, wheezing CV:  no chest pain, palpitations Abd: no nausea, vomiting, had transient abdominal pain, no diarrhea, constipation GU: no dysuria, burning on urination, increased urinary frequency, hematuria  Ext: no leg edema Neuro: no unilateral weakness, numbness, or tingling, no vision change or hearing loss. Has mild L facial droop. Skin: no rash MSK: No muscle spasm, no deformity, no limitation of range of movement in spin Heme: No easy bruising.  Travel history: No recent long distant travel.  Allergy:  Allergies  Allergen Reactions  . Sulfa Antibiotics Itching    Past Medical History  Diagnosis Date  . Macular degeneration   . Hypertension   . Diabetes mellitus without complication (HCC)   . paroxysmal afib   . CKD (chronic kidney disease), stage III     Past Surgical History  Procedure Laterality Date  . Orif humerus fracture Right 06/26/2012    Procedure: OPEN REDUCTION INTERNAL FIXATION (ORIF) PROXIMAL HUMERUS FRACTURE, right ;  Surgeon: Budd Palmer, MD;  Location: [facility] OR;  Service: Orthopedics;  Laterality: Right;    Social History:  reports that he has never smoked. He does not have any smokeless tobacco history on file. He reports that he does not drink alcohol or use illicit drugs.  Family History:  Family History  Problem Relation Age of Onset  . Aneurysm Father     Aortic aneurysm     Prior to Admission medications   Medication Sig Start Date End Date Taking? Authorizing Provider  ibuprofen (ADVIL,MOTRIN) 800 MG tablet Take 800 mg by mouth as needed (pain).    Yes Historical Provider, MD  lisinopril (PRINIVIL,ZESTRIL) 20 MG tablet Take 20 mg by mouth daily at 12 noon.    Yes Historical Provider, MD  metFORMIN (GLUCOPHAGE-XR) 500 MG 24 hr tablet Take 500 mg by mouth daily with breakfast.   Yes Historical Provider, MD  Multiple Vitamins-Minerals (ICAPS PO) Take 1 capsule by mouth daily.   Yes Historical Provider, MD    Physical Exam: Filed Vitals:   02/07/15 1900  02/07/15 1915 02/07/15 1930 02/07/15 1945  BP: 138/60 182/83 146/67 165/85  Pulse: 74 81 76 77  Temp:      TempSrc:      Resp: 16 12 13 19   SpO2: 96% 97% 98% 99%   General: Not in acute distress HEENT:       Eyes: PERRL, EOMI, no scleral icterus.       ENT: No discharge from the ears and nose, no pharynx injection, no tonsillar enlargement.        Neck: No JVD, no bruit, no mass felt. Heme: No neck lymph node enlargement. Cardiac: S1/S2, RRR, No murmurs, No gallops or rubs. Pulm:  No rales, wheezing, rhonchi or rubs. Abd: Soft, nondistended, nontender, no rebound pain, no organomegaly, BS present. Ext: No pitting leg edema bilaterally. 2+DP/PT pulse bilaterally. Musculoskeletal: No joint deformities, No joint redness or warmth, no limitation of ROM in spin. Skin: No rashes.  Neuro: Alert, oriented X3, cranial nerves II-XII grossly intact except for mild L facial droop, muscle strength 5/5 in all extremities, sensation to light touch intact. Brachial reflex 2+ bilaterally. Knee reflex 1+ bilaterally. Negative Babinski's sign. Normal finger to nose test. Psych: Patient is not psychotic, no suicidal or hemocidal ideation.  Labs on Admission:  Basic Metabolic Panel:  Recent Labs Lab 02/07/15 1631  NA 136  K 5.2*  CL 107  CO2 21*  GLUCOSE 280*  BUN 35*  CREATININE 1.52*  CALCIUM 8.4*   Liver Function Tests: No results for input(s): AST, ALT, ALKPHOS, BILITOT, PROT, ALBUMIN in the last 168 hours. No results for input(s): LIPASE, AMYLASE in the last 168 hours. No results for input(s): AMMONIA in the last 168 hours. CBC:  Recent Labs Lab 02/07/15 1631  WBC 9.0  NEUTROABS 6.7  HGB 8.0*  HCT 25.2*  MCV 93.7  PLT 219   Cardiac Enzymes: No results for input(s): CKTOTAL, CKMB, CKMBINDEX, TROPONINI in the last 168 hours.  BNP (last 3 results) No results for input(s): BNP in the last 8760 hours.  ProBNP (last 3 results) No results for input(s): PROBNP in the last 8760  hours.  CBG: No results for input(s): GLUCAP in the last 168 hours.  Radiological Exams on Admission: Dg Chest 2 View  02/07/2015   CLINICAL DATA:  Syncope  EXAM: CHEST  2 VIEW  COMPARISON:  06/25/2012  FINDINGS: Cardiac enlargement without heart failure. Atherosclerotic aortic arch unchanged. Moderate to large hiatal hernia with air-fluid level.  Negative for pneumonia or effusion.  Lungs are clear  Lower thoracic compression fractures at 2 levels with cement vertebral augmentation. Chronic fracture right humeral neck.  IMPRESSION: No active cardiopulmonary disease.  Moderate to large hiatal hernia.   Electronically Signed   By: Marlan Palau M.D.   On: 02/07/2015 17:00   Dg Abd 1 View  02/07/2015   CLINICAL DATA:  Left lower quadrant pain.  EXAM: ABDOMEN - 1 VIEW  COMPARISON:  03/19/2010  FINDINGS: Bilateral abdominal calcifications are suggestive for kidney stones, right side greater the left. The largest right stone measures up to 1 cm. Evidence for vertebral body augmentation procedures at L1 and T12. Nonobstructive bowel gas pattern. Small amount of gas in the transverse colon region.  Atherosclerotic calcifications. Right hip joint space narrowing compatible with osteoarthritis.  IMPRESSION: Nonobstructive bowel gas pattern.  Abdominal calcifications are most compatible with kidney stones.   Electronically Signed   By: Richarda Overlie M.D.   On: 02/07/2015 17:02    EKG: Independently reviewed.  Abnormal findings:   QTC 481, right bundle blockage, PVC.  Patient had right bundle blockage on previous EKG of 12/28/14.  Assessment/Plan Principal Problem:   Syncope Active Problems:   Essential hypertension, benign   Diabetes mellitus without complication (HCC)   paroxysmal afib   Acute renal failure superimposed on stage 3 chronic kidney disease (HCC)   Normocytic anemia   Hyperkalemia   Syncope: Etiology is not clear. Differential diagnosis include TIA/stroke given mild left facial droop,  cardiac arrhythmia given abnormal EKG and hyperkalemia, seizure, orthostatic status, vasovagal syncope.  -will admit to tele bed - Orthostatic vital signs  - troponins x1  - MRI-brain - 2d echo - Neuro checks  - eeg - PT/OT eval and treat - IV fluid: 500 mL normal saline bolus in the emergency room, followed by 75 mL per hour  HTN: -Hold lisinopril due to worsening renal function and hyperkalemia -Start amlodipine 5 mg daily -IV hydralazine.  DM-II: Last A1c 6.9 on 06/25/12, well controled. Patient is taking metformin at home -SSI -Check A1c  AoCKD-III: Baseline Cre is 1.35 on 05/31/13, his Cre is 1.52, BUN 35 on admission. Likely due to prerenal secondary to dehydration and continuation of ACEI and NSAIDs.  - IVF as above - Check FeNa  - US-renal - Follow up renal function by BMP - Hold lisinopril and ibuprofen -check CK  Normocytic anemia: Hemoglobin dropped from 12.1--> 8.0. F OBT negative. Etiology is not clear. Patient never had colonoscopy. -CBC every 8 hour -check LDH, LFT and haptoglobin to rule out hemolysis -INR/PTT/type & screen -Anemia panel  Hyperkalemia: Potassium 5.2, no EKG change. Likely due to worsening renal function and use of lisinopril -Hold lisinopril -Kayexalate 15 g 1 -Follow-up by BMP  DVT ppx: SQ Heparin     Code Status: DNR Family Communication:  Yes, patient's daughter at bed side Disposition Plan: Admit to inpatient   Date of Service 02/07/2015    Lorretta Harp [facility] Pager [phone]  If 7PM-7AM, please contact night-coverage [email or web address] Password [email or web address] 02/07/2015, 9:20 PM

## 2015-02-07 NOTE — ED Notes (Signed)
Pt here from [place] with c/o syncope. Per EMS pt was outside with daughter and said he need to use restroom. Pt was in driveway when daughter reports unresponsive. Pt was appropriate upon EMS arrival but EMS reports periods of "starring off into space." Vitals stable. No pain reoprted.

## 2015-02-08 ENCOUNTER — Ambulatory Visit (HOSPITAL_COMMUNITY): Payer: Medicare Other

## 2015-02-08 ENCOUNTER — Inpatient Hospital Stay (HOSPITAL_COMMUNITY): Payer: Medicare Other

## 2015-02-08 DIAGNOSIS — R55 Syncope and collapse: Principal | ICD-10-CM

## 2015-02-08 DIAGNOSIS — N179 Acute kidney failure, unspecified: Secondary | ICD-10-CM

## 2015-02-08 DIAGNOSIS — D649 Anemia, unspecified: Secondary | ICD-10-CM

## 2015-02-08 DIAGNOSIS — E875 Hyperkalemia: Secondary | ICD-10-CM

## 2015-02-08 DIAGNOSIS — I10 Essential (primary) hypertension: Secondary | ICD-10-CM

## 2015-02-08 DIAGNOSIS — E119 Type 2 diabetes mellitus without complications: Secondary | ICD-10-CM

## 2015-02-08 DIAGNOSIS — N183 Chronic kidney disease, stage 3 (moderate): Secondary | ICD-10-CM

## 2015-02-08 LAB — GLUCOSE, CAPILLARY
GLUCOSE-CAPILLARY: 225 mg/dL — AB (ref 65–99)
Glucose-Capillary: 115 mg/dL — ABNORMAL HIGH (ref 65–99)
Glucose-Capillary: 123 mg/dL — ABNORMAL HIGH (ref 65–99)
Glucose-Capillary: 138 mg/dL — ABNORMAL HIGH (ref 65–99)

## 2015-02-08 LAB — IRON AND TIBC
Iron: 24 ug/dL — ABNORMAL LOW (ref 45–182)
SATURATION RATIOS: 6 % — AB (ref 17.9–39.5)
TIBC: 435 ug/dL (ref 250–450)
UIBC: 411 ug/dL

## 2015-02-08 LAB — HEPATIC FUNCTION PANEL
ALK PHOS: 73 U/L (ref 38–126)
ALT: 13 U/L — AB (ref 17–63)
AST: 19 U/L (ref 15–41)
Albumin: 3.7 g/dL (ref 3.5–5.0)
Total Bilirubin: 0.4 mg/dL (ref 0.3–1.2)
Total Protein: 6.3 g/dL — ABNORMAL LOW (ref 6.5–8.1)

## 2015-02-08 LAB — COMPREHENSIVE METABOLIC PANEL
ALBUMIN: 3.3 g/dL — AB (ref 3.5–5.0)
ALK PHOS: 64 U/L (ref 38–126)
ALT: 11 U/L — AB (ref 17–63)
ANION GAP: 7 (ref 5–15)
AST: 18 U/L (ref 15–41)
BILIRUBIN TOTAL: 0.3 mg/dL (ref 0.3–1.2)
BUN: 27 mg/dL — AB (ref 6–20)
CALCIUM: 8.9 mg/dL (ref 8.9–10.3)
CO2: 23 mmol/L (ref 22–32)
CREATININE: 1.41 mg/dL — AB (ref 0.61–1.24)
Chloride: 109 mmol/L (ref 101–111)
GFR calc Af Amer: 48 mL/min — ABNORMAL LOW (ref >60)
GFR calc non Af Amer: 41 mL/min — ABNORMAL LOW (ref >60)
GLUCOSE: 114 mg/dL — AB (ref 65–99)
Potassium: 4.6 mmol/L (ref 3.5–5.1)
Sodium: 139 mmol/L (ref 135–145)
TOTAL PROTEIN: 5.8 g/dL — AB (ref 6.5–8.1)

## 2015-02-08 LAB — CBC
HCT: 27.2 % — ABNORMAL LOW (ref 39.0–52.0)
HEMATOCRIT: 27.3 % — AB (ref 39.0–52.0)
HEMOGLOBIN: 8.8 g/dL — AB (ref 13.0–17.0)
Hemoglobin: 8.6 g/dL — ABNORMAL LOW (ref 13.0–17.0)
MCH: 29.8 pg (ref 26.0–34.0)
MCH: 29.4 pg (ref 26.0–34.0)
MCHC: 32.4 g/dL (ref 30.0–36.0)
MCHC: 31.5 g/dL (ref 30.0–36.0)
MCV: 92.2 fL (ref 78.0–100.0)
MCV: 93.2 fL (ref 78.0–100.0)
PLATELETS: 203 10*3/uL (ref 150–400)
Platelets: 212 10*3/uL (ref 150–400)
RBC: 2.95 MIL/uL — AB (ref 4.22–5.81)
RBC: 2.93 MIL/uL — ABNORMAL LOW (ref 4.22–5.81)
RDW: 13.1 % (ref 11.5–15.5)
RDW: 13.2 % (ref 11.5–15.5)
WBC: 9.3 10*3/uL (ref 4.0–10.5)
WBC: 7.7 10*3/uL (ref 4.0–10.5)

## 2015-02-08 LAB — URINE CULTURE
Culture: NO GROWTH
SPECIAL REQUESTS: NORMAL

## 2015-02-08 LAB — FERRITIN: FERRITIN: 10 ng/mL — AB (ref 24–336)

## 2015-02-08 LAB — TYPE AND SCREEN
ABO/RH(D): A POS
Antibody Screen: NEGATIVE

## 2015-02-08 LAB — TROPONIN I

## 2015-02-08 LAB — FOLATE: Folate: 26.1 ng/mL (ref >5.9)

## 2015-02-08 LAB — VITAMIN B12: Vitamin B-12: 418 pg/mL (ref 180–914)

## 2015-02-08 LAB — CK: Total CK: 83 U/L (ref 49–397)

## 2015-02-08 LAB — LACTATE DEHYDROGENASE: LDH: 146 U/L (ref 98–192)

## 2015-02-08 MED ORDER — FERROUS GLUCONATE 324 (38 FE) MG PO TABS
324.0000 mg | ORAL_TABLET | Freq: Every day | ORAL | Status: DC
  Filled 2015-02-08: qty 1

## 2015-02-08 MED ORDER — FERROUS GLUCONATE 324 (38 FE) MG PO TABS: 324.0000 mg | ORAL_TABLET | Freq: Every day | ORAL | Status: DC

## 2015-02-08 NOTE — Progress Notes (Signed)
OT Cancellation Note  Patient Details Name: Clayton [name] MRN: [id number] DOB: 26-Dec-1919   Cancelled Treatment:    Reason Eval/Treat Not Completed: OT screened, no needs identified, will sign off. Per RN pt's family does not feel he needs PT/OT at this time because he is independent at his apartment. Please re-order OT if pt's stay is longer than anticipated or needed for insurance purposes.   Margaretmary Eddy [facility] 02/08/2015, 1:33 PM

## 2015-02-08 NOTE — Procedures (Signed)
ELECTROENCEPHALOGRAM REPORT   Patient: Clayton [name]       Room #: 1O10 EEG No. ID: [id number] Age: 81 y.o.        Sex: male Referring Physician: Elvera Lennox Report Date:  02/08/2015        Interpreting Physician: Thana Farr  History: [name] is an 81 y.o. male with syncope evaluated to rule out seizure  Medications:  Scheduled: . amLODipine  5 mg Oral Daily  . insulin aspart  0-9 Units Subcutaneous TID WC  . multivitamin-lutein  1 capsule Oral Daily  . sodium chloride  3 mL Intravenous Q12H    Conditions of Recording:  This is a 16 channel EEG carried out with the patient in the awake, drowsy and asleep state.  Description:  The waking background activity consists of a low voltage, symmetrical, fairly well organized, 9 Hz alpha activity, seen from the parieto-occipital and posterior temporal regions.  Low voltage fast activity, poorly organized, is seen anteriorly and is at times superimposed on more posterior regions.  A mixture of theta and alpha rhythms are seen from the central and temporal regions. The patient drowses with slowing to irregular, low voltage theta and beta activity.   The patient goes in to a light sleep with symmetrical sleep spindles, vertex central sharp transients and irregular slow activity.  No epileptiform activity is noted.   Hyperventilation and intermittent photic stimulation were not performed.   IMPRESSION: Normal electroencephalogram, awake and asleep. There are no focal lateralizing or epileptiform features.   Thana Farr, MD [facility] [phone] 02/08/2015, 10:36 AM

## 2015-02-08 NOTE — Progress Notes (Signed)
PT Cancellation Note  Patient Details Name: Clayton [name] MRN: [id number] DOB: 1919-12-19   Cancelled Treatment:    Reason Eval/Treat Not Completed: PT screened, no needs identified, will sign off.  Per RN pt's family does not feel he needs PT/OT at this time because he is independent at his apartment.  I spoke with pt (family left) and he agrees.  Please re-order PT if pt's stay is longer than anticipated or needed for insurance purposes.  Thanks,    Rollene Rotunda. Mickey Esguerra, PT, DPT [phone]   02/08/2015, 1:25 PM

## 2015-02-08 NOTE — Care Management Note (Signed)
Case Management Note  Patient Details  Name: Clayton [name] MRN: [id number] Date of Birth: [birth year]  Subjective/Objective:   Pt admitted for syncopal episode. Plan is to return to [facility] once stable.                  Action/Plan: No needs identified by CM at this time.    Expected Discharge Date:                  Expected Discharge Plan:  Home/Self Care (Pt from [facility]. )  In-House Referral:     Discharge planning Services  CM Consult  Post Acute Care Choice:  NA Choice offered to:  NA  DME Arranged:  N/A DME Agency:  NA  HH Arranged:  NA HH Agency:  NA  Status of Service:  Completed, signed off  Medicare Important Message Given:    Date Medicare IM Given:    Medicare IM give by:    Date Additional Medicare IM Given:    Additional Medicare Important Message give by:     If discussed at Long Length of Stay Meetings, dates discussed:    Additional Comments:  Gala Lewandowsky, RN 02/08/2015, 2:16 PM

## 2015-02-08 NOTE — Discharge Summary (Signed)
Physician Discharge Summary  Clayton [name] ZOX:[id number] DOB: Feb 28, 1920 DOA: 02/07/2015  PCP: Michiel Sites, MD  Admit date: 02/07/2015 Discharge date: 02/08/2015  Time spent: > 30 minutes  Recommendations for Outpatient Follow-up:  1. Follow up with Dr. Juleen China in 1-2 weeks   Discharge Diagnoses:  Principal Problem:   Syncope Active Problems:   Essential hypertension, benign   Diabetes mellitus without complication (HCC)   Acute renal failure superimposed on stage 3 chronic kidney disease (HCC)   Normocytic anemia   Hyperkalemia  Discharge Condition: stable  Diet recommendation: heart healthy  Filed Weights   02/07/15 2309 02/08/15 0452  Weight: 62.506 kg (137 lb 12.8 oz) 62.506 kg (137 lb 12.8 oz)    History of present illness:  Clayton [name] is a 81 y.o. male with PMH of hypertension, diabetes mellitus, macular degeneration, CKD-III, who presents with syncope. Per patient's daughter, patient passed out when he was sitting in walker chair, trimming bushes at about 2:00 PM. He become unresponsive, and had urinary incontinence. He was "staring off into the space". He was noticed to have drooling. Before the event, patient told her daughter that he wanted to go to bathroom, but did not go yet. The event lasted for approximately 5 minutes per her daughter. Patient reports that after event, he had transient pain over left lower quadrant, which resolved quickly. He does not have dysuria or burning on urination, but has chronically increased urinary frequency. Currently patient does not have abdominal pain, nausea, vomiting, diarrhea, chest pain, shortness of breath, cough, unilateral weakness. He has mild left facial droop to my examination. Patient does not have hematochezia, hematuria and hematemesis. In ED, patient was found to have him a globin dropped from 12.1 on 05/31/13-->8.0, negative FOBT, urinalysis today with small amount for leukocytes, temperature normal, no  tachycardia, potassium 5.2 without T-wave changes, worsening renal function. Chest x-ray is negative for infiltration, but showed moderate hiatal hernia. X-ray of abdomen showed possible bilateral kidney stone. Patient is admitted to inpatient for further evaluation and treatment.  Hospital Course:  Syncope - likely vasovagal, patient was admitted and monitored on telemetry without significant arrhythmias. He underwent an MRI of the brain which was negative for CVA, also a 2D echo which showed normal EF, and also an EEG which did not show any concerning features. He has remained stable with negative workup, cardiac enzymes were normal, asking to go home, he was discharged home in stable condition. Of note, patient and family declined to work with PT and could not evaluate his functional status.  HTN - stable DM-II - Last A1c 6.9 on 06/25/12, well controled, A1C in process on dsicharge AoCKD-III - Baseline Cre is ~ 1.35 on 05/31/13, his Cre is 1.52, BUN 35 on admission. Likely due to prerenal secondary to dehydration and continuation of ACEI and NSAIDs. Improved with hydration and back to baseline. Renal US without hydronephrosis Normocytic anemia - Hb between 9 and 12 in the past 1-2 years, no bleeding noted, he is iron deficient and was started on supplements. Fecal occult negative Hyperkalemia - Potassium borderline at 5.2 on admission, no EKG change. Improved after kayexalate  Procedures:  None    Consultations:  None  Discharge Exam: Filed Vitals:   02/07/15 2309 02/08/15 0452 02/08/15 0500 02/08/15 1028  BP: 184/87  114/54 169/75  Pulse: 77  73   Temp: 98.4 F (36.9 C)  97.5 F (36.4 C)   TempSrc: Oral  Oral   Resp: 20  16  Height: 5\' 11"  (1.803 m)     Weight: 62.506 kg (137 lb 12.8 oz) 62.506 kg (137 lb 12.8 oz)    SpO2: 100%  95%    General: NAD Cardiovascular: RRR Respiratory: CTA biL  Discharge Instructions     Medication List    STOP taking these medications          ibuprofen 800 MG tablet  Commonly known as:  ADVIL,MOTRIN      TAKE these medications        ferrous gluconate 324 MG tablet  Commonly known as:  FERGON  Take 1 tablet (324 mg total) by mouth daily with breakfast.  Start taking on:  02/09/2015     ICAPS PO  Take 1 capsule by mouth daily.     lisinopril 20 MG tablet  Commonly known as:  PRINIVIL,ZESTRIL  Take 20 mg by mouth daily at 12 noon.     metFORMIN 500 MG 24 hr tablet  Commonly known as:  GLUCOPHAGE-XR  Take 500 mg by mouth daily with breakfast.           Follow-up Information    Follow up with Michiel Sites, MD. Schedule an appointment as soon as possible for a visit in 1 week.   Specialty:  Endocrinology   Contact information:   [address] [place] [address] [phone]       The results of significant diagnostics from this hospitalization (including imaging, microbiology, ancillary and laboratory) are listed below for reference.    Significant Diagnostic Studies: Dg Chest 2 View  02/07/2015   CLINICAL DATA:  Syncope  EXAM: CHEST  2 VIEW  COMPARISON:  06/25/2012  FINDINGS: Cardiac enlargement without heart failure. Atherosclerotic aortic arch unchanged. Moderate to large hiatal hernia with air-fluid level.  Negative for pneumonia or effusion.  Lungs are clear  Lower thoracic compression fractures at 2 levels with cement vertebral augmentation. Chronic fracture right humeral neck.  IMPRESSION: No active cardiopulmonary disease.  Moderate to large hiatal hernia.   Electronically Signed   By: Marlan Palau M.D.   On: 02/07/2015 17:00   Dg Abd 1 View  02/07/2015   CLINICAL DATA:  Left lower quadrant pain.  EXAM: ABDOMEN - 1 VIEW  COMPARISON:  03/19/2010  FINDINGS: Bilateral abdominal calcifications are suggestive for kidney stones, right side greater the left. The largest right stone measures up to 1 cm. Evidence for vertebral body augmentation procedures at L1 and T12. Nonobstructive  bowel gas pattern. Small amount of gas in the transverse colon region. Atherosclerotic calcifications. Right hip joint space narrowing compatible with osteoarthritis.  IMPRESSION: Nonobstructive bowel gas pattern.  Abdominal calcifications are most compatible with kidney stones.   Electronically Signed   By: Richarda Overlie M.D.   On: 02/07/2015 17:02   Mr Brain Wo Contrast  02/07/2015   CLINICAL DATA:  Initial valuation for single episode of syncope earlier today.  EXAM: MRI HEAD WITHOUT CONTRAST  TECHNIQUE: Multiplanar, multiecho pulse sequences of the brain and surrounding structures were obtained without intravenous contrast.  COMPARISON:  None available.  FINDINGS: Diffuse prominence of the CSF containing spaces is consistent with generalized cerebral atrophy. Patchy and confluent T2/FLAIR hyperintensity within the periventricular and deep white matter both cerebral hemispheres most likely related chronic small vessel ischemic disease.  No abnormal foci of restricted diffusion to suggest acute intracranial infarct. Normal intravascular flow voids maintained. No acute or chronic intracranial hemorrhage.  No mass lesion, midline shift, or mass effect. No hydrocephalus. No  extra-axial fluid collection.  Craniocervical junction within normal limits. Pituitary gland normal.  No acute abnormality about the orbits. Sequela prior bilateral lens extraction noted.  Scattered mucosal thickening within the ethmoidal air cells. Paranasal sinuses are otherwise largely clear. Scattered opacity within the left mastoid air cells. Inner ear structures within normal limits.  Bone marrow signal intensity normal. Scalp soft tissues unremarkable.  IMPRESSION: 1. No acute intracranial process. 2. Generalized age-related cerebral atrophy mild to moderate chronic small vessel ischemic disease.   Electronically Signed   By: Rise Mu M.D.   On: 02/07/2015 22:59   US Renal  02/07/2015   CLINICAL DATA:  Acute kidney injury   EXAM: RENAL / URINARY TRACT ULTRASOUND COMPLETE  COMPARISON:  None.  FINDINGS: Right Kidney:  Length: 9.1 cm. Generalized cortical thinning. Benign 2.8 cm simple cyst in the lower pole medially. No hydronephrosis.  Left Kidney:  Length: 8.9 cm.  Generalized cortical thinning.  No hydronephrosis.  Bladder:  Moderate urinary bladder distention.  IMPRESSION: Atrophic kidneys.  Negative for hydronephrosis.   Electronically Signed   By: Ellery Plunk M.D.   On: 02/07/2015 22:40    Microbiology: Recent Results (from the past 240 hour(s))  Urine culture     Status: None   Collection Time: 02/07/15  6:28 PM  Result Value Ref Range Status   Specimen Description URINE, CLEAN CATCH  Final   Special Requests Normal  Final   Culture NO GROWTH 1 DAY  Final   Report Status 02/08/2015 FINAL  Final     Labs: Basic Metabolic Panel:  Recent Labs Lab 02/07/15 1631 02/08/15 0459  NA 136 139  K 5.2* 4.6  CL 107 109  CO2 21* 23  GLUCOSE 280* 114*  BUN 35* 27*  CREATININE 1.52* 1.41*  CALCIUM 8.4* 8.9   Liver Function Tests:  Recent Labs Lab 02/07/15 2319 02/08/15 0459  AST 19 18  ALT 13* 11*  ALKPHOS 73 64  BILITOT 0.4 0.3  PROT 6.3* 5.8*  ALBUMIN 3.7 3.3*   CBC:  Recent Labs Lab 02/07/15 1631 02/07/15 2319 02/08/15 0459 02/08/15 1318  WBC 9.0 10.6* 9.3 7.7  NEUTROABS 6.7  --   --   --   HGB 8.0* 9.3* 8.8* 8.6*  HCT 25.2* 29.0* 27.2* 27.3*  MCV 93.7 92.9 92.2 93.2  PLT 219 215 203 212   Cardiac Enzymes:  Recent Labs Lab 02/07/15 2319  CKTOTAL 83  TROPONINI <0.03   CBG:  Recent Labs Lab 02/08/15 0001 02/08/15 0443 02/08/15 0801 02/08/15 1137  GLUCAP 138* 115* 123* 225*     Signed:  Awab Abebe  [facility] 02/08/2015, 4:17 PM

## 2015-02-08 NOTE — Progress Notes (Signed)
Patient discharge paperwork gone over in detail with patient and family. All questions answered to patient satisfaction. IV removed, tele discontinued. Patient discharged back to home [facility]) with family, by way of wheelchair.

## 2015-02-08 NOTE — Progress Notes (Signed)
UR Completed Kimesha Claxton Graves-Bigelow, RN,BSN [phone]  

## 2015-02-08 NOTE — Progress Notes (Deleted)
Pt refusing telemetry at this time 

## 2015-02-08 NOTE — Progress Notes (Signed)
Patient's daughter requesting to cancel the PT/OT evaluation. Patient comes from [place] and is independent there. Pt walks with a walker at baseline.

## 2015-02-08 NOTE — Progress Notes (Signed)
EEG Completed; Results Pending  

## 2015-02-08 NOTE — Discharge Instructions (Signed)
Follow with KOHUT,WALTER DENNIS, MD in 5-7 days ° °Please get a complete blood count and chemistry panel checked by your Primary MD at your next visit, and again as instructed by your Primary MD. Please get your medications reviewed and adjusted by your Primary MD. ° °Please request your Primary MD to go over all Hospital Tests and Procedure/Radiological results at the follow up, please get all Hospital records sent to your Prim MD by signing hospital release before you go home. ° °If you had Pneumonia of Lung problems at the Hospital: °Please get a 2 view Chest X ray done in 6-8 weeks after hospital discharge or sooner if instructed by your Primary MD. ° °If you have Congestive Heart Failure: °Please call your Cardiologist or Primary MD anytime you have any of the following symptoms:  °1) 3 pound weight gain in 24 hours or 5 pounds in 1 week  °2) shortness of breath, with or without a dry hacking cough  °3) swelling in the hands, feet or stomach  °4) if you have to sleep on extra pillows at night in order to breathe ° °Follow cardiac low salt diet and 1.5 lit/day fluid restriction. ° °If you have diabetes °Accuchecks 4 times/day, Once in AM empty stomach and then before each meal. °Log in all results and show them to your primary doctor at your next visit. °If any glucose reading is under 80 or above 300 call your primary MD immediately. ° °If you have Seizure/Convulsions/Epilepsy: °Please do not drive, operate heavy machinery, participate in activities at heights or participate in high speed sports until you have seen by Primary MD or a Neurologist and advised to do so again. ° °If you had Gastrointestinal Bleeding: °Please ask your Primary MD to check a complete blood count within one week of discharge or at your next visit. Your endoscopic/colonoscopic biopsies that are pending at the time of discharge, will also need to followed by your Primary MD. ° °Get Medicines reviewed and adjusted. °Please take all your  medications with you for your next visit with your Primary MD ° °Please request your Primary MD to go over all hospital tests and procedure/radiological results at the follow up, please ask your Primary MD to get all Hospital records sent to his/her office. ° °If you experience worsening of your admission symptoms, develop shortness of breath, life threatening emergency, suicidal or homicidal thoughts you must seek medical attention immediately by calling 911 or calling your MD immediately  if symptoms less severe. ° °You must read complete instructions/literature along with all the possible adverse reactions/side effects for all the Medicines you take and that have been prescribed to you. Take any new Medicines after you have completely understood and accpet all the possible adverse reactions/side effects.  ° °Do not drive or operate heavy machinery when taking Pain medications.  ° °Do not take more than prescribed Pain, Sleep and Anxiety Medications ° °Special Instructions: If you have smoked or chewed Tobacco  in the last 2 yrs please stop smoking, stop any regular Alcohol  and or any Recreational drug use. ° °Wear Seat belts while driving. ° °Please note °You were cared for by a hospitalist during your hospital stay. If you have any questions about your discharge medications or the care you received while you were in the hospital after you are discharged, you can call the unit and asked to speak with the hospitalist on call if the hospitalist that took care of you is not available. Once   you are discharged, your primary care physician will handle any further medical issues. Please note that NO REFILLS for any discharge medications will be authorized once you are discharged, as it is imperative that you return to your primary care physician (or establish a relationship with a primary care physician if you do not have one) for your aftercare needs so that they can reassess your need for medications and monitor your  lab values. ° °You can reach the hospitalist office at phone [phone] or fax [phone] °  °If you do not have a primary care physician, you can call [phone] for a physician referral. ° °Activity: As tolerated with Full fall precautions use walker/cane & assistance as needed ° °Diet: regular ° °Disposition Home ° ° °

## 2015-02-08 NOTE — Progress Notes (Signed)
  Echocardiogram 2D Echocardiogram has been performed.  Arvil Chaco 02/08/2015, 1:07 PM

## 2015-02-09 LAB — HAPTOGLOBIN: HAPTOGLOBIN: 137 mg/dL (ref 34–200)

## 2015-02-09 LAB — HEMOGLOBIN A1C
HEMOGLOBIN A1C: 7.3 % — AB (ref 4.8–5.6)
MEAN PLASMA GLUCOSE: 163 mg/dL

## 2015-03-03 DIAGNOSIS — J189 Pneumonia, unspecified organism: Secondary | ICD-10-CM | POA: Insufficient documentation

## 2015-03-06 ENCOUNTER — Emergency Department (HOSPITAL_COMMUNITY): Payer: Medicare Other

## 2015-03-06 ENCOUNTER — Emergency Department (HOSPITAL_COMMUNITY)
Admission: EM | Admit: 2015-03-06 | Discharge: 2015-03-07 | Disposition: A | Discharge status: Home / Self Care | Payer: Medicare Other | Attending: Emergency Medicine | Admitting: Emergency Medicine

## 2015-03-06 ENCOUNTER — Encounter (HOSPITAL_COMMUNITY): Payer: Self-pay | Admitting: Emergency Medicine

## 2015-03-06 DIAGNOSIS — E119 Type 2 diabetes mellitus without complications: Secondary | ICD-10-CM | POA: Diagnosis not present

## 2015-03-06 DIAGNOSIS — R339 Retention of urine, unspecified: Secondary | ICD-10-CM | POA: Insufficient documentation

## 2015-03-06 DIAGNOSIS — I129 Hypertensive chronic kidney disease with stage 1 through stage 4 chronic kidney disease, or unspecified chronic kidney disease: Secondary | ICD-10-CM | POA: Insufficient documentation

## 2015-03-06 DIAGNOSIS — N183 Chronic kidney disease, stage 3 (moderate): Secondary | ICD-10-CM | POA: Insufficient documentation

## 2015-03-06 DIAGNOSIS — R059 Cough, unspecified: Secondary | ICD-10-CM

## 2015-03-06 DIAGNOSIS — K5900 Constipation, unspecified: Secondary | ICD-10-CM | POA: Insufficient documentation

## 2015-03-06 DIAGNOSIS — I480 Paroxysmal atrial fibrillation: Secondary | ICD-10-CM | POA: Insufficient documentation

## 2015-03-06 DIAGNOSIS — R05 Cough: Secondary | ICD-10-CM | POA: Insufficient documentation

## 2015-03-06 DIAGNOSIS — Z79899 Other long term (current) drug therapy: Secondary | ICD-10-CM | POA: Diagnosis not present

## 2015-03-06 DIAGNOSIS — R531 Weakness: Secondary | ICD-10-CM | POA: Insufficient documentation

## 2015-03-06 DIAGNOSIS — R109 Unspecified abdominal pain: Secondary | ICD-10-CM | POA: Diagnosis present

## 2015-03-06 LAB — BASIC METABOLIC PANEL
ANION GAP: 10 (ref 5–15)
BUN: 23 mg/dL — ABNORMAL HIGH (ref 6–20)
CALCIUM: 9.5 mg/dL (ref 8.9–10.3)
CO2: 25 mmol/L (ref 22–32)
CREATININE: 1.3 mg/dL — AB (ref 0.61–1.24)
Chloride: 93 mmol/L — ABNORMAL LOW (ref 101–111)
GFR calc Af Amer: 53 mL/min — ABNORMAL LOW (ref >60)
GFR, EST NON AFRICAN AMERICAN: 46 mL/min — AB (ref >60)
GLUCOSE: 220 mg/dL — AB (ref 65–99)
Potassium: 5.1 mmol/L (ref 3.5–5.1)
Sodium: 128 mmol/L — ABNORMAL LOW (ref 135–145)

## 2015-03-06 LAB — I-STAT TROPONIN, ED: TROPONIN I, POC: 0.02 ng/mL (ref 0.00–0.08)

## 2015-03-06 LAB — CBC
HCT: 34.7 % — ABNORMAL LOW (ref 39.0–52.0)
HEMOGLOBIN: 10.9 g/dL — AB (ref 13.0–17.0)
MCH: 29.2 pg (ref 26.0–34.0)
MCHC: 31.4 g/dL (ref 30.0–36.0)
MCV: 93 fL (ref 78.0–100.0)
PLATELETS: 258 10*3/uL (ref 150–400)
RBC: 3.73 MIL/uL — ABNORMAL LOW (ref 4.22–5.81)
RDW: 16.4 % — AB (ref 11.5–15.5)
WBC: 11.9 10*3/uL — ABNORMAL HIGH (ref 4.0–10.5)

## 2015-03-06 MED ORDER — MORPHINE SULFATE (PF) 2 MG/ML IV SOLN
2.0000 mg | Freq: Once | INTRAVENOUS | Status: AC
  Administered 2015-03-07: 2 mg via INTRAVENOUS
  Filled 2015-03-06: qty 1

## 2015-03-06 MED ORDER — BENZONATATE 100 MG PO CAPS
100.0000 mg | ORAL_CAPSULE | Freq: Once | ORAL | Status: AC
  Administered 2015-03-07: 100 mg via ORAL
  Filled 2015-03-06: qty 1

## 2015-03-06 MED ORDER — SODIUM CHLORIDE 0.9 % IV BOLUS (SEPSIS)
500.0000 mL | Freq: Once | INTRAVENOUS | Status: AC
  Administered 2015-03-07: 500 mL via INTRAVENOUS

## 2015-03-06 NOTE — ED Provider Notes (Signed)
CSN: [id number]   Arrival date & time 03/06/15 1958  History  By signing my name below, I, Bethel BornBritney McCollum, attest that this documentation has been prepared under the direction and in the presence of Melene Planan Tongela Encinas, DO. Electronically Signed: Bethel BornBritney McCollum, ED Scribe. 03/06/2015. 11:47 PM.  Chief Complaint  Patient presents with  . Chest Pain    HPI The history is provided by the patient. No language interpreter was used.   Clayton [name] is a 81 y.o. male with history of HT, DM, CKD, and kidney stones who presents to the Emergency Department complaining of new and constant pain at both sides of the abdomen with onset today. The pt was discharged to day after being treated for aspirated pneumonitis. At that time the pain was in the left side of his chest. He notes that this pain radiates to the left lower back and is different than pain that he has had in the past with kidney stones. His lidocaine patch and Tylenol provided insufficient relief in pain at home. He has had a dry cough for 2 weeks and coughing exacerbates the abdominal pain. Family at the bedside states that the pt is "too weak" to walk. Last bowel movement was 6 days ago.  No fever, chills, nausea, and vomiting.  Past Medical History  Diagnosis Date  . Macular degeneration   . Hypertension   . Diabetes mellitus without complication (HCC)   . paroxysmal afib   . CKD (chronic kidney disease), stage III     Past Surgical History  Procedure Laterality Date  . Orif humerus fracture Right 06/26/2012    Procedure: OPEN REDUCTION INTERNAL FIXATION (ORIF) PROXIMAL HUMERUS FRACTURE, right ;  Surgeon: Budd PalmerMichael H Handy, MD;  Location: [facility] OR;  Service: Orthopedics;  Laterality: Right;    Family History  Problem Relation Age of Onset  . Aneurysm Father     Aortic aneurysm    Social History  Substance Use Topics  . Smoking status: Never Smoker   . Smokeless tobacco: None  . Alcohol Use: No     Review of Systems  Constitutional:  Negative for fever and chills.  HENT: Negative for congestion and facial swelling.   Eyes: Negative for discharge and visual disturbance.  Respiratory: Positive for cough. Negative for shortness of breath.   Cardiovascular: Negative for chest pain and palpitations.  Gastrointestinal: Positive for abdominal pain and constipation. Negative for nausea, vomiting and diarrhea.  Musculoskeletal: Negative for myalgias and arthralgias.  Skin: Negative for color change and rash.  Neurological: Positive for weakness. Negative for tremors, syncope and headaches.  Psychiatric/Behavioral: Negative for confusion and dysphoric mood.   Home Medications   Prior to Admission medications   Medication Sig Start Date End Date Taking? Authorizing Provider  dextromethorphan-guaiFENesin (MUCINEX DM) 30-600 MG 12hr tablet Take 1 tablet by mouth every 12 (twelve) hours.   Yes Historical Provider, MD  lidocaine (LIDODERM) 5 % Place 1 patch onto the skin every 12 (twelve) hours. 03/06/15 04/05/15 Yes Historical Provider, MD  lisinopril (PRINIVIL,ZESTRIL) 10 MG tablet Take 10 mg by mouth daily.   Yes Historical Provider, MD  metFORMIN (GLUCOPHAGE) 1000 MG tablet Take 1,000 mg by mouth 2 (two) times daily.   Yes Historical Provider, MD  traMADol (ULTRAM) 50 MG tablet Take 50 mg by mouth every 8 (eight) hours as needed for moderate pain.  03/06/15  Yes Historical Provider, MD  benzonatate (TESSALON) 100 MG capsule Take 1 capsule (100 mg total) by mouth every 8 (eight) hours.  03/07/15   Melene Plan, DO  ferrous gluconate (FERGON) 324 MG tablet Take 1 tablet (324 mg total) by mouth daily with breakfast. 02/09/15   Leatha Gilding, MD  Multiple Vitamins-Minerals (ICAPS PO) Take 1 capsule by mouth daily.    Historical Provider, MD    Allergies  Sulfa antibiotics  Triage Vitals: BP 186/76 mmHg  Pulse 83  Temp(Src) 98.2 F (36.8 C) (Oral)  Resp 22  SpO2 94%  Physical Exam  Constitutional: He is oriented to person,  place, and time. He appears well-developed and well-nourished.  HENT:  Head: Normocephalic and atraumatic.  Eyes: EOM are normal. Pupils are equal, round, and reactive to light.  Neck: Normal range of motion. Neck supple. No JVD present.  Cardiovascular: Normal rate and regular rhythm.  Exam reveals no gallop and no friction rub.   No murmur heard. Pulmonary/Chest: No respiratory distress. He has no wheezes. He has rhonchi.  Scattered rhonchi worse in the bases bilaterally.  Abdominal: He exhibits no distension. There is tenderness. There is no rebound, no guarding and negative Murphy's sign.  Mild diffuse abdominal tenderness.  Musculoskeletal: Normal range of motion.  No noted edema.   Neurological: He is alert and oriented to person, place, and time.  Skin: No rash noted. No pallor.  Psychiatric: He has a normal mood and affect. His behavior is normal.  Nursing note and vitals reviewed.   ED Course  Procedures   DIAGNOSTIC STUDIES: Oxygen Saturation is 94% on RA, normal by my interpretation.    COORDINATION OF CARE: 11:39 PM Discussed treatment plan which includes lab work, CT renal stone, CXR, EKG, morphine, Tessalon, and IVF with pt at bedside and pt agreed to plan.  Labs Reviewed  BASIC METABOLIC PANEL - Abnormal; Notable for the following:    Sodium 128 (*)    Chloride 93 (*)    Glucose, Bld 220 (*)    BUN 23 (*)    Creatinine, Ser 1.30 (*)    GFR calc non Af Amer 46 (*)    GFR calc Af Amer 53 (*)    All other components within normal limits  CBC - Abnormal; Notable for the following:    WBC 11.9 (*)    RBC 3.73 (*)    Hemoglobin 10.9 (*)    HCT 34.7 (*)    RDW 16.4 (*)    All other components within normal limits  HEPATIC FUNCTION PANEL - Abnormal; Notable for the following:    Bilirubin, Direct <0.1 (*)    All other components within normal limits  LIPASE, BLOOD  URINALYSIS, ROUTINE W REFLEX MICROSCOPIC (NOT AT [facility])  Rosezena Sensor, ED    Imaging  Review Dg Chest 2 View  03/06/2015  CLINICAL DATA:  Initial encounter for left flank pain after discharge from outside hospital today. EXAM: CHEST  2 VIEW COMPARISON:  02/07/2015. FINDINGS: The lungs are clear without focal pneumonia, edema, pneumothorax or pleural effusion. Interstitial markings are diffusely coarsened with chronic features. The cardio pericardial silhouette is enlarged. Moderate to large hiatal hernia noted. Bones are diffusely demineralized. IMPRESSION: Stable.  No acute cardiopulmonary findings. Moderate to large hiatal hernia. Electronically Signed   By: Kennith Center M.D.   On: 03/06/2015 20:43   Ct Renal Stone Study  03/07/2015  CLINICAL DATA:  Left flank pain and left lower quadrant pain. EXAM: CT ABDOMEN AND PELVIS WITHOUT CONTRAST TECHNIQUE: Multidetector CT imaging of the abdomen and pelvis was performed following the standard protocol without IV contrast. COMPARISON:  None. FINDINGS: Both kidneys appear atrophic. Bilateral nephrolithiasis. No hydronephrosis. No ureteral calculus. No ureterectasis or periureteral stranding. There is marked distention of the urinary bladder, measuring over 20 cm in maximum diameter. There are unremarkable unenhanced appearances of the liver, spleen, pancreas and adrenals. There is a large hiatal hernia. Small bowel and colon is remarkable only for uncomplicated colonic diverticulosis. The abdominal aorta is normal in caliber with extensive atherosclerotic calcification. No acute findings are evident in the lower chest. There is prior vertebroplasty of T12 and L1. There are central compressions due to endplate impactions at L2, L3 and L5, more likely chronic. No significant skeletal lesions are evident. IMPRESSION: 1. Marked urinary bladder distention. 2. Bilateral nephrolithiasis. Both kidneys appear atrophic. No ureteral calculi. 3. Large hiatal hernia. 4. Colonic diverticulosis. Electronically Signed   By: Ellery Plunk M.D.   On: 03/07/2015  01:25    I personally reviewed and evaluated these images and lab results as a part of my medical decision-making.   EKG Interpretation  Date/Time:  Monday March 06 2015 20:05:00 EDT Ventricular Rate:  92 PR Interval:  174 QRS Duration: 124 QT Interval:  366 QTC Calculation: 452 R Axis:   95 Text Interpretation:  Normal sinus rhythm Right bundle branch block Abnormal ECG No significant change since last tracing Confirmed by Bill Yohn MD, Reuel Boom ([id number]) on 03/06/2015 11:48:02 PM         MDM   Final diagnoses:  Left flank pain  Urinary retention  Constipation, unspecified constipation type  Cough    Patient is a 81 y.o. male who presents with left flank pain.  This started a couple days ago.  Patient was recently admitted for a week in [facility] for [place] diagnosis aspiration pneumonitis. Patient has continued to cough persistently since then. Pain is worse with cough, movement. Family denies fevers or chills denies vomiting or diarrhea. They thought he also had a dilated colon at the OSH.  Currently on no continued antibiotics.   Patient with a elevated white blood cell count. Pain localized to the abdomen. Worse with cough suspect likely muscular in nature, patient however unable to ambulate. Will obtain a CT scan of the abdomen and pelvis. Chest x-ray with large hiatal hernia no significant lung findings.   CT scan concerning for largely distended urinary bladder. Foley placed patient had a liter immediately output. Patient feeling much better. Foley clamped.  Patient able to ambulate with a walker. Patient has a walker at home and that time he normally gets around. Feeling much better after Foley placement. Family will follow with their family doctor tomorrow. Urology follow-up in one week for trial of void.   I personally performed the services described in this documentation, which was scribed in my presence. The recorded information has been reviewed and is  accurate.  3:19 AM:  I have discussed the diagnosis/risks/treatment options with the patient and family and believe the pt to be eligible for discharge home to follow-up with PCP, Urology. We also discussed returning to the ED immediately if new or worsening sx occur. We discussed the sx which are most concerning (e.g., sudden worsening pain, fever, inability to tolerate by mouth) that necessitate immediate return. Medications administered to the patient during their visit and any new prescriptions provided to the patient are listed below.  Medications given during this visit Medications  morphine 2 MG/ML injection 2 mg (2 mg Intravenous Given 03/07/15 0001)  benzonatate (TESSALON) capsule 100 mg (100 mg Oral Given 03/07/15 0001)  sodium chloride 0.9 % bolus 500 mL (0 mLs Intravenous Stopped 03/07/15 0146)  lidocaine (XYLOCAINE) 2 % jelly 1 application (1 application Topical Given 03/07/15 0204)    New Prescriptions   BENZONATATE (TESSALON) 100 MG CAPSULE    Take 1 capsule (100 mg total) by mouth every 8 (eight) hours.    The patient appears reasonably screen and/or stabilized for discharge and I doubt any other medical condition or other [facility] requiring further screening, evaluation, or treatment in the ED at this time prior to discharge.      Melene Plan, DO 03/07/15 [phone]

## 2015-03-06 NOTE — ED Notes (Signed)
Pt reports he was discharged from [facility] today for pain in L abdomen. Pt reports  L flank pain that started after discharge. Pt denies any other sx.

## 2015-03-06 NOTE — ED Notes (Signed)
MD at the bedside  

## 2015-03-07 ENCOUNTER — Emergency Department (HOSPITAL_COMMUNITY): Payer: Medicare Other

## 2015-03-07 LAB — HEPATIC FUNCTION PANEL
ALBUMIN: 3.9 g/dL (ref 3.5–5.0)
ALK PHOS: 73 U/L (ref 38–126)
ALT: 17 U/L (ref 17–63)
AST: 30 U/L (ref 15–41)
Bilirubin, Direct: <0.1 mg/dL — ABNORMAL LOW (ref 0.1–0.5)
TOTAL PROTEIN: 7.3 g/dL (ref 6.5–8.1)
Total Bilirubin: 0.6 mg/dL (ref 0.3–1.2)

## 2015-03-07 LAB — URINALYSIS, ROUTINE W REFLEX MICROSCOPIC
Bilirubin Urine: NEGATIVE
Glucose, UA: NEGATIVE mg/dL
Ketones, ur: NEGATIVE mg/dL
Nitrite: NEGATIVE
Protein, ur: NEGATIVE mg/dL
Specific Gravity, Urine: 1.012 (ref 1.005–1.030)
Urobilinogen, UA: 0.2 mg/dL (ref 0.0–1.0)
pH: 6.5 (ref 5.0–8.0)

## 2015-03-07 LAB — URINE MICROSCOPIC-ADD ON

## 2015-03-07 LAB — LIPASE, BLOOD: Lipase: 17 U/L (ref 11–51)

## 2015-03-07 MED ORDER — BENZONATATE 100 MG PO CAPS: 100.0000 mg | ORAL_CAPSULE | Freq: Three times a day (TID) | ORAL | Status: DC

## 2015-03-07 MED ORDER — LIDOCAINE HCL 2 % EX GEL
1.0000 "application " | Freq: Once | CUTANEOUS | Status: AC
  Administered 2015-03-07: 1 via TOPICAL
  Filled 2015-03-07: qty 20

## 2015-03-07 NOTE — Discharge Instructions (Signed)
Follow up with your doctor.  Take miralax one scoop in 8 oz of water once a day for the next couple days.  Return for fever, sudden worsening pain. Take tylenol for pain.  Stop the tramadol.  Acute Urinary Retention, Male Acute urinary retention is the temporary inability to urinate. This is a common problem in older men. As men age their prostates become larger and block the flow of urine from the bladder. This is usually a problem that has come on gradually.  HOME CARE INSTRUCTIONS If you are sent home with a Foley catheter and a drainage system, you will need to discuss the best course of action with your health care provider. While the catheter is in, maintain a good intake of fluids. Keep the drainage bag emptied and lower than your catheter. This is so that contaminated urine will not flow back into your bladder, which could lead to a urinary tract infection. There are two main types of drainage bags. One is a large bag that usually is used at night. It has a good capacity that will allow you to sleep through the night without having to empty it. The second type is called a leg bag. It has a smaller capacity, so it needs to be emptied more frequently. However, the main advantage is that it can be attached by a leg strap and can go underneath your clothing, allowing you the freedom to move about or leave your home. Only take over-the-counter or prescription medicines for pain, discomfort, or fever as directed by your health care provider.  SEEK MEDICAL CARE IF:  You develop a low-grade fever.  You experience spasms or leakage of urine with the spasms. SEEK IMMEDIATE MEDICAL CARE IF:   You develop chills or fever.  Your catheter stops draining urine.  Your catheter falls out.  You start to develop increased bleeding that does not respond to rest and increased fluid intake. MAKE SURE YOU:  Understand these instructions.  Will watch your condition.  Will get help right away if you are  not doing well or get worse.   This information is not intended to replace advice given to you by your health care provider. Make sure you discuss any questions you have with your health care provider.   Document Released: 07/29/2000 Document Revised: 09/06/2014 Document Reviewed: 10/01/2012 Elsevier Interactive Patient Education [employer]2016 Elsevier Inc.

## 2015-03-07 NOTE — ED Notes (Addendum)
Pt returned from CT scan and placed back on monitor.

## 2015-03-07 NOTE — ED Notes (Signed)
Patient transported to CT 

## 2015-03-09 ENCOUNTER — Telehealth: Payer: Self-pay | Admitting: *Deleted

## 2015-03-09 NOTE — Telephone Encounter (Signed)
Noreene LarssonJill (Office of Pt experience) called to inquire about HH set up for pt.  Noreene LarssonJill spoke with pt daughter [name](Laurie) [facility] promised by EDDO Adela Lank(Floyd). NCM reviewed chart to find face to face order, but no consult to CM or message in inbox.  After speaking with daughter, [facility].  NCM notified Noreene LarssonJill and Jacki ConesLaurie of [facility].

## 2015-03-13 ENCOUNTER — Encounter: Payer: Self-pay | Admitting: Physician Assistant

## 2015-04-03 ENCOUNTER — Ambulatory Visit (INDEPENDENT_AMBULATORY_CARE_PROVIDER_SITE_OTHER): Payer: Medicare Other | Admitting: Physician Assistant

## 2015-04-03 ENCOUNTER — Encounter: Payer: Self-pay | Admitting: Physician Assistant

## 2015-04-03 VITALS — BP 164/82 | HR 84 | Ht 66.5 in | Wt 132.5 lb

## 2015-04-03 DIAGNOSIS — R1310 Dysphagia, unspecified: Secondary | ICD-10-CM

## 2015-04-03 DIAGNOSIS — K5909 Other constipation: Secondary | ICD-10-CM | POA: Diagnosis not present

## 2015-04-03 DIAGNOSIS — D509 Iron deficiency anemia, unspecified: Secondary | ICD-10-CM

## 2015-04-03 DIAGNOSIS — R935 Abnormal findings on diagnostic imaging of other abdominal regions, including retroperitoneum: Secondary | ICD-10-CM | POA: Diagnosis not present

## 2015-04-03 NOTE — Progress Notes (Signed)
Patient ID: Clayton [name], male   DOB: 04-Jul-1919, 81 y.o.   MRN: [id number]   Subjective:    Patient ID: Clayton [name], male    DOB: [birth year], 81 y.o.   MRN: [id number]  HPI  Clayton [name] is a pleasant 81 year old white male referred by Dr. Adela Lank with several issues. He is new to GI. He had an admission on 02/07/2015 with syncope which was determined to be vasovagal. Apparently also noted at that time to be anemic with hemoglobin of 9.6. He had iron studies done which did show iron deficiency with iron of 24 TIBC 435 and iron saturation of 6. He was documented Hemoccult negative 2. He has been on an iron supplement since and hemoglobin has improved. When checked on 1031 hemoglobin up to 10.9 hematocrit of 34.7. He then had admission briefly in [facility] with a pneumonia which was felt possibly aspiration related. He then had ER visit on 03/06/2015 with complaints of abdominal discomfort. He was found to have significant urinary retention. He had CT of the abdomen and pelvis done with stone protocol and was found to have a markedly distended urinary bladder. He had Foley catheter placed with large volume of residual and has had a catheter since that time and is undergoing urologic evaluation. Apparently around that same time he had been constipated he was given a bowel prep by the urologist and is using MiraLAX on a daily basis since. He has no current complaints of abdominal pain or discomfort Bauserman moving regularly no melena or hematochezia. Apostol or had him stop the iron supplement due to the constipation. He has had some issues with swallowing apparently over the past 6-8 months. He says he has no difficulty with liquids and does not have coughing or choking episodes when swallowing. He says he has some difficulty moving the food bolus from his tongue to the back of his throat and also feels that food is sticking in his esophagus. He is not having regurgitation or pain.  Appetite has been fair, weight is apparently stable. He has not had prior EGD or colonoscopy.   Other medical problems include chronic kidney disease, atrial fibrillation, adult-onset diabetes mellitus. Daughter also reports that the patient had CT of the abdomen and pelvis done for some reason during his hospitalization in [facility] recently and they were told that he had some thickening of the left colon on that scan. I do not have a copy of that report.  Review of Systems Pertinent positive and negative review of systems were noted in the above HPI section.  All other review of systems was otherwise negative.  Outpatient Encounter Prescriptions as of 04/03/2015  Medication Sig  . ferrous gluconate (FERGON) 324 MG tablet Take 1 tablet (324 mg total) by mouth daily with breakfast.  . lisinopril (PRINIVIL,ZESTRIL) 10 MG tablet Take 10 mg by mouth daily.  . metFORMIN (GLUCOPHAGE) 1000 MG tablet Take 1,000 mg by mouth 2 (two) times daily.  . Multiple Vitamins-Minerals (ICAPS PO) Take 1 capsule by mouth daily.  . tamsulosin (FLOMAX) 0.4 MG CAPS capsule Take 0.4 mg by mouth daily after supper.  . benzonatate (TESSALON) 100 MG capsule Take 1 capsule (100 mg total) by mouth every 8 (eight) hours. (Patient not taking: Reported on 04/03/2015)  . dextromethorphan-guaiFENesin (MUCINEX DM) 30-600 MG 12hr tablet Take 1 tablet by mouth as needed.   . [DISCONTINUED] lidocaine (LIDODERM) 5 % Place 1 patch onto the skin every 12 (twelve) hours.  . [  DISCONTINUED] traMADol (ULTRAM) 50 MG tablet Take 50 mg by mouth every 8 (eight) hours as needed for moderate pain.    No facility-administered encounter medications on file as of 04/03/2015.   Allergies  Allergen Reactions  . Sulfa Antibiotics Itching   Patient Active Problem List   Diagnosis Date Noted  . Acute renal failure superimposed on stage 3 chronic kidney disease (HCC) 02/07/2015  . Syncope 02/07/2015  . Normocytic anemia 02/07/2015    . Hyperkalemia 02/07/2015  . Diabetes mellitus without complication (HCC)   . paroxysmal afib   . Absolute anemia   . Faintness   . Absent pedal pulses 12/28/2014  . Essential hypertension, benign 06/27/2012  . Proximal humerus fracture, right 06/24/2012   Social History   Social History  . Marital Status: Married    Spouse Name: N/A  . Number of Children: 3  . Years of Education: N/A   Occupational History  . retired    Social History Main Topics  . Smoking status: Former Smoker    Types: Cigars    Quit date: 05/06/1969  . Smokeless tobacco: Never Used  . Alcohol Use: No  . Drug Use: No  . Sexual Activity: Not on file   Other Topics Concern  . Not on file   Social History Narrative    Clayton [name]'s family history includes Aneurysm in his father; Gallbladder disease in his mother; Macular degeneration in his mother and sister; Pancreatitis in his sister.      Objective:    Filed Vitals:   04/03/15 1354  BP: 164/82  Pulse: 84    Physical Exam   well-developed elderly white male in no acute distress, pleasant accompanied by his daughter who gives most of history blood pressure 164/82 pulse 84 height 5 foot 6 weight 132. HEENT; nontraumatic, cephalic EOMI PERRLA sclera anicteric, Cardiovascular; regular rate and rhythm with S1-S2 no murmur or gallop, Pulmonary; clear bilaterally, Abdomen ;soft nontender nondistended bowel sounds are active there is no palpable mass or hepatosplenomegaly he does have a Foley catheter in place, Rectal; exam not done,Ext;no clubbing cyanosis or edema,skin warm and dry, Neuropsych ;mood and affect appropriate      Assessment & Plan:   [age] yo WM with dysphagia to solids x 6-8 months- R/O motility disorder vs stricture #2 recent pneumonia- ? Aspiration related #3 Fe deficiency  Anemia/ heme negative  #4 Constipation  #5 ? [facility] #6 Urinary retention - current Foley #7 AODM #8 CKD stage 3 #9 atrial  fib  Plan; Obtain records fro recent admission in [place] Schedule for Barium swallow with tablet  Continue daily miralax 17 gm in 8 oz water  Consider Colonoscopy after discussed with Dr Leone PayorGessner, and records/Ct reviewed Daughter requests pt be established with Dr Leone PayorGessner.   Amy Oswald HillockS Esterwood PA-C 04/03/2015   Cc: Darci NeedleKohut, Walter, MD

## 2015-04-03 NOTE — Patient Instructions (Signed)
You have been scheduled for a Barium Esophogram at [facility] (1st floor of the hospital) on Thursday 04-06-2015 at 10:30 am . Please arrive 15 minutes prior to your appointment for registration. Make certain not to have anything to eat or drink 6 hours prior to your test. If you need to reschedule for any reason, please contact radiology at [phone] to do so. __________________________________________________________________ A barium swallow is an examination that concentrates on views of the esophagus. This tends to be a double contrast exam (barium and two liquids which, when combined, create a gas to distend the wall of the oesophagus) or single contrast (non-ionic iodine based). The study is usually tailored to your symptoms so a good history is essential. Attention is paid during the study to the form, structure and configuration of the esophagus, looking for functional disorders (such as aspiration, dysphagia, achalasia, motility and reflux) EXAMINATION You may be asked to change into a gown, depending on the type of swallow being performed. A radiologist and radiographer will perform the procedure. The radiologist will advise you of the type of contrast selected for your procedure and direct you during the exam. You will be asked to stand, sit or lie in several different positions and to hold a small amount of fluid in your mouth before being asked to swallow while the imaging is performed .In some instances you may be asked to swallow barium coated marshmallows to assess the motility of a solid food bolus. The exam can be recorded as a digital or video fluoroscopy procedure. POST PROCEDURE It will take 1-2 days for the barium to pass through your system. To facilitate this, it is important, unless otherwise directed, to increase your fluids for the next 24-48hrs and to resume your normal diet.  This test typically takes about 30 minutes to  perform. __________________________________________________________________________________  We [facility] [place], [place].  We will call you with the results of the Barium Swallow ( Esophagram).

## 2015-04-05 NOTE — Progress Notes (Signed)
CT ANGIOGRAPHY OF THE CHEST, ABDOMEN AND PELVIS (DISSECTION PROTOCOL).  Comparison: None  100 mL  of Omnipaque 350 was given intravenously without immediate complication.  CT was performed before and after contrast administration.  Two-dimensional and 3-dimensional reformatted angiographic images were performed.  CT ANGIOGRAPHY OF THE CHEST FINDINGS  Cardiomegaly is present. Diffuse atherosclerotic changes are seen of the aorta. This particularly applies to the aortic arch where there is calcific plaquing and some thrombus. No aortic dissection is seen. There are penetrating ulcers within plaques within the aortic arch. Diffuse coronary artery calcifications are noted. A large hiatal hernia is present.  Interstitial opacities are seen in the lung bases.  CT ANGIOGRAPHY OF THE ABDOMEN FINDINGS  Atherosclerotic changes are seen of the aorta with calcific plaquing, thrombosis and noncalcific plaquing. There is a 2.8 cm aneurysm distally of the abdominal aorta. No acute dissection is seen. Moderate stenosis is seen of the proximal superior mesenteric artery and celiac artery. Severe stenosis appears present at the origin of both renal arteries..   The liver, spleen and pancreas are unremarkable. Approximately 7 calculi are seen in the right kidney and 1 in the left kidney. The largest is on the right side measuring 9 mm in the upper pole. No hydronephrosis is seen.  CT ANGIOGRAPHY OF THE PELVIS  Urinary bladder is distended. Diffuse diverticulosis is seen particularly of the sigmoid colon. Questionable wall thickening is seen of the colon at the splenic flexure.  Osteopenia is noted. Vertebral plasty is seen of the lower thoracic upper lumbar spine.  No dilated bowel loops are seen.   CT scan found in Care everywhere - large hiatal hernia may be source of iron def anemia and dysphagia/aspiration. Colon findings on CT would not lead me to colonoscopy.  Iva Booparl E. Gessner,  MD, Clementeen GrahamFACG

## 2015-04-05 NOTE — Progress Notes (Signed)
Agree with Ms. Esterwood's assessment and plan. Jenavi Beedle E. Eleni Frank, MD, FACG   

## 2015-04-06 ENCOUNTER — Ambulatory Visit (HOSPITAL_COMMUNITY): Payer: Medicare Other

## 2015-04-10 ENCOUNTER — Ambulatory Visit (HOSPITAL_COMMUNITY)
Admission: RE | Admit: 2015-04-10 | Discharge: 2015-04-10 | Disposition: A | Discharge status: Home / Self Care | Payer: Medicare Other | Source: Ambulatory Visit | Attending: Physician Assistant | Admitting: Physician Assistant

## 2015-04-10 DIAGNOSIS — R1310 Dysphagia, unspecified: Secondary | ICD-10-CM | POA: Insufficient documentation

## 2015-04-10 DIAGNOSIS — K224 Dyskinesia of esophagus: Secondary | ICD-10-CM | POA: Diagnosis not present

## 2015-04-10 DIAGNOSIS — K449 Diaphragmatic hernia without obstruction or gangrene: Secondary | ICD-10-CM | POA: Insufficient documentation

## 2015-04-13 NOTE — Progress Notes (Signed)
Quick Note:  Spoke to daughter Bobbye RiggsLaurie Morris while she was with patient Needs modified barium swallow re: evaluate for aspiration  Please contact daughter on her cell to arrange. thanks ______

## 2015-04-14 ENCOUNTER — Other Ambulatory Visit (HOSPITAL_COMMUNITY): Payer: Self-pay | Admitting: Internal Medicine

## 2015-04-14 ENCOUNTER — Other Ambulatory Visit: Payer: Self-pay

## 2015-04-14 DIAGNOSIS — R1310 Dysphagia, unspecified: Secondary | ICD-10-CM

## 2015-04-14 NOTE — Progress Notes (Signed)
Needs scheduled for [facility]BS Message left at [phone]   Daughter notified of procedure scheduled [facility]03[facility]15 1[facility]

## 2015-05-03 ENCOUNTER — Ambulatory Visit (HOSPITAL_COMMUNITY)
Admission: RE | Admit: 2015-05-03 | Discharge: 2015-05-03 | Disposition: A | Discharge status: Home / Self Care | Payer: Medicare (Managed Care) | Source: Ambulatory Visit | Attending: Internal Medicine | Admitting: Internal Medicine

## 2015-05-03 DIAGNOSIS — Z8701 Personal history of pneumonia (recurrent): Secondary | ICD-10-CM | POA: Insufficient documentation

## 2015-05-03 DIAGNOSIS — R1310 Dysphagia, unspecified: Secondary | ICD-10-CM | POA: Diagnosis present

## 2015-05-04 NOTE — Progress Notes (Signed)
Quick Note:  Please let daughter (my neighbor) know that I have reviewed and recommend Speech therapy referral to help strengthen and improve swallowing as rec in report if she agrees - I will talk more to her upon return ______

## 2015-05-09 ENCOUNTER — Encounter: Payer: Self-pay | Admitting: Podiatry

## 2015-05-09 ENCOUNTER — Ambulatory Visit (INDEPENDENT_AMBULATORY_CARE_PROVIDER_SITE_OTHER): Payer: Medicare (Managed Care) | Admitting: Podiatry

## 2015-05-09 DIAGNOSIS — B351 Tinea unguium: Secondary | ICD-10-CM | POA: Diagnosis not present

## 2015-05-09 DIAGNOSIS — M79673 Pain in unspecified foot: Secondary | ICD-10-CM | POA: Diagnosis not present

## 2015-05-09 NOTE — Patient Instructions (Signed)
Return-year-old suggested at approximately 3-4 months for debridement of mycotic toenails  Diabetes and Foot Care Diabetes may cause you to have problems because of poor blood supply (circulation) to your feet and legs. This may cause the skin on your feet to become thinner, break easier, and heal more slowly. Your skin may become dry, and the skin may peel and crack. You may also have nerve damage in your legs and feet causing decreased feeling in them. You may not notice minor injuries to your feet that could lead to infections or more serious problems. Taking care of your feet is one of the most important things you can do for yourself.  HOME CARE INSTRUCTIONS  Wear shoes at all times, even in the house. Do not go barefoot. Bare feet are easily injured.  Check your feet daily for blisters, cuts, and redness. If you cannot see the bottom of your feet, use a mirror or ask someone for help.  Wash your feet with warm water (do not use hot water) and mild soap. Then pat your feet and the areas between your toes until they are completely dry. Do not soak your feet as this can dry your skin.  Apply a moisturizing lotion or petroleum jelly (that does not contain alcohol and is unscented) to the skin on your feet and to dry, brittle toenails. Do not apply lotion between your toes.  Trim your toenails straight across. Do not dig under them or around the cuticle. File the edges of your nails with an emery board or nail file.  Do not cut corns or calluses or try to remove them with medicine.  Wear clean socks or stockings every day. Make sure they are not too tight. Do not wear knee-high stockings since they may decrease blood flow to your legs.  Wear shoes that fit properly and have enough cushioning. To break in new shoes, wear them for just a few hours a day. This prevents you from injuring your feet. Always look in your shoes before you put them on to be sure there are no objects inside.  Do not  cross your legs. This may decrease the blood flow to your feet.  If you find a minor scrape, cut, or break in the skin on your feet, keep it and the skin around it clean and dry. These areas may be cleansed with mild soap and water. Do not cleanse the area with peroxide, alcohol, or iodine.  When you remove an adhesive bandage, be sure not to damage the skin around it.  If you have a wound, look at it several times a day to make sure it is healing.  Do not use heating pads or hot water bottles. They may burn your skin. If you have lost feeling in your feet or legs, you may not know it is happening until it is too late.  Make sure your health care provider performs a complete foot exam at least annually or more often if you have foot problems. Report any cuts, sores, or bruises to your health care provider immediately. SEEK MEDICAL CARE IF:   You have an injury that is not healing.  You have cuts or breaks in the skin.  You have an ingrown nail.  You notice redness on your legs or feet.  You feel burning or tingling in your legs or feet.  You have pain or cramps in your legs and feet.  Your legs or feet are numb.  Your feet always feel  cold. SEEK IMMEDIATE MEDICAL CARE IF:   There is increasing redness, swelling, or pain in or around a wound.  There is a red line that goes up your leg.  Pus is coming from a wound.  You develop a fever or as directed by your health care provider.  You notice a bad smell coming from an ulcer or wound.   This information is not intended to replace advice given to you by your health care provider. Make sure you discuss any questions you have with your health care provider.   Document Released: 04/19/2000 Document Revised: 12/23/2012 Document Reviewed: 09/29/2012 Elsevier Interactive Patient Education [employer].

## 2015-05-09 NOTE — Progress Notes (Signed)
Patient ID: Clayton SkyeWilliam G [name], male   DOB: 06/15/1919, 82 y.o.   MRN: [id number]  Subjective: This patient presents with daughter and treatment room who is requesting debridement of her father's toenails. She is concerned the toenails are thickened and elongated.  Objective: Patient does respond to questioning in a delayed fashion No open skin lesions bilaterally Urinary catheter noted The toenails are elongated, brittle, deformed, hypertrophic 6-10  Assessment: Mycotic toenails 6-10  Plan: Debridement toenails 6-10 mechanically and electronically without a bleeding  Reappoint at 4 month intervals or at request the patient or patient's daughter

## 2015-05-10 ENCOUNTER — Other Ambulatory Visit: Payer: Self-pay

## 2015-05-10 DIAGNOSIS — R1310 Dysphagia, unspecified: Secondary | ICD-10-CM

## 2015-05-10 DIAGNOSIS — D509 Iron deficiency anemia, unspecified: Secondary | ICD-10-CM

## 2015-05-10 NOTE — Progress Notes (Signed)
Quick Note:  Reviewed results with daughter - please order speech therapy dysphagia treatment   Also needs a cbc, B12 and ferritin dx: iron-def anemia  Must talk to daughter Jacki ConesLaurie as she transports him, etc ______

## 2015-05-11 ENCOUNTER — Encounter: Payer: Self-pay | Admitting: Internal Medicine

## 2015-05-11 ENCOUNTER — Other Ambulatory Visit (INDEPENDENT_AMBULATORY_CARE_PROVIDER_SITE_OTHER): Payer: Medicare Other

## 2015-05-11 ENCOUNTER — Ambulatory Visit: Payer: Medicare Other | Attending: Internal Medicine | Admitting: Speech Pathology

## 2015-05-11 DIAGNOSIS — D509 Iron deficiency anemia, unspecified: Secondary | ICD-10-CM

## 2015-05-11 DIAGNOSIS — R1312 Dysphagia, oropharyngeal phase: Secondary | ICD-10-CM | POA: Diagnosis not present

## 2015-05-11 LAB — CBC WITH DIFFERENTIAL/PLATELET
BASOS ABS: 0.1 10*3/uL (ref 0.0–0.1)
BASOS PCT: 0.6 % (ref 0.0–3.0)
Eosinophils Absolute: 0.8 10*3/uL — ABNORMAL HIGH (ref 0.0–0.7)
Eosinophils Relative: 8.1 % — ABNORMAL HIGH (ref 0.0–5.0)
HEMATOCRIT: 30.1 % — AB (ref 39.0–52.0)
Hemoglobin: 9.6 g/dL — ABNORMAL LOW (ref 13.0–17.0)
LYMPHS PCT: 11.4 % — AB (ref 12.0–46.0)
Lymphs Abs: 1.2 10*3/uL (ref 0.7–4.0)
MCHC: 31.9 g/dL (ref 30.0–36.0)
MCV: 91 fl (ref 78.0–100.0)
MONOS PCT: 10.2 % (ref 3.0–12.0)
Monocytes Absolute: 1.1 10*3/uL — ABNORMAL HIGH (ref 0.1–1.0)
NEUTROS ABS: 7.3 10*3/uL (ref 1.4–7.7)
Neutrophils Relative %: 69.7 % (ref 43.0–77.0)
PLATELETS: 241 10*3/uL (ref 150.0–400.0)
RBC: 3.31 Mil/uL — ABNORMAL LOW (ref 4.22–5.81)
RDW: 16 % — ABNORMAL HIGH (ref 11.5–15.5)
WBC: 10.5 10*3/uL (ref 4.0–10.5)

## 2015-05-11 LAB — FERRITIN: Ferritin: 10 ng/mL — ABNORMAL LOW (ref 22.0–322.0)

## 2015-05-11 LAB — VITAMIN B12: Vitamin B-12: 354 pg/mL (ref 211–911)

## 2015-05-11 NOTE — Therapy (Signed)
[facility] [address] [place], [place], [address] Phone: [phone]   Fax:  [phone]  Speech Language Pathology Evaluation  Patient Details  Name: Clayton [name] MRN: [id number] Date of Birth: 1919/08/18 No Data Recorded  Encounter Date: 05/11/2015      End of Session - 05/11/15 1405    Visit Number 1   Number of Visits 17   Date for SLP Re-Evaluation 07/20/15   SLP Start Time 1312   SLP Stop Time  1401   SLP Time Calculation (min) 49 min   Activity Tolerance Patient tolerated treatment well      Past Medical History  Diagnosis Date  . Macular degeneration   . Hypertension   . Diabetes mellitus without complication (HCC)   . paroxysmal afib   . CKD (chronic kidney disease), stage III   . Kidney stones   . Distended bladder   . Hiatal hernia   . Diverticulosis     on scan  . Syncopal episodes     Past Surgical History  Procedure Laterality Date  . Orif humerus fracture Right 06/26/2012    Procedure: OPEN REDUCTION INTERNAL FIXATION (ORIF) PROXIMAL HUMERUS FRACTURE, right ;  Surgeon: Budd PalmerMichael H Handy, MD;  Location: [facility] OR;  Service: Orthopedics;  Laterality: Right;  . Kidney stone surgery      since young age    There were no vitals filed for this visit.  Visit Diagnosis: Dysphagia, oropharyngeal - Plan: SLP plan of care cert/re-cert      Subjective Assessment - 05/11/15 1320    Subjective "He had aspiration pnuemonitis   Patient is accompained by: Family member   Special Tests daugher Clayton [name]            [facility] - 05/11/15 0001    Prior Functional Status   Cognitive/Linguistic Baseline Within functional limits   Type of Home Independent living facility    Lives With Spouse   Available Support Family   Vocation Retired   Pain Assessment   Pain Assessment 0-10   Pain Score 10-Worst pain ever   Pain Location left back   Pain Descriptors / Indicators Constant;Sharp   Pain Intervention(s) Monitored during session          Subjective Assessment - 05/11/15 1403    Symptoms/Limitations   Subjective "He had aspiration pnuemonitis   Patient is accompained by: Family member   Special Tests daugher Clayton [name]         Prior Functional Status - 05/11/15 1403    Prior Functional Status   Cognitive/Linguistic Baseline Within functional limits   Type of Home Independent living facility    Lives With Spouse   Available Help at Discharge Family   Vocation Retired         General - 05/11/15 1403    General Information   Date of Onset 05/03/15   HPI 82 yo male [facility] to assess for aspiration per referral.  Pt has PMH + for syncope x2, anemia, CKD, foley catheter use, macular degeneration, aspiration pneumonitis Oct 2016, and remote h/o smoking.  Daughter arrived with pt and both report pt has not had unintentional weight loss nor has he required heimlich maneuver.  He admits to difficulty transiting food more than liquid from mouth into throat.  Brain MRI negative for acute event and showed atrophy 02/2015.     Type of Study Bedside Swallow Evaluation   Diet Prior to this  Study Regular;Thin liquids   Temperature Spikes Noted No   Behavior/Cognition Alert;Cooperative;Pleasant mood   Oral Cavity - Dentition Adequate natural dentition   Vision Functional for self-feeding   Self-Feeding Abilities Able to feed self   Baseline Vocal Quality Normal   Volitional Cough Weak   Volitional Swallow Able to elicit          Oral Motor/Sensory Function - 05/11/15 1404    Oral Motor/Sensory Function   Overall Oral Motor/Sensory Function Within functional limits          Thin Liquid - 05/11/15 1405    Thin Liquid   Thin Liquid Within functional limits   Presentation Cup;Spoon              ADULT SLP TREATMENT - 05/11/15 0001    Cognitive-Linquistic Treatment   Skilled Treatment Initiated training of dysphagia HEP  with usuall min to mod A - pt requried instructions, verbal and visual cues. At this time, pt is max A with laryngeal breath hold. Simple HEP to be performed at home until daughter returns from her trip and consistently bring Clayton [name] to therapy. Trained pt and daughter in swallow precautions with usual mod cues. Generated large print handout of precautions to be used at meal time for family to cue ptl           SLP Education - 05/11/15 1402    Education provided Yes   Education Details rationale for dysphagia exercises, dysphagia HEP,  swallow precautions    Person(s) Educated Patient;Child(ren)   Methods Explanation;Demonstration;Tactile cues;Verbal cues;Handout   Comprehension Verbalized understanding;Returned demonstration;Verbal cues required;Need further instruction;Tactile cues required          SLP Short Term Goals - 05/11/15 1412    SLP SHORT TERM GOAL #1   Title Pt will perform dysphagia HEP with occasional min A   Time 4   Period Weeks   Status New   SLP SHORT TERM GOAL #2   Title Pt will follow swallow precautions with occasional min A   Time 4   Period Weeks   Status New   SLP SHORT TERM GOAL #3   Title Pt /caregiver will verbalize 4 s/s of aspiration pna with rare min A   Time 4   Period Weeks   Status New          SLP Long Term Goals - 05/11/15 1413    SLP LONG TERM GOAL #1   Title Pt will perfrom dysphagia HEP with rare min A   Time 8   Period Weeks   Status New   SLP LONG TERM GOAL #2   Title Pt will follow swallow precautions with rare min A over 2 sessions   Time 8   Period Weeks   Status New          Plan - 05/11/15 1405    Clinical Impression Statement 82 y.o. male who was hospitalized with aspiration pna 03/01/15. MBSS 05/03/15 revealed mild to moderate oropharyngeal dysphagia with consistent vallecular residue and weak/delayed oral transit, with moderate aspiration risk. MBSS recommended hard swallow, dry swallow after each solid,  alternate solids and liquids, take meds with puree. Today, Clayton [name] is accompanied by his daughter Clayton Cones. He requried usual mod cues for small sips, hard swallows and dry swallows after PO trials. His daughter reports that Mr. Leather does "shovel food in his mouth when he still has a bite in his mouth".  I recommend skilled ST to maximize safey of swallow through  compensations for dysphagia and training in dysphagia HEP. Daughter  Clayton Cones is Mr. Urwin' transportation and is going out of town 05/18/15 to 05/29/15. We agreed to initiate therapy upon her return.    Speech Therapy Frequency 2x / week   Duration --  8 weeks starting after 05/29/15, when daughter is back in town and can bring her father to ST   Treatment/Interventions Aspiration precaution training;Pharyngeal strengthening exercises;Diet toleration management by SLP;Compensatory techniques;Environmental controls;Internal/external aids;SLP instruction and feedback;Patient/family education   Potential to Achieve Goals Fair   Potential Considerations Cooperation/participation level   SLP Home Exercise Plan see pt instructions    Consulted and Agree with Plan of Care Patient;Family member/caregiver   Family Member Consulted daughter, Bobbye Riggs          G-Codes - 06/07/2015 1414    Functional Assessment Tool Used NOMS   Functional Limitations Swallowing   Swallow Current Status [phone]) At least 40 percent but less than 60 percent impaired, limited or restricted   Swallow Goal Status [phone]) At least 20 percent but less than 40 percent impaired, limited or restricted      Problem List Patient Active Problem List   Diagnosis Date Noted  . Acute renal failure superimposed on stage 3 chronic kidney disease (HCC) 02/07/2015  . Syncope 02/07/2015  . Normocytic anemia 02/07/2015  . Hyperkalemia 02/07/2015  . Diabetes mellitus without complication (HCC)   . paroxysmal afib   . Absolute anemia   . Faintness   . Absent pedal pulses  12/28/2014  . Essential hypertension, benign 06/27/2012  . Proximal humerus fracture, right 06/24/2012    Kordelia Severin, Radene Journey MS, CCC-SLP 2015-06-07, 2:22 PM  [facility] [address] [place], [place], [address] Phone: [phone]   Fax:  [phone]  Name: Clayton [name] MRN: [id number] Date of Birth: 02/12/1920

## 2015-05-11 NOTE — Patient Instructions (Addendum)
SWALLOWING EXERCISES 1. Effortful Swallows - Squeeze hard with the muscles in your neck while you swallow your  saliva or a sip of water - Repeat 20 times, 2-3 times a day, and use whenever you eat or drink  2. Masako Swallow - swallow with your tongue sticking out - Stick tongue out and gently bite tongue with your teeth - Swallow, while holding your tongue with your teeth - Repeat 20 times, 2-3 times a day  3. Pitch Raise - Repeat "he", once per second in as high of a pitch as you can - Repeat 20 times, 2-3 times a day  4. Shaker Exercise - head lift - Lie flat on your back in your bed or on a couch without pillows - Raise your head and look at your feet  - KEEP YOUR SHOULDERS DOWN - HOLD FOR 45 SECONDS, then lower your head back down - Repeat 3 times, 2-3 times a day  5. Mendelsohn Maneuver - "half swallow" exercise - Start to swallow, and keep your Adam's apple up by squeezing hard with the muscles of the throat - Hold the squeeze for 5-7 seconds and then relax - Repeat 20 times, 2-3 times a day  6. Tongue Press - Press your entire tongue as hard as you can against the roof of your mouth for 3-5 seconds - Repeat 20 times, 2-3 times a day  7. Tongue Stretch/Teeth Clean - Move your tongue around the pocket between your gums and teeth, clockwise and then counter-clockwise - Repeat on the back side, clockwise and then counter-clockwise - Repeat 15-20 times, 2-3 times a day  8. Breath Hold - Say "HUH!" loudly, holding your breath tightly at the level of your voice box for 3 seconds - Repeat 20 times, 2-3 times a day  9. Chin pushback - Open your mouth  - Place your fist UNDER your chin near your neck, and push back with your fist for 5 seconds  - Repeat 20 times, 2-3 times a day     10. Open mouth swallow          -Swallow with your mouth open wide          -Repeat 15 times, 2 times a day  11. Stick out your tongue and say "GA-GA-GA" loud and shart           -Repeat  25 sets of 3, 2-3 times a day  12. Say ING-GA loud and exaggerated             - 25 times 2-3 times a day  13. Gargle or pretend to gargle             - 10 times for 3-5 seconds (or as long as you can) 2 times a day                     Small bites and small single sips  Swallow what is in your mouth before you take another bite  Effortful/hard swallow with solids  Dry swallow after each bite  Alternate solids with liquids

## 2015-05-11 NOTE — Telephone Encounter (Signed)
This encounter was created in error - please disregard.

## 2015-05-12 NOTE — Progress Notes (Signed)
Quick Note:  I spoke to daughter Jacki ConesLaurie He is intolerant of oral iron Please arrange feraheme injection x 2 re: iron deficiency anemia  Will need to contact her [name](Laurie) FYI she will be gone 1/12-1/23 ______

## 2015-05-16 ENCOUNTER — Other Ambulatory Visit: Payer: Self-pay

## 2015-05-16 DIAGNOSIS — D509 Iron deficiency anemia, unspecified: Secondary | ICD-10-CM

## 2015-05-18 ENCOUNTER — Ambulatory Visit: Payer: Medicare Other | Admitting: Speech Pathology

## 2015-05-22 ENCOUNTER — Inpatient Hospital Stay (HOSPITAL_COMMUNITY)
Admission: EM | Admit: 2015-05-22 | Discharge: 2015-05-23 | DRG: 683 | Disposition: A | Discharge status: Home / Self Care | Payer: Medicare (Managed Care) | Attending: Internal Medicine | Admitting: Internal Medicine

## 2015-05-22 ENCOUNTER — Encounter (HOSPITAL_COMMUNITY): Payer: Self-pay | Admitting: Emergency Medicine

## 2015-05-22 ENCOUNTER — Emergency Department (HOSPITAL_COMMUNITY): Payer: Medicare (Managed Care)

## 2015-05-22 DIAGNOSIS — Z66 Do not resuscitate: Secondary | ICD-10-CM | POA: Diagnosis present

## 2015-05-22 DIAGNOSIS — E875 Hyperkalemia: Secondary | ICD-10-CM | POA: Diagnosis present

## 2015-05-22 DIAGNOSIS — R339 Retention of urine, unspecified: Secondary | ICD-10-CM | POA: Diagnosis present

## 2015-05-22 DIAGNOSIS — R0789 Other chest pain: Secondary | ICD-10-CM | POA: Diagnosis present

## 2015-05-22 DIAGNOSIS — S22068A Other fracture of T7-T8 thoracic vertebra, initial encounter for closed fracture: Secondary | ICD-10-CM | POA: Diagnosis present

## 2015-05-22 DIAGNOSIS — Z87891 Personal history of nicotine dependence: Secondary | ICD-10-CM

## 2015-05-22 DIAGNOSIS — R079 Chest pain, unspecified: Secondary | ICD-10-CM | POA: Diagnosis present

## 2015-05-22 DIAGNOSIS — K449 Diaphragmatic hernia without obstruction or gangrene: Secondary | ICD-10-CM | POA: Diagnosis present

## 2015-05-22 DIAGNOSIS — W010XXA Fall on same level from slipping, tripping and stumbling without subsequent striking against object, initial encounter: Secondary | ICD-10-CM | POA: Diagnosis present

## 2015-05-22 DIAGNOSIS — H3530 Unspecified macular degeneration: Secondary | ICD-10-CM | POA: Diagnosis present

## 2015-05-22 DIAGNOSIS — I10 Essential (primary) hypertension: Secondary | ICD-10-CM

## 2015-05-22 DIAGNOSIS — N183 Chronic kidney disease, stage 3 unspecified: Secondary | ICD-10-CM | POA: Diagnosis present

## 2015-05-22 DIAGNOSIS — I129 Hypertensive chronic kidney disease with stage 1 through stage 4 chronic kidney disease, or unspecified chronic kidney disease: Secondary | ICD-10-CM | POA: Diagnosis present

## 2015-05-22 DIAGNOSIS — Z87442 Personal history of urinary calculi: Secondary | ICD-10-CM

## 2015-05-22 DIAGNOSIS — E119 Type 2 diabetes mellitus without complications: Secondary | ICD-10-CM

## 2015-05-22 DIAGNOSIS — E860 Dehydration: Secondary | ICD-10-CM | POA: Diagnosis present

## 2015-05-22 DIAGNOSIS — L03221 Cellulitis of neck: Secondary | ICD-10-CM | POA: Diagnosis present

## 2015-05-22 DIAGNOSIS — N179 Acute kidney failure, unspecified: Secondary | ICD-10-CM | POA: Diagnosis present

## 2015-05-22 LAB — CBC
HEMATOCRIT: 30.7 % — AB (ref 39.0–52.0)
HEMOGLOBIN: 9.5 g/dL — AB (ref 13.0–17.0)
MCH: 29.4 pg (ref 26.0–34.0)
MCHC: 30.9 g/dL (ref 30.0–36.0)
MCV: 95 fL (ref 78.0–100.0)
Platelets: 239 10*3/uL (ref 150–400)
RBC: 3.23 MIL/uL — AB (ref 4.22–5.81)
RDW: 15.6 % — ABNORMAL HIGH (ref 11.5–15.5)
WBC: 10.1 10*3/uL (ref 4.0–10.5)

## 2015-05-22 LAB — TROPONIN I: TROPONIN I: 0.03 ng/mL (ref <0.031)

## 2015-05-22 LAB — BASIC METABOLIC PANEL
ANION GAP: 11 (ref 5–15)
ANION GAP: 8 (ref 5–15)
BUN: 33 mg/dL — AB (ref 6–20)
BUN: 31 mg/dL — ABNORMAL HIGH (ref 6–20)
CALCIUM: 9.2 mg/dL (ref 8.9–10.3)
CALCIUM: 9.3 mg/dL (ref 8.9–10.3)
CO2: 24 mmol/L (ref 22–32)
CO2: 26 mmol/L (ref 22–32)
Chloride: 105 mmol/L (ref 101–111)
Chloride: 109 mmol/L (ref 101–111)
Creatinine, Ser: 1.74 mg/dL — ABNORMAL HIGH (ref 0.61–1.24)
Creatinine, Ser: 1.68 mg/dL — ABNORMAL HIGH (ref 0.61–1.24)
GFR calc Af Amer: 37 mL/min — ABNORMAL LOW (ref >60)
GFR calc non Af Amer: 32 mL/min — ABNORMAL LOW (ref >60)
GFR, EST AFRICAN AMERICAN: 39 mL/min — AB (ref >60)
GFR, EST NON AFRICAN AMERICAN: 33 mL/min — AB (ref >60)
GLUCOSE: 188 mg/dL — AB (ref 65–99)
Glucose, Bld: 148 mg/dL — ABNORMAL HIGH (ref 65–99)
POTASSIUM: 6 mmol/L — AB (ref 3.5–5.1)
Potassium: 5.4 mmol/L — ABNORMAL HIGH (ref 3.5–5.1)
SODIUM: 140 mmol/L (ref 135–145)
Sodium: 143 mmol/L (ref 135–145)

## 2015-05-22 LAB — URINE MICROSCOPIC-ADD ON

## 2015-05-22 LAB — I-STAT TROPONIN, ED: TROPONIN I, POC: 0.02 ng/mL (ref 0.00–0.08)

## 2015-05-22 LAB — GLUCOSE, CAPILLARY: GLUCOSE-CAPILLARY: 116 mg/dL — AB (ref 65–99)

## 2015-05-22 LAB — URINALYSIS, ROUTINE W REFLEX MICROSCOPIC
Bilirubin Urine: NEGATIVE
Glucose, UA: NEGATIVE mg/dL
KETONES UR: NEGATIVE mg/dL
NITRITE: NEGATIVE
PH: 6 (ref 5.0–8.0)
Protein, ur: 30 mg/dL — AB
Specific Gravity, Urine: 1.017 (ref 1.005–1.030)

## 2015-05-22 MED ORDER — HEPARIN SODIUM (PORCINE) 5000 UNIT/ML IJ SOLN
5000.0000 [IU] | Freq: Three times a day (TID) | INTRAMUSCULAR | Status: DC
  Administered 2015-05-22 – 2015-05-23 (×2): 5000 [IU] via SUBCUTANEOUS
  Filled 2015-05-22 (×3): qty 1

## 2015-05-22 MED ORDER — SODIUM CHLORIDE 0.9 % IV SOLN
INTRAVENOUS | Status: DC
  Administered 2015-05-22: 20:00:00 via INTRAVENOUS

## 2015-05-22 MED ORDER — SODIUM POLYSTYRENE SULFONATE 15 GM/60ML PO SUSP
30.0000 g | Freq: Once | ORAL | Status: AC
  Administered 2015-05-22: 30 g via ORAL
  Filled 2015-05-22: qty 120

## 2015-05-22 MED ORDER — ONDANSETRON HCL 4 MG/2ML IJ SOLN: 4.0000 mg | Freq: Four times a day (QID) | INTRAMUSCULAR | Status: DC | PRN

## 2015-05-22 MED ORDER — OXYCODONE HCL 5 MG PO TABS
5.0000 mg | ORAL_TABLET | ORAL | Status: DC | PRN
  Administered 2015-05-22 – 2015-05-23 (×2): 5 mg via ORAL
  Filled 2015-05-22 (×3): qty 1

## 2015-05-22 MED ORDER — ACETAMINOPHEN 325 MG PO TABS: 650.0000 mg | ORAL_TABLET | Freq: Four times a day (QID) | ORAL | Status: DC | PRN

## 2015-05-22 MED ORDER — SODIUM CHLORIDE 0.9 % IV SOLN
INTRAVENOUS | Status: DC
  Administered 2015-05-22: 13:00:00 via INTRAVENOUS

## 2015-05-22 MED ORDER — DOCUSATE SODIUM 100 MG PO CAPS
100.0000 mg | ORAL_CAPSULE | Freq: Two times a day (BID) | ORAL | Status: DC
Start: 2015-05-22 — End: 2015-05-23
  Administered 2015-05-22 – 2015-05-23 (×2): 100 mg via ORAL
  Filled 2015-05-22 (×2): qty 1

## 2015-05-22 MED ORDER — TRAMADOL HCL 50 MG PO TABS
50.0000 mg | ORAL_TABLET | Freq: Three times a day (TID) | ORAL | Status: DC | PRN
  Administered 2015-05-22: 50 mg via ORAL
  Filled 2015-05-22: qty 1

## 2015-05-22 MED ORDER — INSULIN ASPART 100 UNIT/ML ~~LOC~~ SOLN
0.0000 [IU] | Freq: Three times a day (TID) | SUBCUTANEOUS | Status: DC
  Administered 2015-05-23: 1 [IU] via SUBCUTANEOUS

## 2015-05-22 MED ORDER — CEPHALEXIN 500 MG PO CAPS
500.0000 mg | ORAL_CAPSULE | Freq: Three times a day (TID) | ORAL | Status: DC
  Administered 2015-05-22 – 2015-05-23 (×2): 500 mg via ORAL
  Filled 2015-05-22 (×3): qty 1

## 2015-05-22 MED ORDER — ACETAMINOPHEN 650 MG RE SUPP: 650.0000 mg | Freq: Four times a day (QID) | RECTAL | Status: DC | PRN

## 2015-05-22 MED ORDER — ALBUTEROL SULFATE (2.5 MG/3ML) 0.083% IN NEBU: 2.5000 mg | INHALATION_SOLUTION | RESPIRATORY_TRACT | Status: DC | PRN

## 2015-05-22 MED ORDER — ONDANSETRON HCL 4 MG PO TABS: 4.0000 mg | ORAL_TABLET | Freq: Four times a day (QID) | ORAL | Status: DC | PRN

## 2015-05-22 MED ORDER — SODIUM CHLORIDE 0.9 % IJ SOLN
3.0000 mL | Freq: Two times a day (BID) | INTRAMUSCULAR | Status: DC
Start: 2015-05-22 — End: 2015-05-23
  Administered 2015-05-22 – 2015-05-23 (×2): 3 mL via INTRAVENOUS

## 2015-05-22 NOTE — ED Notes (Signed)
Nurse starting iv will collect labs 

## 2015-05-22 NOTE — ED Provider Notes (Signed)
CSN: [id number]     Arrival date & time 05/22/15  4098 History   First MD Initiated Contact with Patient 05/22/15 1217     Chief Complaint  Patient presents with  . Shoulder Pain     (Consider location/radiation/quality/duration/timing/severity/associated sxs/prior Treatment) HPI Comments: Patient here complaining of right sternal pain after a fall on Friday that was mechanical. Has had some weakness since then but denies any recent fever, vomiting, diarrhea. Denies any anginal type symptoms. No shortness of breath. Pain characterized as sharp and worse with movement better with rest. Denies any rashes. Has been using tramadol without relief. He denies any abdominal hip or lower extremity pain. No severe back discomfort but does have a history of compression fractures in the past.  Patient is a 82 y.o. male presenting with shoulder pain. The history is provided by the patient and the spouse.  Shoulder Pain   Past Medical History  Diagnosis Date  . Macular degeneration   . Hypertension   . Diabetes mellitus without complication (HCC)   . paroxysmal afib   . CKD (chronic kidney disease), stage III   . Kidney stones   . Distended bladder   . Hiatal hernia   . Diverticulosis     on scan  . Syncopal episodes    Past Surgical History  Procedure Laterality Date  . Orif humerus fracture Right 06/26/2012    Procedure: OPEN REDUCTION INTERNAL FIXATION (ORIF) PROXIMAL HUMERUS FRACTURE, right ;  Surgeon: Budd Palmer, MD;  Location: [facility] OR;  Service: Orthopedics;  Laterality: Right;  . Kidney stone surgery      since young age   Family History  Problem Relation Age of Onset  . Aneurysm Father     Aortic aneurysm  . Macular degeneration Mother   . Gallbladder disease Mother   . Pancreatitis Sister   . Macular degeneration Sister     x 4   Social History  Substance Use Topics  . Smoking status: Former Smoker    Types: Cigars    Quit date: 05/06/1969  . Smokeless tobacco: Never  Used  . Alcohol Use: No    Review of Systems  All other systems reviewed and are negative.     Allergies  Sulfa antibiotics  Home Medications   Prior to Admission medications   Medication Sig Start Date End Date Taking? Authorizing Provider  cephALEXin (KEFLEX) 500 MG capsule Take 500 mg by mouth 3 (three) times daily. Started 01/10 for 13 days   Yes Historical Provider, MD  lisinopril (PRINIVIL,ZESTRIL) 20 MG tablet Take 20 mg by mouth daily.   Yes Historical Provider, MD  metFORMIN (GLUCOPHAGE-XR) 500 MG 24 hr tablet Take 500 mg by mouth daily with breakfast.   Yes Historical Provider, MD  tamsulosin (FLOMAX) 0.4 MG CAPS capsule Take 0.4 mg by mouth daily as needed (or urine output).    Yes Historical Provider, MD  traMADol (ULTRAM) 50 MG tablet Take 50 mg by mouth 3 (three) times daily as needed (for pain).   Yes Historical Provider, MD   BP 123/62 mmHg  Pulse 73  Temp(Src) 97.8 F (36.6 C) (Oral)  Resp 13  SpO2 97% Physical Exam  Constitutional: He is oriented to person, place, and time. He appears well-developed and well-nourished.  Non-toxic appearance. No distress.  HENT:  Head: Normocephalic and atraumatic.  Eyes: Conjunctivae, EOM and lids are normal. Pupils are equal, round, and reactive to light.  Neck: Normal range of motion. Neck supple. No tracheal deviation  present. No thyroid mass present.  Cardiovascular: Normal rate, regular rhythm and normal heart sounds.  Exam reveals no gallop.   No murmur heard. Pulmonary/Chest: Effort normal and breath sounds normal. No stridor. No respiratory distress. He has no decreased breath sounds. He has no wheezes. He has no rhonchi. He has no rales.  Abdominal: Soft. Normal appearance and bowel sounds are normal. He exhibits no distension. There is no tenderness. There is no rebound and no CVA tenderness.  Musculoskeletal: Normal range of motion. He exhibits no edema or tenderness.       Arms: Neurological: He is alert and  oriented to person, place, and time. He has normal strength. No cranial nerve deficit or sensory deficit. GCS eye subscore is 4. GCS verbal subscore is 5. GCS motor subscore is 6.  Skin: Skin is warm and dry. No abrasion and no rash noted.  Psychiatric: He has a normal mood and affect. His speech is normal and behavior is normal.  Nursing note and vitals reviewed.   ED Course  Procedures (including critical care time) Labs Review Labs Reviewed  BASIC METABOLIC PANEL - Abnormal; Notable for the following:    Potassium 6.0 (*)    Glucose, Bld 188 (*)    BUN 33 (*)    Creatinine, Ser 1.74 (*)    GFR calc non Af Amer 32 (*)    GFR calc Af Amer 37 (*)    All other components within normal limits  CBC - Abnormal; Notable for the following:    RBC 3.23 (*)    Hemoglobin 9.5 (*)    HCT 30.7 (*)    RDW 15.6 (*)    All other components within normal limits  URINE CULTURE  URINALYSIS, ROUTINE W REFLEX MICROSCOPIC (NOT AT [facility])  [facility] SensorI-STAT TROPOININ, ED    Imaging Review Dg Chest 2 View  05/22/2015  CLINICAL DATA:  Right chest pain after recent fall. EXAM: CHEST  2 VIEW COMPARISON:  03/06/2015 chest radiograph. FINDINGS: A single screw overlies the right proximal humerus. Stable cardiomediastinal silhouette with normal heart size and moderate to large hiatal hernia. No pneumothorax. No pleural effusion. Mild scarring versus atelectasis at the left lung base. No pulmonary edema. No acute consolidative airspace disease. There is a severe T8 vertebral compression fracture with approximately 70% loss of vertebral body height, which appears worsened compared to the 03/07/2015 CT study where there was approximately 20% loss of vertebral body height. There is stable chronic T12 and L1 vertebral compression fractures status post vertebroplasty. IMPRESSION: 1. Severe T8 vertebral compression fracture, with interval worsening since 03/07/2015. 2. No active cardiopulmonary disease. 3. Stable moderate to large  hiatal hernia. Electronically Signed   By: Delbert PhenixJason A Poff M.D.   On: 05/22/2015 10:47   I have personally reviewed and evaluated these images and lab results as part of my medical decision-making.   EKG Interpretation   Date/Time:  Monday May 22 2015 11:39:58 EST Ventricular Rate:  70 PR Interval:  187 QRS Duration: 132 QT Interval:  398 QTC Calculation: 429 R Axis:   100 Text Interpretation:  Sinus rhythm RBBB and LPFB No significant change  since last tracing Confirmed by Jayleana Colberg  MD, Lester Crickenberger ([id number]) on 05/22/2015  3:05:29 PM      MDM   Final diagnoses:  None    Patient given Kayexalate for hyperkalemia as well as IV fluids for possible dehydration. Will be admitted to the medicine service    Lorre NickAnthony Traeger Sultana, MD 05/22/15 [phone]

## 2015-05-22 NOTE — ED Notes (Signed)
Daughter in law made aware of room assignment.

## 2015-05-22 NOTE — ED Notes (Signed)
Pt can go up at 18:25. 

## 2015-05-22 NOTE — Progress Notes (Signed)
Received pt from ED, telemetry applied, oriented to unit, call light placed in reach

## 2015-05-22 NOTE — ED Notes (Signed)
Patient given a warm pack for back/neck to ease pain.

## 2015-05-22 NOTE — ED Notes (Signed)
Violet BaldyVicki Garland (daughter in law) [phone] Call for updates

## 2015-05-22 NOTE — ED Notes (Addendum)
Pt reports fall Friday night; reports worsening right shoulder/clavicle pain since event. Pt denies chest/heart pain, but unsure to r/o based off of pt descriptors upon triage.

## 2015-05-22 NOTE — H&P (Signed)
[facility] History and Physical  KLAYTON MONIE IOE:[id number] DOB: Jun 10, 1919 DOA: 05/22/2015   PCP: Dwan Bolt, MD  Specialists: None  Chief Complaint: Pain in the right chest and shoulder area  HPI: Clayton [name] is a 82 y.o. male with a past medical history of hypertension and diabetes who was in his usual state of health until this past Friday when he was walking in his house when he stumbled and fell and hit his upper body against a door. And since then he has had pain in his right upper chest area. He has been taking tramadol without any significant relief. He is somewhat of a poor historian. He is unable to tell if he has any upper back pain or not. He denies any shortness of breath. No nausea, vomiting or chills. He has a chronic indwelling Foley for urinary retention. He was seen by his urologist this morning and had his Foley catheter changed. Unable to quantify the pain. History is very limited. He is accompanied by his daughter and wife.  Home Medications: Prior to Admission medications   Medication Sig Start Date End Date Taking? Authorizing Provider  cephALEXin (KEFLEX) 500 MG capsule Take 500 mg by mouth 3 (three) times daily. Started 01/10 for 13 days   Yes Historical Provider, MD  lisinopril (PRINIVIL,ZESTRIL) 20 MG tablet Take 20 mg by mouth daily.   Yes Historical Provider, MD  metFORMIN (GLUCOPHAGE-XR) 500 MG 24 hr tablet Take 500 mg by mouth daily with breakfast.   Yes Historical Provider, MD  tamsulosin (FLOMAX) 0.4 MG CAPS capsule Take 0.4 mg by mouth daily as needed (or urine output).    Yes Historical Provider, MD  traMADol (ULTRAM) 50 MG tablet Take 50 mg by mouth 3 (three) times daily as needed (for pain).   Yes Historical Provider, MD    Allergies:  Allergies  Allergen Reactions  . Sulfa Antibiotics Itching    Past Medical History: Past Medical History  Diagnosis Date  . Macular degeneration   . Hypertension   . Diabetes mellitus  without complication ([place])   . paroxysmal afib   . CKD (chronic kidney disease), stage III   . Kidney stones   . Distended bladder   . Hiatal hernia   . Diverticulosis     on scan  . Syncopal episodes     Past Surgical History  Procedure Laterality Date  . Orif humerus fracture Right 06/26/2012    Procedure: OPEN REDUCTION INTERNAL FIXATION (ORIF) PROXIMAL HUMERUS FRACTURE, right ;  Surgeon: Rozanna Box, MD;  Location: [facility];  Service: Orthopedics;  Laterality: Right;  . Kidney stone surgery      since young age    Social History: Lives with his wife. Uses a walker to ambulate.   Social History   Social History  . Marital Status: Married    Spouse Name: N/A  . Number of Children: 3  . Years of Education: N/A   Occupational History  . retired    Social History Main Topics  . Smoking status: Former Smoker    Types: Cigars    Quit date: 05/06/1969  . Smokeless tobacco: Never Used  . Alcohol Use: No  . Drug Use: No  . Sexual Activity: Not on file   Other Topics Concern  . Not on file   Social History Narrative    Family History:  Family History  Problem Relation Age of Onset  . Aneurysm Father     Aortic aneurysm  .  Macular degeneration Mother   . Gallbladder disease Mother   . Pancreatitis Sister   . Macular degeneration Sister     x 4     Review of Systems - unable to do likely due to cognitive impairment  Physical Examination  Filed Vitals:   05/22/15 1300 05/22/15 1500 05/22/15 1515 05/22/15 1557  BP: 160/68 136/50  155/69  Pulse: 72  76 76  Temp:    97.8 F (36.6 C)  TempSrc:    Oral  Resp: '15 16 19 18  '$ SpO2: 97%  96% 96%    BP 155/69 mmHg  Pulse 76  Temp(Src) 97.8 F (36.6 C) (Oral)  Resp 18  SpO2 96%  General appearance: alert, cooperative, distracted and no distress Head: Normocephalic, without obvious abnormality, atraumatic Eyes: conjunctivae/corneas clear. PERRL, EOM's intact.  Throat: lips, mucosa, and tongue normal; teeth  and gums normal Neck: no adenopathy, no carotid bruit, no JVD, supple, symmetrical, trachea midline, thyroid not enlarged, symmetric, no tenderness/mass/nodules and mild erythema over anterior neck. improved per family. Resp: Coarse breath sounds bilaterally. Clear to auscultation otherwise. Chest wall: Tender over the right chest and it reproduces his pain. Palpation over his spinal processes did not elicit clear-cut tenderness. Cardio: regular rate and rhythm, S1, S2 normal, systolic murmur over the precordium. No click, rub or gallop GI: soft, non-tender; bowel sounds normal; no masses,  no organomegaly Extremities: extremities normal, atraumatic, no cyanosis or edema Pulses: 2+ and symmetric Skin: Skin color, texture, turgor normal. No rashes or lesions Lymph nodes: Cervical, supraclavicular, and axillary nodes normal. Neurologic: Is awake and alert. No facial asymmetry. Moving all his extremities. No focal deficits are noted.  Laboratory Data: Results for orders placed or performed during the hospital encounter of 05/22/15 (from the past 48 hour(s))  Basic metabolic panel     Status: Abnormal   Collection Time: 05/22/15 11:57 AM  Result Value Ref Range   Sodium 140 135 - 145 mmol/L   Potassium 6.0 (H) 3.5 - 5.1 mmol/L    Comment: NO VISIBLE HEMOLYSIS   Chloride 105 101 - 111 mmol/L   CO2 24 22 - 32 mmol/L   Glucose, Bld 188 (H) 65 - 99 mg/dL   BUN 33 (H) 6 - 20 mg/dL   Creatinine, Ser 1.74 (H) 0.61 - 1.24 mg/dL   Calcium 9.2 8.9 - 10.3 mg/dL   GFR calc non Af Amer 32 (L) >60 mL/min   GFR calc Af Amer 37 (L) >60 mL/min    Comment: (NOTE) The eGFR has been calculated using the CKD EPI equation. This calculation has not been validated in all clinical situations. eGFR's persistently <60 mL/min signify possible Chronic Kidney Disease.    Anion gap 11 5 - 15  CBC     Status: Abnormal   Collection Time: 05/22/15 11:57 AM  Result Value Ref Range   WBC 10.1 4.0 - 10.5 K/uL   RBC  3.23 (L) 4.22 - 5.81 MIL/uL   Hemoglobin 9.5 (L) 13.0 - 17.0 g/dL   HCT 30.7 (L) 39.0 - 52.0 %   MCV 95.0 78.0 - 100.0 fL   MCH 29.4 26.0 - 34.0 pg   MCHC 30.9 30.0 - 36.0 g/dL   RDW 15.6 (H) 11.5 - 15.5 %   Platelets 239 150 - 400 K/uL  I-stat troponin, ED (not at [facility])     Status: None   Collection Time: 05/22/15 11:59 AM  Result Value Ref Range   Troponin i, poc 0.02 0.00 -  0.08 ng/mL   Comment 3            Comment: Due to the release kinetics of cTnI, a negative result within the first hours of the onset of symptoms does not rule out myocardial infarction with certainty. If myocardial infarction is still suspected, repeat the test at appropriate intervals.   Urinalysis, Routine w reflex microscopic (not at [facility])     Status: Abnormal   Collection Time: 05/22/15  1:09 PM  Result Value Ref Range   Color, Urine YELLOW YELLOW   APPearance CLOUDY (A) CLEAR   Specific Gravity, Urine 1.017 1.005 - 1.030   pH 6.0 5.0 - 8.0   Glucose, UA NEGATIVE NEGATIVE mg/dL   Hgb urine dipstick MODERATE (A) NEGATIVE   Bilirubin Urine NEGATIVE NEGATIVE   Ketones, ur NEGATIVE NEGATIVE mg/dL   Protein, ur 30 (A) NEGATIVE mg/dL   Nitrite NEGATIVE NEGATIVE   Leukocytes, UA SMALL (A) NEGATIVE  Urine microscopic-add on     Status: Abnormal   Collection Time: 05/22/15  1:09 PM  Result Value Ref Range   Squamous Epithelial / LPF 0-5 (A) NONE SEEN   WBC, UA 0-5 0 - 5 WBC/hpf   RBC / HPF 6-30 0 - 5 RBC/hpf   Bacteria, UA RARE (A) NONE SEEN    Radiology Reports: Dg Chest 2 View  05/22/2015  CLINICAL DATA:  Right chest pain after recent fall. EXAM: CHEST  2 VIEW COMPARISON:  03/06/2015 chest radiograph. FINDINGS: A single screw overlies the right proximal humerus. Stable cardiomediastinal silhouette with normal heart size and moderate to large hiatal hernia. No pneumothorax. No pleural effusion. Mild scarring versus atelectasis at the left lung base. No pulmonary edema. No acute consolidative  airspace disease. There is a severe T8 vertebral compression fracture with approximately 70% loss of vertebral body height, which appears worsened compared to the 03/07/2015 CT study where there was approximately 20% loss of vertebral body height. There is stable chronic T12 and L1 vertebral compression fractures status post vertebroplasty. IMPRESSION: 1. Severe T8 vertebral compression fracture, with interval worsening since 03/07/2015. 2. No active cardiopulmonary disease. 3. Stable moderate to large hiatal hernia. Electronically Signed   By: Ilona Sorrel M.D.   On: 05/22/2015 10:47    My interpretation of Electrocardiogram: 2 EKGs are available. Initial one done at 10:17 AM reveals diffuse T-wave inversions in inferior leads as well as anterior leads. RBBB is noted. Sinus rhythm. Subsequent EKG done at 11:39 AM she does not show these T wave abnormalities. And actually looks similar to his previous EKGs.  Problem List  Principal Problem:   Hyperkalemia Active Problems:   Essential hypertension, benign   Diabetes mellitus without complication (HCC)   Acute renal failure superimposed on stage 3 chronic kidney disease (HCC)   AKI (acute kidney injury) ([place])   Assessment: This is a 82 year old Caucasian male with a past medical history of hypertension, diabetes, presents after a fall sustained on Friday and now with right upper chest pain. Evaluation in the ED reveals mild acute renal failure with hyperkalemia. EKG changes are also noted. Troponin is normal. X-ray also shows severe T8 vertebral compression fracture.  Plan: #1 Mild acute renal failure with hyperkalemia: He has been given Kayexalate. He is being given IV fluids. He appears to be mildly dehydrated. He has had poor oral intake in the last few days per his family. Repeat basic metabolic panel this evening. Continue IV fluids for now.  #2 Right upper chest pain: His chest  wall is tender to palpation and it reproduces his pain. This is  most likely musculoskeletal. Initial abnormal EKG was most likely due to his electrolyte abnormalities. Initial troponin is normal. Troponin will be repeated. Chest x-ray does not show any rib fractures. Patient and family reassured. Echocardiogram report from October 2016 was reviewed. He had normal EF. No wall motion abnormalities noted.  #3 Compression fracture T8: Patient has had multiple vertebral fractures in the past. He has undergone vertebroplasty in the past. However, I am unable to determine if this compression fracture is causing patient's any symptoms. He is not able to tell me if he has upper back pain or not. Proceed with PT and OT evaluation for now. If he exhibits significant discomfort, then we may need to consider evaluation by IR. However, I would hesitate subjecting this elderly patient to vertebroplasty, unless it is absolutely indicated. Pain control for now.  #4 history of essential hypertension: Monitor blood pressures closely. Hold his lisinopril due to hyperkalemia and renal failure.  #5 history of diabetes mellitus type 2: Sliding scale insulin coverage.  He is on Keflex for cellulitis involving his neck. Continue for now.   DVT Prophylaxis: Subcutaneous heparin Code Status: CODE STATUS was discussed with the patient's and his family. He is DO NOT RESUSCITATE Family Communication: Discussed with the patient's wife as well as daughter  Disposition Plan: To telemetry   Further management decisions will depend on results of further testing and patient's response to treatment.   [facility]  [facility] Pager [phone]  If 7PM-7AM, please contact night-coverage [email or web address] Password [email or web address]  05/22/2015, 5:20 PM

## 2015-05-23 LAB — BASIC METABOLIC PANEL
ANION GAP: 10 (ref 5–15)
BUN: 27 mg/dL — ABNORMAL HIGH (ref 6–20)
CALCIUM: 8.5 mg/dL — AB (ref 8.9–10.3)
CHLORIDE: 107 mmol/L (ref 101–111)
CO2: 23 mmol/L (ref 22–32)
CREATININE: 1.5 mg/dL — AB (ref 0.61–1.24)
GFR calc non Af Amer: 38 mL/min — ABNORMAL LOW (ref >60)
GFR, EST AFRICAN AMERICAN: 44 mL/min — AB (ref >60)
Glucose, Bld: 123 mg/dL — ABNORMAL HIGH (ref 65–99)
Potassium: 4.8 mmol/L (ref 3.5–5.1)
SODIUM: 140 mmol/L (ref 135–145)

## 2015-05-23 LAB — URINE CULTURE: Culture: NO GROWTH

## 2015-05-23 LAB — CBC
HCT: 25.7 % — ABNORMAL LOW (ref 39.0–52.0)
HEMOGLOBIN: 8.3 g/dL — AB (ref 13.0–17.0)
MCH: 30.4 pg (ref 26.0–34.0)
MCHC: 32.3 g/dL (ref 30.0–36.0)
MCV: 94.1 fL (ref 78.0–100.0)
PLATELETS: 201 10*3/uL (ref 150–400)
RBC: 2.73 MIL/uL — AB (ref 4.22–5.81)
RDW: 15.6 % — ABNORMAL HIGH (ref 11.5–15.5)
WBC: 9.5 10*3/uL (ref 4.0–10.5)

## 2015-05-23 LAB — TROPONIN I: Troponin I: 0.04 ng/mL — ABNORMAL HIGH (ref <0.031)

## 2015-05-23 LAB — GLUCOSE, CAPILLARY
GLUCOSE-CAPILLARY: 86 mg/dL (ref 65–99)
GLUCOSE-CAPILLARY: 126 mg/dL — AB (ref 65–99)

## 2015-05-23 MED ORDER — AMLODIPINE BESYLATE 5 MG PO TABS: 5.0000 mg | ORAL_TABLET | Freq: Every day | ORAL | Status: DC

## 2015-05-23 NOTE — Discharge Summary (Addendum)
[facility]  Physician Discharge Summary   Patient ID: Clayton [name] MRN: [id number] DOB/AGE: 11/02/1919 82 y.o.  Admit date: 05/22/2015 Discharge date: 05/23/2015  PCP: Michiel Sites, MD  DISCHARGE DIAGNOSES:  Principal Problem:   Hyperkalemia Active Problems:   Essential hypertension, benign   Diabetes mellitus without complication (HCC)   Acute renal failure superimposed on stage 3 chronic kidney disease (HCC)   AKI (acute kidney injury) (HCC)   RECOMMENDATIONS FOR OUTPATIENT FOLLOW UP: 1. Patient instructed to stop taking lisinopril. Started on amlodipine instead 2. To discuss with his PCP regarding his T8 compression fracture 3. Monitor Hemoglobin.  4. He declined home health. He was asked to call his PCP of he changes his mind.  DISCHARGE CONDITION: fair  Diet recommendation: As before  Filed Weights   05/22/15 1839  Weight: 60.147 kg (132 lb 9.6 oz)    INITIAL HISTORY: Clayton [name] is a 82 y.o. male with a past medical history of hypertension and diabetes who was in his usual state of health until this past Friday when he was walking in his house when he stumbled and fell and hit his upper body against a door. And since then he has had pain in his right upper chest area. He has been taking tramadol without any significant relief. He is somewhat of a poor historian. He is unable to tell if he has any upper back pain or not. He denies any shortness of breath. No nausea, vomiting or chills. He has a chronic indwelling Foley for urinary retention. He was seen by his urologist this morning and had his Foley catheter changed.    HOSPITAL COURSE:   #1 Mild acute renal failure with hyperkalemia: This is thought to be secondary to poor oral intake in the last few days since his fall. Patient was given Kayexalate. He was gently hydrated. Renal function has improved. Potassium level is normal. Lisinopril could've contributed. His old lab results were reviewed.  He's always had borderline elevated potassium level. In view of this, lisinopril has been discontinued. This was explained to the patient and his family.   #2 Right upper chest pain: His chest wall is tender to palpation and it reproduces his pain. This is most likely musculoskeletal. Initial abnormal EKG was most likely due to his electrolyte abnormalities. Troponins were unremarkable. Chest x-ray does not show any rib fractures. Patient and family reassured. Echocardiogram report from October 2016 was reviewed. He had normal EF. No wall motion abnormalities noted. His pain is improved this morning.  #3 Compression fracture T8: Patient has had multiple vertebral fractures in the past. He has undergone vertebroplasty in the past. However, I am unable to determine if this compression fracture is causing patient's any symptoms. He is not able to tell me if he has upper back pain or not. He was seen by physical therapy. He was able to ambulate without much difficulty using a walker. Not detected to have any discomfort doing so. At this time do not recommend vertebroplasty. He should discuss this further with his primary care physician.   #4 history of essential hypertension: Patient's lisinopril has been discontinued as mentioned above. He has been given a prescription for amlodipine.   #5 history of diabetes mellitus type 2: He may resume his home medication regimen.   Hemoglobin slightly lower this morning, likely due to hemodilution. No evidence for bleeding. He has a known history of anemia. He gets iron infusions. Follow-up with PCP.  He is on Keflex for  cellulitis involving his neck. He should complete the course of antibiotics.  Overall, stable. Feels better this morning. Labs have improved. Okay for discharge home today. Discussed with his wife and daughter. He declined home health. He was asked to call his PCP of he changes his mind.   PERTINENT LABS:  The results of significant diagnostics  from this hospitalization (including imaging, microbiology, ancillary and laboratory) are listed below for reference.    Microbiology: Recent Results (from the past 240 hour(s))  Urine culture     Status: None   Collection Time: 05/22/15  1:10 PM  Result Value Ref Range Status   Specimen Description URINE, RANDOM Bag ped  Final   Special Requests NONE  Final   Culture   Final    NO GROWTH 1 DAY Performed at [facility]    Report Status 05/23/2015 FINAL  Final     Labs: Basic Metabolic Panel:  Recent Labs Lab 05/22/15 1157 05/22/15 1924 05/23/15 0110  NA 140 143 140  K 6.0* 5.4* 4.8  CL 105 109 107  CO2 GLUCOSE 188* 148* 123*  BUN 33* 31* 27*  CREATININE 1.74* 1.68* 1.50*  CALCIUM 9.2 9.3 8.5*   CBC:  Recent Labs Lab 05/22/15 1157 05/23/15 0110  WBC 10.1 9.5  HGB 9.5* 8.3*  HCT 30.7* 25.7*  MCV 95.0 94.1  PLT 239 201   Cardiac Enzymes:  Recent Labs Lab 05/22/15 1924 05/23/15 0110  TROPONINI 0.03 0.04*   CBG:  Recent Labs Lab 05/22/15 2103 05/23/15 0727 05/23/15 1159  GLUCAP 116* 86 126*     IMAGING STUDIES Dg Chest 2 View  05/22/2015  CLINICAL DATA:  Right chest pain after recent fall. EXAM: CHEST  2 VIEW COMPARISON:  03/06/2015 chest radiograph. FINDINGS: A single screw overlies the right proximal humerus. Stable cardiomediastinal silhouette with normal heart size and moderate to large hiatal hernia. No pneumothorax. No pleural effusion. Mild scarring versus atelectasis at the left lung base. No pulmonary edema. No acute consolidative airspace disease. There is a severe T8 vertebral compression fracture with approximately 70% loss of vertebral body height, which appears worsened compared to the 03/07/2015 CT study where there was approximately 20% loss of vertebral body height. There is stable chronic T12 and L1 vertebral compression fractures status post vertebroplasty. IMPRESSION: 1. Severe T8 vertebral compression fracture, with  interval worsening since 03/07/2015. 2. No active cardiopulmonary disease. 3. Stable moderate to large hiatal hernia. Electronically Signed   By: Delbert Phenix M.D.   On: 05/22/2015 10:47     DISCHARGE EXAMINATION: Filed Vitals:   05/22/15 1800 05/22/15 1839 05/22/15 2044 05/23/15 0459  BP: 148/50 179/68 162/67 139/80  Pulse: 70 71 73 70  Temp:  97.7 F (36.5 C) 98.4 F (36.9 C) 98.3 F (36.8 C)  TempSrc:  Oral Oral Oral  Resp: Height:   (1.803 m)    Weight:  60.147 kg (132 lb 9.6 oz)    SpO2: 97% 97% 95% 97%   General appearance: alert, cooperative, appears stated age and no distress Resp: clear to auscultation bilaterally Cardio: regular rate and rhythm, S1, S2 normal, no murmur, click, rub or gallop GI: soft, non-tender; bowel sounds normal; no masses,  no organomegaly Extremities: extremities normal, atraumatic, no cyanosis or edema  DISPOSITION: Home  Discharge Instructions    Call MD for:  difficulty breathing, headache or visual disturbances    Complete by:  As directed  Call MD for:  extreme fatigue    Complete by:  As directed      Call MD for:  persistant dizziness or light-headedness    Complete by:  As directed      Call MD for:  persistant nausea and vomiting    Complete by:  As directed      Call MD for:  severe uncontrolled pain    Complete by:  As directed      Diet Carb Modified    Complete by:  As directed      Discharge instructions    Complete by:  As directed   Please follow up with your PCP in 1 week. Please talk to him about further issues with pain and if your back pain gets worse. Please stop taking Lisinopril as instructed.  You were cared for by a hospitalist during your hospital stay. If you have any questions about your discharge medications or the care you received while you were in the hospital after you are discharged, you can call the unit and asked to speak with the hospitalist on call if the hospitalist that took care  of you is not available. Once you are discharged, your primary care physician will handle any further medical issues. Please note that NO REFILLS for any discharge medications will be authorized once you are discharged, as it is imperative that you return to your primary care physician (or establish a relationship with a primary care physician if you do not have one) for your aftercare needs so that they can reassess your need for medications and monitor your lab values. If you do not have a primary care physician, you can call [phone] for a physician referral.     Increase activity slowly    Complete by:  As directed            ALLERGIES:  Allergies  Allergen Reactions  . Sulfa Antibiotics Itching     Discharge Medication List as of 05/23/2015 12:17 PM    START taking these medications   Details  amLODipine (NORVASC) 5 MG tablet Take 1 tablet (5 mg total) by mouth at bedtime., Starting 05/23/2015, Until Discontinued, Print      CONTINUE these medications which have NOT CHANGED   Details  cephALEXin (KEFLEX) 500 MG capsule Take 500 mg by mouth 3 (three) times daily. Started 01/10 for 13 days, Until Discontinued, Historical Med    metFORMIN (GLUCOPHAGE-XR) 500 MG 24 hr tablet Take 500 mg by mouth daily with breakfast., Until Discontinued, Historical Med    tamsulosin (FLOMAX) 0.4 MG CAPS capsule Take 0.4 mg by mouth daily as needed (or urine output). , Until Discontinued, Historical Med    traMADol (ULTRAM) 50 MG tablet Take 50 mg by mouth 3 (three) times daily as needed (for pain)., Until Discontinued, Historical Med      STOP taking these medications     lisinopril (PRINIVIL,ZESTRIL) 20 MG tablet        Follow-up Information    Follow up with Michiel Sites, MD. Go on 05/30/2015.   Specialty:  Endocrinology   Why:  post hospitalization follow up- at 130pm   Contact information:   [address] [place] [address] [phone]       TOTAL  DISCHARGE TIME: 35 mins  [facility]  [facility] Pager [phone]  05/23/2015, 5:20 PM

## 2015-05-23 NOTE — Evaluation (Signed)
Occupational Therapy Evaluation Patient Details Name: Clayton [name] MRN: [id number] DOB: 03-17-1920 Today's Date: 05/23/2015    History of Present Illness 82 y.o. male with a past medical history of hypertension and diabetes who was in his usual state of health until this past Friday when he was walking he stumbled and fell and hit his upper body against a door. Pt has been having pain in his right upper chest area since fall.  Pt was admitted 05/22/15 for mild acute renal failure and hyperkalemia   Clinical Impression   Pt admitted with s/p fall. Pt currently with functional limitations due to the deficits listed below (see OT Problem List).  Pt will benefit from skilled OT to increase their safety and independence with ADL and functional mobility for ADL to facilitate discharge to venue listed below.      Follow Up Recommendations  Home health OT;Supervision/Assistance - 24 hour    Equipment Recommendations  None recommended by OT    Recommendations for Other Services       Precautions / Restrictions Precautions Precautions: Fall      Mobility Bed Mobility Overal bed mobility: Needs Assistance Bed Mobility: Supine to Sit     Supine to sit: Min assist     General bed mobility comments: pt in chair  Transfers Overall transfer level: Needs assistance Equipment used: 1 person hand held assist Transfers: Sit to/from Stand Sit to Stand: Min assist         General transfer comment: sit to stand  x5. Pt with increased I with each attempt    Balance Overall balance assessment: Needs assistance Sitting-balance support: Feet supported Sitting balance-Leahy Scale: Good     Standing balance support: Single extremity supported Standing balance-Leahy Scale: Fair Standing balance comment: uses walker for mobility                            ADL Overall ADL's : Needs assistance/impaired Eating/Feeding: Set up;Sitting   Grooming: Set up;Sitting   Upper  Body Bathing: Set up;Sitting   Lower Body Bathing: Sit to/from stand;Cueing for safety;Moderate assistance   Upper Body Dressing : Set up;Sitting   Lower Body Dressing: Sit to/from stand;Cueing for safety;Moderate assistance   Toilet Transfer: Minimal assistance;Cueing for safety   Toileting- Clothing Manipulation and Hygiene: Minimal assistance;Sit to/from stand                         Pertinent Vitals/Pain Pain Assessment: No/denies pain (not right now) Faces Pain Scale: Hurts little more Pain Location: upper sternum and clavicle area - belched and then denied further pain Pain Intervention(s): Monitored during session     Hand Dominance     Extremity/Trunk Assessment Upper Extremity Assessment Upper Extremity Assessment: Generalized weakness (LUE with more ROM than RUE. No pain with ROM)   Lower Extremity Assessment Lower Extremity Assessment: Generalized weakness   Cervical / Trunk Assessment Cervical / Trunk Assessment: Kyphotic   Communication Communication Communication: HOH   Cognition Arousal/Alertness: Awake/alert Behavior During Therapy: WFL for tasks assessed/performed Overall Cognitive Status: Within Functional Limits for tasks assessed                                Home Living Family/patient expects to be discharged to:: Private residence Living Arrangements: Spouse/significant other   Type of Home: Independent living facility  Home Layout: One level     Bathroom Shower/Tub: Producer, television/film/video: Handicapped height     Home Equipment: Environmental consultant - 4 wheels      Lives With: Spouse    Prior Functioning/Environment Level of Independence: Independent with assistive device(s)        Comments: ambulates to dining hall with spouse using rollator for meals    OT Diagnosis: Generalized weakness   OT Problem List: Decreased strength;Decreased activity tolerance   OT Treatment/Interventions: Self-care/ADL  training;DME and/or AE instruction;Patient/family education    OT Goals(Current goals can be found in the care plan section) ADL Goals Pt Will Perform Grooming: with supervision;sitting;standing Pt Will Perform Lower Body Dressing: with supervision;sit to/from stand Pt Will Transfer to Toilet: with supervision;regular height toilet;ambulating Pt Will Perform Toileting - Clothing Manipulation and hygiene: with supervision;sit to/from stand  OT Frequency: Min 2X/week              End of Session Nurse Communication: Mobility status  Activity Tolerance: Patient tolerated treatment well Patient left: in chair;with chair alarm set;with call bell/phone within reach   Time: 1220-1241 OT Time Calculation (min): 21 min Charges:  OT General Charges $OT Visit: 1 Procedure OT Evaluation $OT Eval Low Complexity: 1 Procedure G-Codes:    Alba Cory 2015/06/21, 1:22 PM

## 2015-05-23 NOTE — Progress Notes (Signed)
Went over all discharge information with patient and family.  All questions answered.  VSS.  Discharge summary and prescriptions given to patient.  Will wheel patient out after he eats lunch.

## 2015-05-23 NOTE — Care Management Note (Signed)
Case Management Note  Patient Details  Name: DEVARIUS NELLES MRN: [id number] Date of Birth: 1919/05/09  Subjective/Objective: 82 y/o m admitted w/ hyperkalemia. From home.PT-no f/u.                   Action/Plan:d/c home   Expected Discharge Date:   (unknown)               Expected Discharge Plan:  Home/Self Care  In-House Referral:     Discharge planning Services  CM Consult  Post Acute Care Choice:    Choice offered to:     DME Arranged:    DME Agency:     HH Arranged:    HH Agency:     Status of Service:  Completed, signed off  Medicare Important Message Given:    Date Medicare IM Given:    Medicare IM give by:    Date Additional Medicare IM Given:    Additional Medicare Important Message give by:     If discussed at Long Length of Stay Meetings, dates discussed:    Additional Comments:  Lanier Clam, RN 05/23/2015, 11:46 AM

## 2015-05-23 NOTE — Progress Notes (Signed)
Went over all discharge information with patient and family.  All questions answered.  Discharge summary and prescriptions given to family.  Will be wheeled out by NT when ready.

## 2015-05-23 NOTE — Evaluation (Signed)
Physical Therapy Evaluation Patient Details Name: Clayton [name] MRN: [id number] DOB: 06-13-1919 Today's Date: 05/23/2015   History of Present Illness  82 y.o. male with a past medical history of hypertension and diabetes who was in his usual state of health until this past Friday when he was walking he stumbled and fell and hit his upper body against a door. Pt has been having pain in his right upper chest area since fall.  Pt was admitted 05/22/15 for mild acute renal failure and hyperkalemia  Clinical Impression  Pt admitted with above diagnosis. Pt currently with functional limitations due to the deficits listed below (see PT Problem List).  Pt will benefit from skilled PT to increase their independence and safety with mobility to allow discharge to the venue listed below.   Pt ambulated good distance in hallway and reports he is from ILF with his spouse.  Pt likely close to his baseline.     Follow Up Recommendations No PT follow up;Supervision for mobility/OOB    Equipment Recommendations  None recommended by PT    Recommendations for Other Services       Precautions / Restrictions Precautions Precautions: Fall      Mobility  Bed Mobility Overal bed mobility: Needs Assistance Bed Mobility: Supine to Sit     Supine to sit: Min assist     General bed mobility comments: assist for trunk upright  Transfers Overall transfer level: Needs assistance Equipment used: Rolling walker (2 wheeled) Transfers: Sit to/from Stand Sit to Stand: Min guard         General transfer comment: verbal cues for hand placement  Ambulation/Gait Ambulation/Gait assistance: Min guard Ambulation Distance (Feet): 240 Feet Assistive device: Rolling walker (2 wheeled) Gait Pattern/deviations: Step-through pattern;Decreased stride length;Trunk flexed     General Gait Details: verbal cues for safe use of RW (pt reports using rollator typically), no c/o dizziness  Stairs             Wheelchair Mobility    Modified Rankin (Stroke Patients Only)       Balance Overall balance assessment: Needs assistance         Standing balance support: Bilateral upper extremity supported Standing balance-Leahy Scale: Poor Standing balance comment: uses walker for mobility                             Pertinent Vitals/Pain Pain Assessment: Faces Faces Pain Scale: Hurts little more Pain Location: upper sternum and clavicle area - belched and then denied further pain Pain Intervention(s): Monitored during session    Home Living   Living Arrangements: Spouse/significant other   Type of Home: Independent living facility         Home Equipment: Walker - 4 wheels      Prior Function Level of Independence: Independent with assistive device(s)         Comments: ambulates to dining hall with spouse using rollator for meals     Hand Dominance        Extremity/Trunk Assessment               Lower Extremity Assessment: Generalized weakness      Cervical / Trunk Assessment: Kyphotic  Communication   Communication: HOH  Cognition Arousal/Alertness: Awake/alert Behavior During Therapy: WFL for tasks assessed/performed Overall Cognitive Status: Within Functional Limits for tasks assessed  General Comments      Exercises        Assessment/Plan    PT Assessment Patient needs continued PT services  PT Diagnosis Difficulty walking   PT Problem List Decreased strength;Decreased activity tolerance;Decreased mobility  PT Treatment Interventions DME instruction;Gait training;Functional mobility training;Patient/family education;Therapeutic activities;Therapeutic exercise   PT Goals (Current goals can be found in the Care Plan section) Acute Rehab PT Goals PT Goal Formulation: With patient Time For Goal Achievement: 06/06/15 Potential to Achieve Goals: Fair    Frequency Min 3X/week   Barriers to  discharge        Co-evaluation               End of Session Equipment Utilized During Treatment: Gait belt Activity Tolerance: Patient tolerated treatment well Patient left: in chair;with call bell/phone within reach;with chair alarm set           Time: 1610-9604 PT Time Calculation (min) (ACUTE ONLY): 16 min   Charges:   PT Evaluation $PT Eval Low Complexity: 1 Procedure     PT G Codes:        Tephanie Escorcia,KATHrine E 05/23/2015, 11:45 AM Zenovia Jarred, PT, DPT 05/23/2015 Pager: [phone]

## 2015-05-23 NOTE — Discharge Instructions (Signed)
Spinal Compression Fracture °A spinal compression fracture is a collapse of the bones that form the spine (vertebrae). With this type of fracture, the vertebrae become squashed (compressed) into a wedge shape. Most compression fractures happen in the middle or lower part of the spine. °CAUSES °This condition may be caused by: °· Thinning and loss of density in the bones (osteoporosis). This is the most common cause. °· A fall. °· A car or motorcycle accident. °· Cancer. °· Trauma, such as a heavy, direct hit to the head. °RISK FACTORS °You may be at greater risk for a spinal compression fracture if you: °· Are 50 years old or older. °· Have osteoporosis. °· Have certain types of cancer, including: °¨ Multiple myeloma. °¨ Lymphoma. °¨ Prostate cancer. °¨ Lung cancer. °¨ Breast cancer. °SYMPTOMS °Symptoms of this condition include: °· Severe pain. °· Pain that gets worse over time. °· Pain that is worse when you stand, walk, sit, or bend. °· Sudden pain that is so bad that it is hard for you to move. °· Bending or humping of the spine. °· Gradual loss of height. °· Numbness, tingling, or weakness in the back and legs. °· Trouble walking. °Your symptoms will depend on the cause of the fracture and how quickly it develops. For example, fractures that are caused by osteoporosis can cause few symptoms, no symptoms, or symptoms that develop slowly over time. °DIAGNOSIS °This condition may be diagnosed based on symptoms, medical history, and a physical exam. During the physical exam, your health care provider may tap along the length of your spine to check for tenderness. Tests may be done to confirm the diagnosis. They may include: °· A bone density test to check for osteoporosis. °· Imaging tests, such as a spine X-ray, a CT scan, or MRI. °TREATMENT °Treatment for this condition depends on the cause and severity of the condition. Some fractures, such as those that are caused by osteoporosis, may heal on their own with  supportive care. This may include: °· Pain medicine. °· Rest. °· A back brace. °· Physical therapy exercises. °· Medicine that reduces bone pain. °· Calcium and vitamin D supplements. °Fractures that cause the back to become misshapen, cause nerve pain or weakness, or do not respond to other treatment may be treated with a surgical procedure, such as: °· Vertebroplasty. In this procedure, bone cement is injected into the collapsed vertebrae to stabilize them. °· Balloon kyphoplasty. In this procedure, the collapsed vertebrae are expanded with a balloon and then bone cement is injected into them. °· Spinal fusion. In this procedure, the collapsed vertebrae are connected (fused) to normal vertebrae. °HOME CARE INSTRUCTIONS °General Instructions °· Take medicines only as directed by your health care provider. °· Do not drive or operate heavy machinery while taking pain medicine. °· If directed, apply ice to the injured area: °¨ Put ice in a plastic bag. °¨ Place a towel between your skin and the bag. °¨ Leave the ice on for 30 minutes every two hours at first. Then apply the ice as needed. °· Wear your neck brace or back brace as directed by your health care provider. °· Do not drink alcohol. Alcohol can interfere with your treatment. °· Keep all follow-up visits as directed by your health care provider. This is important. It can help to prevent permanent injury, disability, and long-lasting (chronic) pain. °Activity °· Stay in bed (on bed rest) only as directed by your health care provider. Being on bed rest for too long can   make your condition worse.  Return to your normal activities as directed by your health care provider. Ask what activities are safe for you.  Do exercises to improve motion and strength in your back (physical therapy), as recommended by your health care provider.  Exercise regularly as directed by your health care provider. SEEK MEDICAL CARE IF:  You have a fever.  You develop a cough  that makes your pain worse.  Your pain medicine is not helping.  Your pain does not get better over time.  You cannot return to your normal activities as planned or expected. SEEK IMMEDIATE MEDICAL CARE IF:  Your pain is very bad and it suddenly gets worse.  You are unable to move any body part (paralysis) that is below the level of your injury.  You have numbness, tingling, or weakness in any body part that is below the level of your injury.  You cannot control your bladder or bowels.   This information is not intended to replace advice given to you by your health care provider. Make sure you discuss any questions you have with your health care provider.   Document Released: 04/22/2005 Document Revised: 09/06/2014 Document Reviewed: 04/26/2014 Elsevier Interactive Patient Education [employer].

## 2015-05-30 ENCOUNTER — Ambulatory Visit: Payer: Medicare Other | Admitting: Speech Pathology

## 2015-05-30 DIAGNOSIS — R1312 Dysphagia, oropharyngeal phase: Secondary | ICD-10-CM

## 2015-05-30 NOTE — Patient Instructions (Signed)
  Push out your tongue straight, push against the tongue depressor, giving your tongue some resistance.  Hold 3 seconds.    Put your tongue in your cheek, use your fingers to push in, giving your tongue resistance Alternate side to side  Tongue scrape the roof of your mouth starting behind your front teeth, scrape back to your throat  Big lick, stick out your tongue, down to your chin and curl it up to your nose  10x twice a day for all - 10 x each side for tongue in cheek

## 2015-05-30 NOTE — Therapy (Signed)
[facility] [address] [place], [place], [address] Phone: [phone]   Fax:  [phone]  Speech Language Pathology Treatment  Patient Details  Name: Clayton [name] MRN: [id number] Date of Birth: [birth year] No Data Recorded  Encounter Date: 05/30/2015      End of Session - 05/30/15 1149    Visit Number 2   Number of Visits 17   Date for SLP Re-Evaluation 07/20/15   SLP Start Time 1103   SLP Stop Time  1142   SLP Time Calculation (min) 39 min      Past Medical History  Diagnosis Date  . Macular degeneration   . Hypertension   . Diabetes mellitus without complication (HCC)   . paroxysmal afib   . CKD (chronic kidney disease), stage III   . Kidney stones   . Distended bladder   . Hiatal hernia   . Diverticulosis     on scan  . Syncopal episodes     Past Surgical History  Procedure Laterality Date  . Orif humerus fracture Right 06/26/2012    Procedure: OPEN REDUCTION INTERNAL FIXATION (ORIF) PROXIMAL HUMERUS FRACTURE, right ;  Surgeon: Budd Palmer, MD;  Location: [facility] OR;  Service: Orthopedics;  Laterality: Right;  . Kidney stone surgery      since young age    There were no vitals filed for this visit.  Visit Diagnosis: Dysphagia, oropharyngeal      Subjective Assessment - 05/30/15 1110    Subjective "He fell and had his salivary glands blocked, so we haven't practiced"   Patient is accompained by: Family member   Special Tests daugher [name]               ADULT SLP TREATMENT - 05/30/15 1110    General Information   Behavior/Cognition Alert;Cooperative;Pleasant mood   Treatment Provided   Treatment provided Dysphagia   Dysphagia Treatment   Temperature Spikes Noted No   Respiratory Status Room air   Liquids provided via Cup   Other treatment/comments Continued training of dysphagia HEP with occasional min to mod A. Pt had been hospitalized and his daughter out of town since  eval, so they both required re-education and frequent verbal instructions. Pt benefitted from exagerrated visual cues, using my hand to show how his tongue should be moving. I added 4 lingual exercises to HEP. Daughter present for training.    Pain Assessment   Pain Assessment 0-10   Pain Score 5    Pain Location both arms   Assessment / Recommendations / Plan   Plan Continue with current plan of care   Progression Toward Goals   Progression toward goals Progressing toward goals          SLP Education - 05/30/15 1146    Education provided Yes   Education Details dysphagia HEP   Person(s) Educated Patient;Child(ren)   Methods Explanation;Demonstration;Tactile cues;Verbal cues;Handout   Comprehension Returned demonstration;Verbalized understanding;Need further instruction;Tactile cues required;Verbal cues required          SLP Short Term Goals - 05/30/15 1148    SLP SHORT TERM GOAL #1   Title Pt will perform dysphagia HEP with occasional min A   Time 3   Period Weeks   Status New   SLP SHORT TERM GOAL #2   Title Pt will follow swallow precautions with occasional min A   Time 3   Period Weeks   Status New   SLP SHORT TERM GOAL #3   Title  Pt /caregiver will verbalize 4 s/s of aspiration pna with rare min A   Time 3   Period Weeks   Status New          SLP Long Term Goals - 05/30/15 1149    SLP LONG TERM GOAL #1   Title Pt will perfrom dysphagia HEP with rare min A   Time 7   Period Weeks   Status New   SLP LONG TERM GOAL #2   Title Pt will follow swallow precautions with rare min A over 2 sessions   Time 7   Period Weeks   Status New          Plan - 05/30/15 1147    Clinical Impression Statement Pt required continued training for dysphagia HEPwith added lingual exercises today. Continue skilled ST to maximize carryover of HEP, swallow precautions and safety of swallow   Speech Therapy Frequency 2x / week   Treatment/Interventions Aspiration precaution  training;Pharyngeal strengthening exercises;Diet toleration management by SLP;Compensatory techniques;Environmental controls;Internal/external aids;SLP instruction and feedback;Patient/family education   Potential to Achieve Goals Fair   Potential Considerations Cooperation/participation level   SLP Home Exercise Plan see pt instructions    Consulted and Agree with Plan of Care Patient;Family member/caregiver   Family Member Consulted daughter, Bobbye Riggs        Problem List Patient Active Problem List   Diagnosis Date Noted  . AKI (acute kidney injury) (HCC) 05/22/2015  . Acute renal failure superimposed on stage 3 chronic kidney disease (HCC) 02/07/2015  . Syncope 02/07/2015  . Normocytic anemia 02/07/2015  . Hyperkalemia 02/07/2015  . Diabetes mellitus without complication (HCC)   . paroxysmal afib   . Absolute anemia   . Faintness   . Absent pedal pulses 12/28/2014  . Essential hypertension, benign 06/27/2012  . Proximal humerus fracture, right 06/24/2012    Clayton [name], Radene Journey MS, CCC-SLP 05/30/2015, 11:50 AM  [facility] [address] [place], [place], [address] Phone: [phone]   Fax:  [phone]   Name: Clayton [name] MRN: [id number] Date of Birth: 02-Sep-1919

## 2015-06-01 ENCOUNTER — Encounter (HOSPITAL_COMMUNITY)
Admission: RE | Admit: 2015-06-01 | Discharge: 2015-06-01 | Disposition: A | Discharge status: Home / Self Care | Payer: Medicare Other | Source: Ambulatory Visit | Attending: Internal Medicine | Admitting: Internal Medicine

## 2015-06-01 ENCOUNTER — Encounter (HOSPITAL_COMMUNITY): Payer: Self-pay

## 2015-06-01 ENCOUNTER — Ambulatory Visit: Payer: Medicare Other | Admitting: Speech Pathology

## 2015-06-01 VITALS — BP 107/45 | HR 57 | Temp 98.1°F | Resp 16 | Ht 71.0 in | Wt 134.0 lb

## 2015-06-01 DIAGNOSIS — D509 Iron deficiency anemia, unspecified: Secondary | ICD-10-CM | POA: Diagnosis not present

## 2015-06-01 DIAGNOSIS — R1312 Dysphagia, oropharyngeal phase: Secondary | ICD-10-CM | POA: Diagnosis not present

## 2015-06-01 MED ORDER — SODIUM CHLORIDE 0.9 % IV SOLN
Freq: Once | INTRAVENOUS | Status: AC
  Administered 2015-06-01: 250 mL via INTRAVENOUS

## 2015-06-01 MED ORDER — SODIUM CHLORIDE 0.9 % IV SOLN
510.0000 mg | INTRAVENOUS | Status: DC
  Administered 2015-06-01: 510 mg via INTRAVENOUS
  Filled 2015-06-01: qty 17

## 2015-06-01 NOTE — Patient Instructions (Signed)
Signs of Aspiration Pneumonia   . Chest pain/tightness . Fever (can be low grade) . Cough  o With foul-smelling phlegm (sputum) o With sputum containing pus or blood o With greenish sputum . Fatigue  . Shortness of breath  . Wheezing   **IF YOU HAVE THESE SIGNS, CONTACT YOUR DOCTOR OR GO TO THE EMERGENCY DEPARTMENT OR URGENT CARE AS SOON AS POSSIBLE**      

## 2015-06-01 NOTE — Therapy (Signed)
[facility] [address] [place], [place], [address] Phone: [phone]   Fax:  [phone]  Speech Language Pathology Treatment  Patient Details  Name: Clayton [name] MRN: [id number] Date of Birth: 28-Oct-1919 No Data Recorded  Encounter Date: 06/01/2015      End of Session - 06/01/15 1145    Visit Number 3   Number of Visits 17   Date for SLP Re-Evaluation 07/20/15   SLP Start Time 1103   SLP Stop Time  1142   SLP Time Calculation (min) 39 min      Past Medical History  Diagnosis Date  . Macular degeneration   . Hypertension   . Diabetes mellitus without complication (HCC)   . paroxysmal afib   . CKD (chronic kidney disease), stage III   . Kidney stones   . Distended bladder   . Hiatal hernia   . Diverticulosis     on scan  . Syncopal episodes     Past Surgical History  Procedure Laterality Date  . Orif humerus fracture Right 06/26/2012    Procedure: OPEN REDUCTION INTERNAL FIXATION (ORIF) PROXIMAL HUMERUS FRACTURE, right ;  Surgeon: Budd Palmer, MD;  Location: [facility] OR;  Service: Orthopedics;  Laterality: Right;  . Kidney stone surgery      since young age    There were no vitals filed for this visit.  Visit Diagnosis: Dysphagia, oropharyngeal      Subjective Assessment - 06/01/15 1146    Subjective "We did n't get the exercises done yesterday because he had a 4 hour appointment"   Patient is accompained by: Family member   Special Tests daugher [name]               ADULT SLP TREATMENT - 06/01/15 1130    General Information   Behavior/Cognition Alert;Cooperative;Pleasant mood   Treatment Provided   Treatment provided Dysphagia   Dysphagia Treatment   Other treatment/comments HEP training - pt improving, completing HEP with occasional min cues and extended time. He verbalize swallowing precautions with usual mod cues.    Pain Assessment   Pain Assessment 0-10   Pain Score 8    Pain Location right shoulder   Pain Intervention(s) Monitored during session   Assessment / Recommendations / Plan   Plan Continue with current plan of care   Progression Toward Goals   Progression toward goals Progressing toward goals            SLP Short Term Goals - 06/01/15 1145    SLP SHORT TERM GOAL #1   Title Pt will perform dysphagia HEP with occasional min A   Time 3   Period Weeks   Status Achieved   SLP SHORT TERM GOAL #2   Title Pt will follow swallow precautions with occasional min A   Time 3   Period Weeks   Status New   SLP SHORT TERM GOAL #3   Title Pt /caregiver will verbalize 4 s/s of aspiration pna with rare min A   Time 3   Period Weeks   Status New          SLP Long Term Goals - 06/01/15 1145    SLP LONG TERM GOAL #1   Title Pt will perfrom dysphagia HEP with rare min A   Time 7   Period Weeks   Status On-going   SLP LONG TERM GOAL #2   Title Pt will follow swallow precautions with rare min A over 2  sessions   Time 7   Period Weeks   Status On-going          Plan - 06/01/15 1145    Clinical Impression Statement Pt required continued training for dysphagia HEPwith added lingual exercises today. Continue skilled ST to maximize carryover of HEP, swallow precautions and safety of swallow        Problem List Patient Active Problem List   Diagnosis Date Noted  . AKI (acute kidney injury) (HCC) 05/22/2015  . Acute renal failure superimposed on stage 3 chronic kidney disease (HCC) 02/07/2015  . Syncope 02/07/2015  . Normocytic anemia 02/07/2015  . Hyperkalemia 02/07/2015  . Diabetes mellitus without complication (HCC)   . paroxysmal afib   . Absolute anemia   . Faintness   . Absent pedal pulses 12/28/2014  . Essential hypertension, benign 06/27/2012  . Proximal humerus fracture, right 06/24/2012    Lovvorn, Radene Journey MS, CCC-SLP 06/01/2015, 11:46 AM  [facility] [address] [place], [place], [address] Phone: [phone]   Fax:  [phone]   Name: Clayton [name] MRN: [id number] Date of Birth: Dec 15, 1919

## 2015-06-01 NOTE — Discharge Instructions (Signed)
Ferumoxytol injection What is this medicine? FERUMOXYTOL is an iron complex. Iron is used to make healthy red blood cells, which carry oxygen and nutrients throughout the body. This medicine is used to treat iron deficiency anemia in people with chronic kidney disease. This medicine may be used for other purposes; ask your health care provider or pharmacist if you have questions. What should I tell my health care provider before I take this medicine? They need to know if you have any of these conditions: -anemia not caused by low iron levels -high levels of iron in the blood -magnetic resonance imaging (MRI) test scheduled -an unusual or allergic reaction to iron, other medicines, foods, dyes, or preservatives -pregnant or trying to get pregnant -breast-feeding How should I use this medicine? This medicine is for injection into a vein. It is given by a health care professional in a hospital or clinic setting. Talk to your pediatrician regarding the use of this medicine in children. Special care may be needed. Overdosage: If you think you have taken too much of this medicine contact a poison control center or emergency room at once. NOTE: This medicine is only for you. Do not share this medicine with others. What if I miss a dose? It is important not to miss your dose. Call your doctor or health care professional if you are unable to keep an appointment. What may interact with this medicine? This medicine may interact with the following medications: -other iron products This list may not describe all possible interactions. Give your health care provider a list of all the medicines, herbs, non-prescription drugs, or dietary supplements you use. Also tell them if you smoke, drink alcohol, or use illegal drugs. Some items may interact with your medicine. What should I watch for while using this medicine? Visit your doctor or healthcare professional regularly. Tell your doctor or healthcare  professional if your symptoms do not start to get better or if they get worse. You may need blood work done while you are taking this medicine. You may need to follow a special diet. Talk to your doctor. Foods that contain iron include: whole grains/cereals, dried fruits, beans, or peas, leafy green vegetables, and organ meats (liver, kidney). What side effects may I notice from receiving this medicine? Side effects that you should report to your doctor or health care professional as soon as possible: -allergic reactions like skin rash, itching or hives, swelling of the face, lips, or tongue -breathing problems -changes in blood pressure -feeling faint or lightheaded, falls -fever or chills -flushing, sweating, or hot feelings -swelling of the ankles or feet Side effects that usually do not require medical attention (Report these to your doctor or health care professional if they continue or are bothersome.): -diarrhea -headache -nausea, vomiting -stomach pain This list may not describe all possible side effects. Call your doctor for medical advice about side effects. You may report side effects to FDA at 1-800-FDA-1088. Where should I keep my medicine? This drug is given in a hospital or clinic and will not be stored at home. NOTE: This sheet is a summary. It may not cover all possible information. If you have questions about this medicine, talk to your doctor, pharmacist, or health care provider.    2016, Elsevier/Gold Standard. (2011-12-06 15:23:36) IF YOU ARE GOING TO BE 15 OR MINUTES LATE FOR YOUR APPOINTMENT, PLEASE CALL [phone] TO MAKE OTHER ARRANGEMENTS FOR YOUR TREATMENT  IF YOU ARRIVE EARLY FOR YOUR SCHEDULED APPOINTMENT , YOU MAY HAVE TO  WAIT UNTIL YOUR SCHEDULED TIME.

## 2015-06-06 ENCOUNTER — Ambulatory Visit: Payer: Medicare Other | Admitting: Speech Pathology

## 2015-06-06 DIAGNOSIS — R1312 Dysphagia, oropharyngeal phase: Secondary | ICD-10-CM | POA: Diagnosis not present

## 2015-06-06 NOTE — Therapy (Signed)
[facility] [address] [place], [place], [address] Phone: [phone]   Fax:  [phone]  Speech Language Pathology Treatment  Patient Details  Name: Clayton [name] MRN: [id number] Date of Birth: [birth year] No Data Recorded  Encounter Date: 06/06/2015      End of Session - 06/06/15 1101    Visit Number 4   Number of Visits 17   Date for SLP Re-Evaluation 07/20/15   SLP Start Time 1016   SLP Stop Time  1058   SLP Time Calculation (min) 42 min      Past Medical History  Diagnosis Date  . Macular degeneration   . Hypertension   . Diabetes mellitus without complication (HCC)   . paroxysmal afib   . CKD (chronic kidney disease), stage III   . Kidney stones   . Distended bladder   . Hiatal hernia   . Diverticulosis     on scan  . Syncopal episodes     Past Surgical History  Procedure Laterality Date  . Orif humerus fracture Right 06/26/2012    Procedure: OPEN REDUCTION INTERNAL FIXATION (ORIF) PROXIMAL HUMERUS FRACTURE, right ;  Surgeon: Budd Palmer, MD;  Location: [facility] OR;  Service: Orthopedics;  Laterality: Right;  . Kidney stone surgery      since young age    There were no vitals filed for this visit.  Visit Diagnosis: Dysphagia, oropharyngeal      Subjective Assessment - 06/06/15 1024    Subjective "we practiced the exercises"   Patient is accompained by: Family member   Special Tests daugher [name]               ADULT SLP TREATMENT - 06/06/15 1034    General Information   Behavior/Cognition Alert;Cooperative;Pleasant mood   Patient Positioning Upright in chair   Treatment Provided   Treatment provided Dysphagia   Dysphagia Treatment   Respiratory Status Room air   Feeding Able to feed self   Type of cueing Verbal   Amount of cueing Moderate   Other treatment/comments Trained pt and daughter in swallow precautions with soft solid and thin liquid. Pt required occasional min A  for dysphagia HEP. I suspect he will continue to required family assistance and cueing to carryover precautions and HEP.   Pain Assessment   Pain Assessment No/denies pain   Pain Score 0-No pain   Assessment / Recommendations / Plan   Plan Continue with current plan of care   Progression Toward Goals   Progression toward goals Progressing toward goals          SLP Education - 06/06/15 1059    Education provided Yes   Education Details swallow precautions, HEP   Person(s) Educated Patient;Child(ren)   Methods Explanation;Demonstration;Tactile cues;Verbal cues   Comprehension Returned demonstration;Verbalized understanding;Verbal cues required;Tactile cues required          SLP Short Term Goals - 06/06/15 1101    SLP SHORT TERM GOAL #1   Title Pt will perform dysphagia HEP with occasional min A   Time 2   Period Weeks   Status Achieved   SLP SHORT TERM GOAL #2   Title Pt will follow swallow precautions with occasional min A   Time 2   Period Weeks   Status On-going   SLP SHORT TERM GOAL #3   Title Pt /caregiver will verbalize 4 s/s of aspiration pna with rare min A   Time 2   Period Weeks   Status  On-going          SLP Long Term Goals - 06/06/15 1101    SLP LONG TERM GOAL #1   Title Pt will perfrom dysphagia HEP with rare min A   Time 6   Period Weeks   Status On-going   SLP LONG TERM GOAL #2   Title Pt will follow swallow precautions with rare min A over 2 sessions   Time 6   Period Weeks   Status On-going          Plan - 06/06/15 1100    Clinical Impression Statement Continue skilled ST to maximize carryover of HEP and swallow precautions. If progress continues, consider decrease to 1x a week.   Speech Therapy Frequency 2x / week   Treatment/Interventions Aspiration precaution training;Pharyngeal strengthening exercises;Diet toleration management by SLP;Compensatory techniques;Environmental controls;Internal/external aids;SLP instruction and  feedback;Patient/family education   Potential to Achieve Goals Good   Potential Considerations Ability to learn/carryover information        Problem List Patient Active Problem List   Diagnosis Date Noted  . AKI (acute kidney injury) (HCC) 05/22/2015  . Acute renal failure superimposed on stage 3 chronic kidney disease (HCC) 02/07/2015  . Syncope 02/07/2015  . Normocytic anemia 02/07/2015  . Hyperkalemia 02/07/2015  . Diabetes mellitus without complication (HCC)   . paroxysmal afib   . Absolute anemia   . Faintness   . Absent pedal pulses 12/28/2014  . Essential hypertension, benign 06/27/2012  . Proximal humerus fracture, right 06/24/2012    Tevon Berhane, Radene Journey  MS, CCC-SLP  06/06/2015, 11:02 AM  [facility] [address] [place], [place], [address] Phone: [phone]   Fax:  [phone]   Name: Clayton [name] MRN: [id number] Date of Birth: [birth year]

## 2015-06-07 ENCOUNTER — Encounter (HOSPITAL_COMMUNITY): Payer: Self-pay

## 2015-06-07 ENCOUNTER — Encounter (HOSPITAL_COMMUNITY)
Admission: RE | Admit: 2015-06-07 | Discharge: 2015-06-07 | Disposition: A | Discharge status: Home / Self Care | Payer: Medicare (Managed Care) | Source: Ambulatory Visit | Attending: Internal Medicine | Admitting: Internal Medicine

## 2015-06-07 VITALS — BP 140/63 | HR 66 | Temp 97.9°F | Resp 16 | Wt 130.4 lb

## 2015-06-07 DIAGNOSIS — D509 Iron deficiency anemia, unspecified: Secondary | ICD-10-CM | POA: Insufficient documentation

## 2015-06-07 MED ORDER — SODIUM CHLORIDE 0.9 % IV SOLN
INTRAVENOUS | Status: DC
  Administered 2015-06-07: 14:00:00 via INTRAVENOUS

## 2015-06-07 MED ORDER — SODIUM CHLORIDE 0.9 % IV SOLN: 510.0000 mg | INTRAVENOUS | Status: DC

## 2015-06-07 MED ORDER — SODIUM CHLORIDE 0.9 % IV SOLN
510.0000 mg | INTRAVENOUS | Status: AC
  Administered 2015-06-07: 510 mg via INTRAVENOUS
  Filled 2015-06-07: qty 17

## 2015-06-07 NOTE — Discharge Instructions (Signed)

## 2015-06-08 ENCOUNTER — Ambulatory Visit: Payer: Medicare (Managed Care) | Attending: Internal Medicine | Admitting: Speech Pathology

## 2015-06-08 DIAGNOSIS — R1312 Dysphagia, oropharyngeal phase: Secondary | ICD-10-CM | POA: Insufficient documentation

## 2015-06-08 NOTE — Therapy (Signed)
[facility] [address] [place], [place], [address] Phone: [phone]   Fax:  [phone]  Speech Language Pathology Treatment  Patient Details  Name: Clayton [name] MRN: [id number] Date of Birth: [birth year] No Data Recorded  Encounter Date: 06/08/2015      End of Session - 06/08/15 1155    Visit Number 5   Number of Visits 17   Date for SLP Re-Evaluation 07/20/15      Past Medical History  Diagnosis Date  . Macular degeneration   . Hypertension   . Diabetes mellitus without complication (HCC)   . paroxysmal afib   . CKD (chronic kidney disease), stage III   . Kidney stones   . Distended bladder   . Hiatal hernia   . Diverticulosis     on scan  . Syncopal episodes     Past Surgical History  Procedure Laterality Date  . Orif humerus fracture Right 06/26/2012    Procedure: OPEN REDUCTION INTERNAL FIXATION (ORIF) PROXIMAL HUMERUS FRACTURE, right ;  Surgeon: Budd Palmer, MD;  Location: [facility] OR;  Service: Orthopedics;  Laterality: Right;  . Kidney stone surgery      since young age    There were no vitals filed for this visit.  Visit Diagnosis: Dysphagia, oropharyngeal      Subjective Assessment - 06/08/15 1114    Subjective "He has an appointment to see if we can get out the catheter"   Patient is accompained by: Family member   Special Tests daugher [name]               ADULT SLP TREATMENT - 06/08/15 1117    General Information   Behavior/Cognition Alert;Cooperative;Pleasant mood   Treatment Provided   Treatment provided Dysphagia   Dysphagia Treatment   Liquids provided via Cup   Type of cueing Verbal   Amount of cueing Minimal   Other treatment/comments HEP performed with occasional min A - daughter trained on appropriate cueing for carryover of HEP at home. Daughter reports success cueing her father at home. Daughter reports success helping her dad with the HEP. I educated her  that he will likely continue to need occasional cues for swallowing precautions and HEP. Daughter plans to train her mother (pt's sopouse ) to cue pt for his HEP. I encouraged her to bring her mom to observe therapy is she is able.    Pain Assessment   Pain Assessment No/denies pain   Cognitive-Linquistic Treatment   Treatment focused on Dysarthria   Assessment / Recommendations / Plan   Plan Continue with current plan of care   Progression Toward Goals   Progression toward goals Progressing toward goals          SLP Education - 06/08/15 1150    Education provided Yes   Education Details HEP, appropriate cueing for HEP   Person(s) Educated Patient;Spouse   Methods Explanation;Demonstration;Verbal cues   Comprehension Verbalized understanding;Verbal cues required;Returned demonstration;Tactile cues required          SLP Short Term Goals - 06/08/15 1154    SLP SHORT TERM GOAL #1   Title Pt will perform dysphagia HEP with occasional min A   Time 2   Period Weeks   Status Achieved   SLP SHORT TERM GOAL #2   Title Pt will follow swallow precautions with occasional min A   Time 2   Period Weeks   Status Achieved   SLP SHORT TERM GOAL #3   Title  Pt /caregiver will verbalize 4 s/s of aspiration pna with rare min A   Time 2   Period Weeks   Status On-going          SLP Long Term Goals - 06/08/15 1154    SLP LONG TERM GOAL #1   Title Pt will perfrom  lingual exercises on dysphagia HEP with rare min A   Time 6   Period Weeks   Status Revised   SLP LONG TERM GOAL #2   Title Family will cue pt for  swallow precautions with rare min A by ST.   Time 6   Period Weeks   Status Revised          Plan - 06/08/15 1151    Clinical Impression Statement Pt requiring reduced cues for most exercises in HEP - occasional min verbal and visual  cues. Due to reduced short term recall, daughter educated that pt will continue to required her ongoing cueing for HEP and measl. Due to  progress, decrease ST to 1x a week for 3-4 more weeks.   Speech Therapy Frequency 1x /week   Treatment/Interventions Aspiration precaution training;Pharyngeal strengthening exercises;Diet toleration management by SLP;Compensatory techniques;Environmental controls;Internal/external aids;SLP instruction and feedback;Patient/family education   Potential to Achieve Goals Good   Potential Considerations Ability to learn/carryover information   Consulted and Agree with Plan of Care Patient;Family member/caregiver   Family Member Consulted daughter, Bobbye Riggs        Problem List Patient Active Problem List   Diagnosis Date Noted  . AKI (acute kidney injury) (HCC) 05/22/2015  . Acute renal failure superimposed on stage 3 chronic kidney disease (HCC) 02/07/2015  . Syncope 02/07/2015  . Normocytic anemia 02/07/2015  . Hyperkalemia 02/07/2015  . Diabetes mellitus without complication (HCC)   . paroxysmal afib   . Absolute anemia   . Faintness   . Absent pedal pulses 12/28/2014  . Essential hypertension, benign 06/27/2012  . Proximal humerus fracture, right 06/24/2012    Lovvorn, Radene Journey MS, CCC-SLP 06/08/2015, 11:56 AM  [facility] [address] [place], [place], [address] Phone: [phone]   Fax:  [phone]   Name: HART HAAS MRN: [id number] Date of Birth: 08-15-1919

## 2015-06-13 ENCOUNTER — Encounter: Payer: Medicare Other | Admitting: Speech Pathology

## 2015-06-14 ENCOUNTER — Other Ambulatory Visit: Payer: Self-pay | Admitting: Urology

## 2015-06-15 ENCOUNTER — Ambulatory Visit: Payer: Medicare (Managed Care) | Admitting: Speech Pathology

## 2015-06-15 DIAGNOSIS — R1312 Dysphagia, oropharyngeal phase: Secondary | ICD-10-CM

## 2015-06-15 NOTE — Therapy (Signed)
[facility] [address] [place], [place], [address] Phone: [phone]   Fax:  [phone]  Speech Language Pathology Treatment  Patient Details  Name: Clayton [name] MRN: [id number] Date of Birth: [birth year] No Data Recorded  Encounter Date: 06/15/2015      End of Session - 06/15/15 1201    Visit Number 6   Number of Visits 17   Date for SLP Re-Evaluation 07/20/15   SLP Start Time 1105   SLP Stop Time  1145   SLP Time Calculation (min) 40 min   Activity Tolerance Patient tolerated treatment well      Past Medical History  Diagnosis Date  . Macular degeneration   . Hypertension   . Diabetes mellitus without complication (HCC)   . paroxysmal afib   . CKD (chronic kidney disease), stage III   . Kidney stones   . Distended bladder   . Hiatal hernia   . Diverticulosis     on scan  . Syncopal episodes     Past Surgical History  Procedure Laterality Date  . Orif humerus fracture Right 06/26/2012    Procedure: OPEN REDUCTION INTERNAL FIXATION (ORIF) PROXIMAL HUMERUS FRACTURE, right ;  Surgeon: Budd Palmer, MD;  Location: [facility] OR;  Service: Orthopedics;  Laterality: Right;  . Kidney stone surgery      since young age    There were no vitals filed for this visit.  Visit Diagnosis: Dysphagia, oropharyngeal      Subjective Assessment - 06/15/15 1152    Subjective "He says he's been doing some of the exercises on his own"   Patient is accompained by: Family member   Special Tests daugher [name]   Currently in Pain? No/denies               ADULT SLP TREATMENT - 06/15/15 1152    General Information   Behavior/Cognition Alert;Cooperative;Pleasant mood   Treatment Provided   Treatment provided Dysphagia   Dysphagia Treatment   Temperature Spikes Noted No   Feeding Able to feed self   Liquids provided via Cup   Type of cueing Verbal;Tactile   Amount of cueing Minimal   Other  treatment/comments Pt followed swallow precautions of 2 swallows per bite and sip of water with rare min cues. Daughter reports he is doing better with not "shoveling food in his mouth while he is still chewing." No over s/s of aspiration with PO trials of soft solid and thin liquids. Pt performed HEP with occasional min cues. Pt's spouse and daughter present today to observe ST cueing pt for home cueing.    Pain Assessment   Pain Assessment No/denies pain   Pain Score 0-No pain   Assessment / Recommendations / Plan   Plan Continue with current plan of care   Progression Toward Goals   Progression toward goals Progressing toward goals          SLP Education - 06/15/15 1157    Education provided Yes   Education Details Family cueing for HEP and swallowoing precautions   Person(s) Educated Patient;Spouse   Methods Explanation;Demonstration;Verbal cues   Comprehension Verbalized understanding;Returned demonstration          SLP Short Term Goals - 06/15/15 1200    SLP SHORT TERM GOAL #1   Title Pt will perform dysphagia HEP with occasional min A   Time 2   Period Weeks   Status Achieved   SLP SHORT TERM GOAL #2   Title Pt  will follow swallow precautions with occasional min A   Time 2   Period Weeks   Status Achieved   SLP SHORT TERM GOAL #3   Title Pt /caregiver will verbalize 4 s/s of aspiration pna with rare min A   Time 2   Period Weeks   Status Achieved          SLP Long Term Goals - 06/15/15 1200    SLP LONG TERM GOAL #1   Title Pt will perfrom  lingual exercises on dysphagia HEP with rare min A   Time 6   Period Weeks   Status Achieved   SLP LONG TERM GOAL #2   Title Family will cue pt for  swallow precautions with rare min A by ST.   Time 6   Period Weeks   Status Revised          Plan - 06/15/15 1158    Clinical Impression Statement Pt had made good progress, I believe he will continue to require occasoinal min cues to follow swallow precautions and  HEP. Continue skilled ST to maximize carryover of safe swallow precautions and HEP, 1 more visist if progress continues.    Speech Therapy Frequency 1x /week   Duration 1 week   Treatment/Interventions Aspiration precaution training;Pharyngeal strengthening exercises;Diet toleration management by SLP;Compensatory techniques;Environmental controls;Internal/external aids;SLP instruction and feedback;Patient/family education   Potential to Achieve Goals Good   Potential Considerations Ability to learn/carryover information        Problem List Patient Active Problem List   Diagnosis Date Noted  . AKI (acute kidney injury) (HCC) 05/22/2015  . Acute renal failure superimposed on stage 3 chronic kidney disease (HCC) 02/07/2015  . Syncope 02/07/2015  . Normocytic anemia 02/07/2015  . Hyperkalemia 02/07/2015  . Diabetes mellitus without complication (HCC)   . paroxysmal afib   . Absolute anemia   . Faintness   . Absent pedal pulses 12/28/2014  . Essential hypertension, benign 06/27/2012  . Proximal humerus fracture, right 06/24/2012    Yi Haugan, Radene Journey MS, CCC-SLP 06/15/2015, 12:01 PM  [facility] [address] [place], [place], [address] Phone: [phone]   Fax:  [phone]   Name: Clayton [name] MRN: [id number] Date of Birth: [birth year]

## 2015-06-22 ENCOUNTER — Ambulatory Visit: Payer: Medicare (Managed Care) | Admitting: Speech Pathology

## 2015-06-22 DIAGNOSIS — R1312 Dysphagia, oropharyngeal phase: Secondary | ICD-10-CM | POA: Diagnosis not present

## 2015-06-22 NOTE — Therapy (Signed)
[facility] [address] [place], [place], [address] Phone: [phone]   Fax:  [phone]  Speech Language Pathology Treatment  Patient Details  Name: Clayton [name] MRN: [id number] Date of Birth: 08-28-1919 No Data Recorded  Encounter Date: 06/22/2015      End of Session - 06/22/15 1200    Visit Number 7   Number of Visits 17   Date for SLP Re-Evaluation 07/20/15   SLP Start Time 1105   SLP Stop Time  1138   SLP Time Calculation (min) 33 min      Past Medical History  Diagnosis Date  . Macular degeneration   . Hypertension   . Diabetes mellitus without complication ([place])   . paroxysmal afib   . CKD (chronic kidney disease), stage III   . Kidney stones   . Distended bladder   . Hiatal hernia   . Diverticulosis     on scan  . Syncopal episodes     Past Surgical History  Procedure Laterality Date  . Orif humerus fracture Right 06/26/2012    Procedure: OPEN REDUCTION INTERNAL FIXATION (ORIF) PROXIMAL HUMERUS FRACTURE, right ;  Surgeon: Rozanna Box, MD;  Location: [facility];  Service: Orthopedics;  Laterality: Right;  . Kidney stone surgery      since young age    There were no vitals filed for this visit.  Visit Diagnosis: Dysphagia, oropharyngeal      Subjective Assessment - 06/22/15 1130    Subjective "I've been doing some exercises at home"   Patient is accompained by: Family member   Special Tests daugher Margarita Grizzle   Currently in Pain? No/denies               ADULT SLP TREATMENT - 06/22/15 1149    General Information   Behavior/Cognition Alert;Cooperative;Pleasant mood   Treatment Provided   Treatment provided Dysphagia   Dysphagia Treatment   Other treatment/comments Pt completed HEP with rare to occasional min verbal cues. Pt verbalized swallow precautions  with mod I and verbalized s/s of aspiration pna with rare min questioning cues. Daughter demonstrated appropriate cueing  during HEP with mod I.    Pain Assessment   Pain Assessment No/denies pain   Assessment / Recommendations / Plan   Plan Discharge SLP treatment due to (comment)   Progression Toward Goals   Progression toward goals Goals met, education completed, patient discharged from [place]  Visits from Start of Care: 7  Current functional level related to goals / functional outcomes: See goals below   Remaining deficits: Oropharyngeal dysphagia, continue HEP and swallow precautions    Education / Equipment: HEP, swallow precautions, s/s of aspiration pna Plan: Patient agrees to discharge.  Patient goals were met. Patient is being discharged due to meeting the stated rehab goals.  ?????           SLP Short Term Goals - 06/22/15 1200    SLP SHORT TERM GOAL #1   Title Pt will perform dysphagia HEP with occasional min A   Time 2   Period Weeks   Status Achieved   SLP SHORT TERM GOAL #2   Title Pt will follow swallow precautions with occasional min A   Time 2   Period Weeks   Status Achieved   SLP SHORT TERM GOAL #3   Title Pt /caregiver will verbalize 4 s/s of aspiration pna with rare min A   Time  2   Period Weeks   Status Achieved          SLP Long Term Goals - 2015/07/07 1200    SLP LONG TERM GOAL #1   Title Pt will perfrom  lingual exercises on dysphagia HEP with rare min A   Time 6   Period Weeks   Status Achieved   SLP LONG TERM GOAL #2   Title Family will cue pt for  swallow precautions with rare min A by ST.   Time 6   Period Weeks   Status Achieved          Plan - 07-07-15 1158    Clinical Impression Statement Pt has met all goals, d/c from [place] with instructionsn to continue HEP at home 5/7 days a week and to continue swallow precautions upon d/c to reduce risk of aspiration pna.    Speech Therapy Frequency 1x /week   Treatment/Interventions Aspiration precaution training;Pharyngeal strengthening exercises;Diet toleration  management by SLP;Compensatory techniques;Environmental controls;Internal/external aids;SLP instruction and feedback;Patient/family education   Potential to Achieve Goals Good   Potential Considerations Ability to learn/carryover information   Consulted and Agree with Plan of Care Patient;Family member/caregiver   Family Member Consulted daughter, Jon Billings          G-Codes - 07/07/15 July 07, 1200    Functional Assessment Tool Used NOMS   Functional Limitations Swallowing   Swallow Goal Status [phone]) At least 20 percent but less than 40 percent impaired, limited or restricted   Swallow Discharge Status [phone]) At least 20 percent but less than 40 percent impaired, limited or restricted      Problem List Patient Active Problem List   Diagnosis Date Noted  . AKI (acute kidney injury) ([place]) 05/22/2015  . Acute renal failure superimposed on stage 3 chronic kidney disease ([place]) 02/07/2015  . Syncope 02/07/2015  . Normocytic anemia 02/07/2015  . Hyperkalemia 02/07/2015  . Diabetes mellitus without complication ([place])   . paroxysmal afib   . Absolute anemia   . Faintness   . Absent pedal pulses 12/28/2014  . Essential hypertension, benign 06/27/2012  . Proximal humerus fracture, right 06/24/2012    Bilal Manzer, Annye Rusk MS, CCC-SLP July 07, 2015, 12:03 PM  [facility] [address] [place], [place], [address] Phone: [phone]   Fax:  [phone]   Name: HAROUN COTHAM MRN: [id number] Date of Birth: [birth year]

## 2015-06-23 ENCOUNTER — Encounter (HOSPITAL_COMMUNITY): Payer: Self-pay

## 2015-06-23 ENCOUNTER — Encounter (HOSPITAL_COMMUNITY)
Admission: RE | Admit: 2015-06-23 | Discharge: 2015-06-23 | Disposition: A | Discharge status: Home / Self Care | Payer: Medicare (Managed Care) | Source: Ambulatory Visit | Attending: Urology | Admitting: Urology

## 2015-06-23 DIAGNOSIS — Z01812 Encounter for preprocedural laboratory examination: Secondary | ICD-10-CM | POA: Diagnosis present

## 2015-06-23 DIAGNOSIS — R339 Retention of urine, unspecified: Secondary | ICD-10-CM | POA: Insufficient documentation

## 2015-06-23 HISTORY — DX: Unspecified hearing loss, unspecified ear: H91.90

## 2015-06-23 HISTORY — DX: Unspecified cataract: H26.9

## 2015-06-23 HISTORY — DX: Anemia, unspecified: D64.9

## 2015-06-23 LAB — CBC
HEMATOCRIT: 38.5 % — AB (ref 39.0–52.0)
Hemoglobin: 11.7 g/dL — ABNORMAL LOW (ref 13.0–17.0)
MCH: 29.8 pg (ref 26.0–34.0)
MCHC: 30.4 g/dL (ref 30.0–36.0)
MCV: 98 fL (ref 78.0–100.0)
Platelets: 241 10*3/uL (ref 150–400)
RBC: 3.93 MIL/uL — ABNORMAL LOW (ref 4.22–5.81)
RDW: 16.5 % — AB (ref 11.5–15.5)
WBC: 8.8 10*3/uL (ref 4.0–10.5)

## 2015-06-23 LAB — BASIC METABOLIC PANEL
Anion gap: 9 (ref 5–15)
BUN: 34 mg/dL — ABNORMAL HIGH (ref 6–20)
CALCIUM: 9.8 mg/dL (ref 8.9–10.3)
CO2: 27 mmol/L (ref 22–32)
CREATININE: 1.44 mg/dL — AB (ref 0.61–1.24)
Chloride: 104 mmol/L (ref 101–111)
GFR calc non Af Amer: 40 mL/min — ABNORMAL LOW (ref >60)
GFR, EST AFRICAN AMERICAN: 47 mL/min — AB (ref >60)
GLUCOSE: 140 mg/dL — AB (ref 65–99)
Potassium: 5.4 mmol/L — ABNORMAL HIGH (ref 3.5–5.1)
Sodium: 140 mmol/L (ref 135–145)

## 2015-06-23 NOTE — Patient Instructions (Addendum)
Clayton [name]  06/23/2015   Your procedure is scheduled on: Friday 06/30/15  Report to [facility] Main  Entrance take [facility]  elevators to 3rd floor to  Short Stay Center at 8:30 AM.  Call this number if you have problems the morning of surgery [phone]   Remember: ONLY 1 PERSON MAY GO WITH YOU TO SHORT STAY TO GET  READY MORNING OF YOUR SURGERY.  Do not eat food or drink liquids :After Midnight.  Have a healthy snack before bed the night before surgery.   Take these medicines the morning of surgery with A SIP OF WATER: None DO NOT TAKE ANY DIABETIC MEDICATIONS DAY OF YOUR SURGERY                               You may not have any metal on your body including hair pins and              piercings  Do not wear jewelry, make-up, lotions, powders or perfumes, deodorant             Do not wear nail polish.  Do not shave  48 hours prior to surgery.              Men may shave face and neck.   Do not bring valuables to the hospital. [facility] IS NOT             RESPONSIBLE   FOR VALUABLES.  Contacts, dentures or bridgework may not be worn into surgery.  Leave suitcase in the car. After surgery it may be brought to your room.   Special Instructions: N/A              Please read over the following fact sheets you were given: _____________________________________________________________________             [facility] - Preparing for Surgery Before surgery, you can play an important role.  Because skin is not sterile, your skin needs to be as free of germs as possible.  You can reduce the number of germs on your skin by washing with CHG (chlorahexidine gluconate) soap before surgery.  CHG is an antiseptic cleaner which kills germs and bonds with the skin to continue killing germs even after washing. Please DO NOT use if you have an allergy to CHG or antibacterial soaps.  If your skin becomes reddened/irritated stop using the CHG and inform your nurse when you  arrive at Short Stay. Do not shave (including legs and underarms) for at least 48 hours prior to the first CHG shower.  You may shave your face/neck. Please follow these instructions carefully:  1.  Shower with CHG Soap the night before surgery and the  morning of Surgery.  2.  If you choose to wash your hair, wash your hair first as usual with your  normal  shampoo.  3.  After you shampoo, rinse your hair and body thoroughly to remove the  shampoo.                           4.  Use CHG as you would any other liquid soap.  You can apply chg directly  to the skin and wash  Gently with a scrungie or clean washcloth.  5.  Apply the CHG Soap to your body ONLY FROM THE NECK DOWN.   Do not use on face/ open                           Wound or open sores. Avoid contact with eyes, ears mouth and genitals (private parts).                       Wash face,  Genitals (private parts) with your normal soap.             6.  Wash thoroughly, paying special attention to the area where your surgery  will be performed.  7.  Thoroughly rinse your body with warm water from the neck down.  8.  DO NOT shower/wash with your normal soap after using and rinsing off  the CHG Soap.                9.  Pat yourself dry with a clean towel.            10.  Wear clean pajamas.            11.  Place clean sheets on your bed the night of your first shower and do not  sleep with pets. Day of Surgery : Do not apply any lotions/deodorants the morning of surgery.  Please wear clean clothes to the hospital/surgery center.  FAILURE TO FOLLOW THESE INSTRUCTIONS MAY RESULT IN THE CANCELLATION OF YOUR SURGERY PATIENT SIGNATURE_________________________________  NURSE SIGNATURE__________________________________  ________________________________________________________________________

## 2015-06-23 NOTE — Pre-Procedure Instructions (Signed)
05-23-15 EKG, epic 05-22-15 2V CXR, epic 02-08-15 Echo and EEG, epic Medical clearance on chart

## 2015-06-24 LAB — HEMOGLOBIN A1C
Hgb A1c MFr Bld: 6.4 % — ABNORMAL HIGH (ref 4.8–5.6)
MEAN PLASMA GLUCOSE: 137 mg/dL

## 2015-06-26 LAB — URINE CULTURE: Culture: 20000

## 2015-06-26 NOTE — Progress Notes (Addendum)
Abnormal UA culture faxed to Dr.Herrick Abnormal BMET faxed to Dr.Herrick

## 2015-06-27 ENCOUNTER — Encounter: Payer: Medicare Other | Admitting: Speech Pathology

## 2015-06-29 ENCOUNTER — Encounter: Payer: Medicare Other | Admitting: Speech Pathology

## 2015-06-29 MED ORDER — GENTAMICIN SULFATE 40 MG/ML IJ SOLN
280.0000 mg | INTRAVENOUS | Status: AC
  Administered 2015-06-30: 280 mg via INTRAVENOUS
  Filled 2015-06-29: qty 7

## 2015-06-30 ENCOUNTER — Ambulatory Visit (HOSPITAL_COMMUNITY): Payer: Medicare (Managed Care) | Admitting: Certified Registered"

## 2015-06-30 ENCOUNTER — Encounter (HOSPITAL_COMMUNITY): Payer: Self-pay | Admitting: *Deleted

## 2015-06-30 ENCOUNTER — Observation Stay (HOSPITAL_COMMUNITY)
Admission: RE | Admit: 2015-06-30 | Discharge: 2015-07-01 | Disposition: A | Discharge status: Home / Self Care | Payer: Medicare (Managed Care) | Source: Ambulatory Visit | Attending: Urology | Admitting: Urology

## 2015-06-30 ENCOUNTER — Encounter (HOSPITAL_COMMUNITY)
Admission: RE | Disposition: A | Discharge status: Home / Self Care | Payer: Self-pay | Source: Ambulatory Visit | Attending: Urology

## 2015-06-30 DIAGNOSIS — T83198A Other mechanical complication of other urinary devices and implants, initial encounter: Secondary | ICD-10-CM | POA: Insufficient documentation

## 2015-06-30 DIAGNOSIS — Z7984 Long term (current) use of oral hypoglycemic drugs: Secondary | ICD-10-CM | POA: Diagnosis not present

## 2015-06-30 DIAGNOSIS — E1122 Type 2 diabetes mellitus with diabetic chronic kidney disease: Secondary | ICD-10-CM | POA: Diagnosis not present

## 2015-06-30 DIAGNOSIS — I480 Paroxysmal atrial fibrillation: Secondary | ICD-10-CM | POA: Insufficient documentation

## 2015-06-30 DIAGNOSIS — M109 Gout, unspecified: Secondary | ICD-10-CM | POA: Diagnosis not present

## 2015-06-30 DIAGNOSIS — Z791 Long term (current) use of non-steroidal anti-inflammatories (NSAID): Secondary | ICD-10-CM | POA: Insufficient documentation

## 2015-06-30 DIAGNOSIS — H3530 Unspecified macular degeneration: Secondary | ICD-10-CM | POA: Insufficient documentation

## 2015-06-30 DIAGNOSIS — N401 Enlarged prostate with lower urinary tract symptoms: Principal | ICD-10-CM | POA: Insufficient documentation

## 2015-06-30 DIAGNOSIS — Z79899 Other long term (current) drug therapy: Secondary | ICD-10-CM | POA: Insufficient documentation

## 2015-06-30 DIAGNOSIS — R338 Other retention of urine: Secondary | ICD-10-CM | POA: Insufficient documentation

## 2015-06-30 DIAGNOSIS — I4510 Unspecified right bundle-branch block: Secondary | ICD-10-CM | POA: Insufficient documentation

## 2015-06-30 DIAGNOSIS — I129 Hypertensive chronic kidney disease with stage 1 through stage 4 chronic kidney disease, or unspecified chronic kidney disease: Secondary | ICD-10-CM | POA: Diagnosis not present

## 2015-06-30 DIAGNOSIS — N138 Other obstructive and reflux uropathy: Secondary | ICD-10-CM | POA: Diagnosis not present

## 2015-06-30 DIAGNOSIS — R339 Retention of urine, unspecified: Secondary | ICD-10-CM | POA: Diagnosis present

## 2015-06-30 DIAGNOSIS — Z87891 Personal history of nicotine dependence: Secondary | ICD-10-CM | POA: Diagnosis not present

## 2015-06-30 DIAGNOSIS — N183 Chronic kidney disease, stage 3 (moderate): Secondary | ICD-10-CM | POA: Diagnosis not present

## 2015-06-30 HISTORY — PX: TRANSURETHRAL RESECTION OF PROSTATE: SHX73

## 2015-06-30 LAB — CBC
HCT: 35.2 % — ABNORMAL LOW (ref 39.0–52.0)
Hemoglobin: 11.2 g/dL — ABNORMAL LOW (ref 13.0–17.0)
MCH: 30.9 pg (ref 26.0–34.0)
MCHC: 31.8 g/dL (ref 30.0–36.0)
MCV: 97 fL (ref 78.0–100.0)
Platelets: 203 10*3/uL (ref 150–400)
RBC: 3.63 MIL/uL — AB (ref 4.22–5.81)
RDW: 16.6 % — AB (ref 11.5–15.5)
WBC: 12.2 10*3/uL — AB (ref 4.0–10.5)

## 2015-06-30 LAB — BASIC METABOLIC PANEL
Anion gap: 7 (ref 5–15)
BUN: 26 mg/dL — AB (ref 6–20)
CALCIUM: 8.5 mg/dL — AB (ref 8.9–10.3)
CO2: 25 mmol/L (ref 22–32)
CREATININE: 1.46 mg/dL — AB (ref 0.61–1.24)
Chloride: 105 mmol/L (ref 101–111)
GFR calc non Af Amer: 40 mL/min — ABNORMAL LOW (ref >60)
GFR, EST AFRICAN AMERICAN: 46 mL/min — AB (ref >60)
Glucose, Bld: 150 mg/dL — ABNORMAL HIGH (ref 65–99)
Potassium: 5 mmol/L (ref 3.5–5.1)
SODIUM: 137 mmol/L (ref 135–145)

## 2015-06-30 LAB — GLUCOSE, CAPILLARY
GLUCOSE-CAPILLARY: 127 mg/dL — AB (ref 65–99)
GLUCOSE-CAPILLARY: 127 mg/dL — AB (ref 65–99)

## 2015-06-30 SURGERY — TURP (TRANSURETHRAL RESECTION OF PROSTATE)
Anesthesia: General

## 2015-06-30 MED ORDER — TRAMADOL HCL 50 MG PO TABS
50.0000 mg | ORAL_TABLET | Freq: Three times a day (TID) | ORAL | Status: DC | PRN
  Administered 2015-06-30 – 2015-07-01 (×2): 50 mg via ORAL
  Filled 2015-06-30 (×2): qty 1

## 2015-06-30 MED ORDER — LACTATED RINGERS IV SOLN
INTRAVENOUS | Status: DC | PRN
  Administered 2015-06-30: 10:00:00 via INTRAVENOUS

## 2015-06-30 MED ORDER — PROPOFOL 10 MG/ML IV BOLUS
INTRAVENOUS | Status: AC
  Filled 2015-06-30: qty 20

## 2015-06-30 MED ORDER — CIPROFLOXACIN IN D5W 400 MG/200ML IV SOLN
400.0000 mg | INTRAVENOUS | Status: AC
  Administered 2015-06-30: 400 mg via INTRAVENOUS

## 2015-06-30 MED ORDER — HYDRALAZINE HCL 20 MG/ML IJ SOLN: 5.0000 mg | INTRAMUSCULAR | Status: DC | PRN

## 2015-06-30 MED ORDER — METFORMIN HCL ER 500 MG PO TB24: 500.0000 mg | ORAL_TABLET | Freq: Every day | ORAL | Status: DC

## 2015-06-30 MED ORDER — PROPOFOL 10 MG/ML IV BOLUS
INTRAVENOUS | Status: DC | PRN
  Administered 2015-06-30: 50 mg via INTRAVENOUS
  Administered 2015-06-30: 100 mg via INTRAVENOUS

## 2015-06-30 MED ORDER — DOCUSATE SODIUM 100 MG PO CAPS: 100.0000 mg | ORAL_CAPSULE | Freq: Two times a day (BID) | ORAL | Status: DC | PRN

## 2015-06-30 MED ORDER — LIDOCAINE HCL (CARDIAC) 20 MG/ML IV SOLN
INTRAVENOUS | Status: AC
  Filled 2015-06-30: qty 5

## 2015-06-30 MED ORDER — SODIUM CHLORIDE 0.45 % IV SOLN
INTRAVENOUS | Status: DC
  Administered 2015-06-30 (×2): via INTRAVENOUS

## 2015-06-30 MED ORDER — HYDROCODONE-ACETAMINOPHEN 7.5-325 MG PO TABS: 1.0000 | ORAL_TABLET | Freq: Once | ORAL | Status: DC | PRN

## 2015-06-30 MED ORDER — CIPROFLOXACIN IN D5W 400 MG/200ML IV SOLN
INTRAVENOUS | Status: AC
  Filled 2015-06-30: qty 200

## 2015-06-30 MED ORDER — ONDANSETRON HCL 4 MG/2ML IJ SOLN
INTRAMUSCULAR | Status: DC | PRN
  Administered 2015-06-30: 4 mg via INTRAVENOUS

## 2015-06-30 MED ORDER — FENTANYL CITRATE (PF) 100 MCG/2ML IJ SOLN
INTRAMUSCULAR | Status: AC
  Filled 2015-06-30: qty 2

## 2015-06-30 MED ORDER — FENTANYL CITRATE (PF) 100 MCG/2ML IJ SOLN
INTRAMUSCULAR | Status: DC | PRN
  Administered 2015-06-30 (×2): 25 ug via INTRAVENOUS

## 2015-06-30 MED ORDER — ACETAMINOPHEN 10 MG/ML IV SOLN
1000.0000 mg | Freq: Four times a day (QID) | INTRAVENOUS | Status: AC
  Administered 2015-06-30 – 2015-07-01 (×4): 1000 mg via INTRAVENOUS
  Filled 2015-06-30 (×5): qty 100

## 2015-06-30 MED ORDER — CIPROFLOXACIN HCL 250 MG PO TABS
250.0000 mg | ORAL_TABLET | Freq: Two times a day (BID) | ORAL | Status: DC
  Administered 2015-06-30 – 2015-07-01 (×2): 250 mg via ORAL
  Filled 2015-06-30 (×2): qty 1

## 2015-06-30 MED ORDER — LIDOCAINE HCL (CARDIAC) 20 MG/ML IV SOLN
INTRAVENOUS | Status: DC | PRN
  Administered 2015-06-30: 60 mg via INTRATRACHEAL

## 2015-06-30 MED ORDER — TRAMADOL HCL 50 MG PO TABS: 50.0000 mg | ORAL_TABLET | Freq: Three times a day (TID) | ORAL | Status: DC | PRN

## 2015-06-30 MED ORDER — ONDANSETRON HCL 4 MG/2ML IJ SOLN: 4.0000 mg | Freq: Once | INTRAMUSCULAR | Status: DC | PRN

## 2015-06-30 MED ORDER — 0.9 % SODIUM CHLORIDE (POUR BTL) OPTIME
TOPICAL | Status: DC | PRN
  Administered 2015-06-30: 1000 mL

## 2015-06-30 MED ORDER — SODIUM CHLORIDE 0.9 % IR SOLN
Status: DC | PRN
  Administered 2015-06-30: 16000 mL

## 2015-06-30 MED ORDER — ONDANSETRON HCL 4 MG/2ML IJ SOLN
INTRAMUSCULAR | Status: AC
  Filled 2015-06-30: qty 2

## 2015-06-30 MED ORDER — PHENYLEPHRINE 40 MCG/ML (10ML) SYRINGE FOR IV PUSH (FOR BLOOD PRESSURE SUPPORT)
PREFILLED_SYRINGE | INTRAVENOUS | Status: AC
  Filled 2015-06-30: qty 10

## 2015-06-30 MED ORDER — DEXAMETHASONE SODIUM PHOSPHATE 10 MG/ML IJ SOLN
INTRAMUSCULAR | Status: DC | PRN
  Administered 2015-06-30: 10 mg via INTRAVENOUS

## 2015-06-30 MED ORDER — HYDROMORPHONE HCL 1 MG/ML IJ SOLN: 0.2500 mg | INTRAMUSCULAR | Status: DC | PRN

## 2015-06-30 MED ORDER — STERILE WATER FOR IRRIGATION IR SOLN
Status: DC | PRN
  Administered 2015-06-30: 500 mL

## 2015-06-30 MED ORDER — DEXAMETHASONE SODIUM PHOSPHATE 10 MG/ML IJ SOLN
INTRAMUSCULAR | Status: AC
  Filled 2015-06-30: qty 1

## 2015-06-30 MED ORDER — AMLODIPINE BESYLATE 5 MG PO TABS
2.5000 mg | ORAL_TABLET | Freq: Every day | ORAL | Status: DC
  Administered 2015-06-30: 2.5 mg via ORAL
  Filled 2015-06-30: qty 1

## 2015-06-30 SURGICAL SUPPLY — 27 items
BAG URINE DRAINAGE (UROLOGICAL SUPPLIES) ×3 IMPLANT
BAG URO CATCHER STRL LF (MISCELLANEOUS) ×3 IMPLANT
CATH FOLEY 2WAY SLVR 30CC 24FR (CATHETERS) IMPLANT
CATH FOLEY 3WAY 30CC 22FR (CATHETERS) ×3 IMPLANT
CATH FOLEY 3WAY 30CC 24FR (CATHETERS)
CATH URTH STD 24FR FL 3W 2 (CATHETERS) IMPLANT
ELECT REM PT RETURN 9FT ADLT (ELECTROSURGICAL) ×3
ELECTRODE REM PT RTRN 9FT ADLT (ELECTROSURGICAL) ×1 IMPLANT
EVACUATOR ELLICK (MISCELLANEOUS) ×3 IMPLANT
EVACUATOR MICROVAS BLADDER (UROLOGICAL SUPPLIES) ×3 IMPLANT
GLOVE BIO SURGEON STRL SZ7.5 (GLOVE) ×3 IMPLANT
GLOVE BIOGEL PI IND STRL 7.5 (GLOVE) ×1 IMPLANT
GLOVE BIOGEL PI INDICATOR 7.5 (GLOVE) ×2
GLOVE SURG SS PI 7.5 STRL IVOR (GLOVE) ×3 IMPLANT
GOWN STRL REUS W/ TWL LRG LVL3 (GOWN DISPOSABLE) ×1 IMPLANT
GOWN STRL REUS W/TWL LRG LVL3 (GOWN DISPOSABLE) ×2
GOWN STRL REUS W/TWL XL LVL3 (GOWN DISPOSABLE) ×3 IMPLANT
HOLDER FOLEY CATH W/STRAP (MISCELLANEOUS) ×3 IMPLANT
KIT ASPIRATION TUBING (SET/KITS/TRAYS/PACK) ×3 IMPLANT
LOOP CUT BIPOLAR 24F LRG (ELECTROSURGICAL) ×3 IMPLANT
MANIFOLD NEPTUNE II (INSTRUMENTS) ×3 IMPLANT
PACK CYSTO (CUSTOM PROCEDURE TRAY) ×3 IMPLANT
SYR 30ML LL (SYRINGE) ×3 IMPLANT
SYRINGE 20CC LL (MISCELLANEOUS) ×3 IMPLANT
SYRINGE IRR TOOMEY STRL 70CC (SYRINGE) ×3 IMPLANT
TUBING CONNECTING 10 (TUBING) ×2 IMPLANT
TUBING CONNECTING 10' (TUBING) ×1

## 2015-06-30 NOTE — Transfer of Care (Signed)
Immediate Anesthesia Transfer of Care Note  Patient: Clayton [name]  Procedure(s) Performed: Procedure(s): TRANSURETHRAL RESECTION OF THE PROSTATE (TURP) (N/A)  Patient Location: PACU  Anesthesia Type:General  Level of Consciousness: awake and patient cooperative  Airway & Oxygen Therapy: Patient Spontanous Breathing and Patient connected to face mask oxygen  Post-op Assessment: Report given to RN and Post -op Vital signs reviewed and stable  Post vital signs: Reviewed and stable  Last Vitals:  Filed Vitals:   06/30/15 0821  BP: 137/80  Pulse: 92  Temp: 36.6 C  Resp: 16    Complications: No apparent anesthesia complications

## 2015-06-30 NOTE — Interval H&P Note (Signed)
History and Physical Interval Note:  06/30/2015 10:31 AM  Clayton [name]  has presented today for surgery, with the diagnosis of URINARY RETENTION  The various methods of treatment have been discussed with the patient and family. After consideration of risks, benefits and other options for treatment, the patient has consented to  Procedure(s): TRANSURETHRAL RESECTION OF THE PROSTATE (TURP) (N/A) as a surgical intervention .  The patient's history has been reviewed, patient examined, no change in status, stable for surgery.  I have reviewed the patient's chart and labs.  Questions were answered to the patient's satisfaction.     Berniece Salines W

## 2015-06-30 NOTE — Op Note (Signed)
Preoperative diagnosis:  1. Bladder outlet obstruction   Postoperative diagnosis:  1. Same 2. Urinary retention   Procedure: 1. TURP  Surgeon: Crist Fat, MD  Anesthesia: General  Complications: None  Intraoperative findings:  1. Short prostatic urethra 2. Obstructing right lateral lobe  EBL: Minimal  Specimens: prostate chips  Indication: Clayton [name] is a 82 y.o. patient with urinary retention.  After reviewing the management options for treatment, he elected to proceed with the above surgical procedure(s). We have discussed the potential benefits and risks of the procedure, side effects of the proposed treatment, the likelihood of the patient achieving the goals of the procedure, and any potential problems that might occur during the procedure or recuperation. Informed consent has been obtained.  Description of procedure:  The patient was taken to the operating room and general anesthesia was induced.  The patient was placed in the dorsal lithotomy position, prepped and draped in the usual sterile fashion, and preoperative antibiotics were administered. A preoperative time-out was performed.   A 21 French 30 cystoscope was then gently passed to the patient's urethra and his bladder. I then performed cystoscopy demonstrating an enlarged bladder with orthotopic ureteral orifice ease. His prostate was noted to be significantly shortened with mildly obstructing left lateral lobe and a high bladder neck. I then removed the 21 French cystoscope and gently passed a 26 French resectoscope sheath using the visual obturator. I then exchanged the obturator for the loop resectoscope. I then started with the right lateral lobe at the 7:00 position working up to approximately 11:00 resecting down to the capsule. Then performed a similar technique on the left side starting at 5:00 and going up to approximately 1:00. I then resected the floor of the prostate down to capsule. I then went  up to the anterior surface and resected some of the anterior prostate. I then cauterized the arterial bleeders and removed all the prostate chips using an Ellik. I then reinserted the scope to ensure that there is no significant arterial bleeding. I then passed a 67 [place] through a Foley catheter into the patient's urethra into the bladder using a stylette. 30 mL of sterile water was injected into the balloon of the catheter. The patient was subsequently put on CBI and returned the PACU in stable condition.  Crist Fat, M.D.

## 2015-06-30 NOTE — Progress Notes (Signed)
PHARMACIST - PHYSICIAN COMMUNICATION DR:  Louis Meckel CONCERNING:  METFORMIN SAFE ADMINISTRATION POLICY  RECOMMENDATION: Metformin has been placed on DISCONTINUE (rejected order) STATUS and should be reordered only after any of the conditions below are ruled out.  Current Safety recommendations include avoiding metformin for a minimum of 48 hours after the patient's exposure to intravenous contrast media for the following conditions:  . eGFR < 60 ml/min  . Liver disease, alcoholism, heart failure, intra-arterial administration of contrast  DESCRIPTION:  The Pharmacy Committee has adopted a policy that restricts the use of metformin in hospitalized patients until all the contraindications to administration have been ruled out. Specific contraindications are: '[x]'$  eGFR < 30 ml/min/1.73 m2 '[]'$  Shock, acute MI, sepsis, hypoxemia, dehydration '[]'$  Planned administration of intravenous iodinated contrast media when eGFR < 70m/min, Liver Disease, alcoholism, heart failure or intra-arterial administration of contrast '[]'$  Heart Failure patients with low EF '[]'$  Acute or chronic metabolic acidosis (including DKA)   EPeggyann Juba PharmD, BCPS Pager: [phone] 06/30/2015 1:36 PM

## 2015-06-30 NOTE — Progress Notes (Signed)
Urology Post-op Note URINARY INSTENTION Day of Surgery  Tolerated surgery well. Transferred to 4W without issue No pain/complaints.  Slightly confused.  Past Medical History  Diagnosis Date  . Macular degeneration   . Hypertension   . Diabetes mellitus without complication (HCC)   . CKD (chronic kidney disease), stage III   . Kidney stones   . Distended bladder   . Hiatal hernia   . Diverticulosis     on scan  . Syncopal episodes   . paroxysmal afib     right bundle branch block  . Anemia   . Cataracts, bilateral   . Hard of hearing    Current Facility-Administered MedicationCrist Fatose Route Frequency Provider Last Rate Last Dose  . 0.45 % sodium chloride infusion   Intravenous Continuous Joni Norrod W Aijah Lattner, MD 100 mL/hr at 06/30/15 1258    . acetaminophen (OFIRMEV) IV 1,000 mg  1,000 mg Intravenous 4 times per day Crist Fat, MD   1,000 mg at 06/30/15 1454  . amLODipine (NORVASC) tablet 2.5 mg  2.5 mg Oral QHS Crist Fat, MD      . ciprofloxacin (CIPRO) tablet 250 mg  250 mg Oral BID Crist Fat, MD      . hydrALAZINE (APRESOLINE) injection 5 mg  5 mg Intravenous Q4H PRN Crist Fat, MD      . traMADol Janean Sark) tablet 50 mg  50 mg Oral TID PRN Crist Fat, MD         Objective: Vital: Filed Vitals:   06/30/15 1230 06/30/15 1245 06/30/15 1300 06/30/15 1319  BP: 155/73 152/68  147/72  Pulse: 67 70 67 69  Temp:  97.7 F (36.5 C)  97.6 F (36.4 C)  TempSrc:      Resp: Height:      Weight:      SpO2: 100% 100% 100% 100%   I/Os:    Physical Exam:  General: Patient is in no apparent distress Lungs: Normal respiratory effort, chest expands symmetrically. GI: The abdomen is soft and nontender without mass. Foley draining clear efflux on light drip Ext: lower extremities symmetric  Lab Results:  Recent Labs  06/30/15 1238  WBC 12.2*  HGB 11.2*  HCT 35.2*    Recent Labs  06/30/15 1238  NA 137   K 5.0  CL 105  CO2 25  GLUCOSE 150*  BUN 26*  CREATININE 1.46*  CALCIUM 8.5*   No results for input(s): LABPT, INR in the last 72 hours. No results for input(s): LABURIN in the last 72 hours. Results for orders placed or performed during the hospital encounter of 06/23/15  Urine culture     Status: None   Collection Time: 06/23/15  2:36 PM  Result Value Ref Range Status   Specimen Description URINE, RANDOM  Final   Special Requests NONE  Final   Culture   Final    20,000 COLONIES/mL ENTEROCOCCUS SPECIES Performed at [facility]    Report Status 06/26/2015 FINAL  Final   Organism ID, Bacteria ENTEROCOCCUS SPECIES  Final      Susceptibility   Enterococcus species - MIC*    AMPICILLIN <=2 SENSITIVE Sensitive     LEVOFLOXACIN 1 SENSITIVE Sensitive     NITROFURANTOIN <=16 SENSITIVE Sensitive     VANCOMYCIN 1 SENSITIVE Sensitive     * 20,000 COLONIES/mL ENTEROCOCCUS SPECIES    Studies/Results: No results found.  Assessment: Day of Surgery Urine clear on slow CBI  drip. Stable, baseline confusion Plan: D/c foley at 6am.  Voiding trial.  Low threshold for replacing catheter.  Would use coude catheter as there was mild undermining of the bladder neck during his TURP. Continue Cipro as prescribed Routine post-op care.    Crist Fat 06/30/2015, 5:38 PM

## 2015-06-30 NOTE — Discharge Instructions (Signed)

## 2015-06-30 NOTE — Anesthesia Procedure Notes (Addendum)
Procedure Name: Intubation Date/Time: 06/30/2015 10:42 AM Performed by: Delphia Grates Pre-anesthesia Checklist: Emergency Drugs available, Suction available, Patient being monitored and Patient identified Patient Re-evaluated:Patient Re-evaluated prior to inductionOxygen Delivery Method: Circle system utilized Preoxygenation: Pre-oxygenation with 100% oxygen Intubation Type: IV induction Ventilation: Mask ventilation without difficulty Laryngoscope Size: Mac and 4 Grade View: Grade II Tube type: Oral Tube size: 7.5 mm Number of attempts: 1 Airway Equipment and Method: Stylet Secured at: 25 cm Tube secured with: Tape Dental Injury: Teeth and Oropharynx as per pre-operative assessment  Comments: Tubed without Sux per B. Judd(K 5.4)

## 2015-06-30 NOTE — H&P (Signed)
Reason For Visit The catheter bag troubles   History of Present Illness Patient urinary retention first developed on 03/06/15. He was seen in the emergency department where a Foley catheter was placed in 1700 cc of urine drained. At that time, the patient's kidney function was unremarkable and a CT scan demonstrated atrophic kidneys. There was no hydronephrosis.    Baseline voiding symptoms prior to this current episode included progressive urinary frequency and incomplete bladder emptying. Patient has no history of recurrent urinary tract infections.    The patient was discharged from the hospital was started on tamsulosin. He was severely constipated. Our initial follow-up voiding trial was performed on 03/20/15, the patient was unable to void. A Foley catheter was replaced and the patient requested to come back today for a second voiding trial which he failed. The patient has been tolerating his Foley catheter well. There is been no hematuria. There is no leakage around the catheter. The patient is now having regular bowel movements. He continues on tamsulosin 0.4 mg.    Cystoscopy showed The patient has a large diverticulum at the posterior bladder wall/dome. He also has a obstructing prostate with kissing lateral lobes and a high median bar.     Interval: The patient presents today, one day after he was seen for acute urinary retention. He is having issues with his catheter bag. It is now leaking. He is hoping to get a replacement bag today. I saw the patient because yesterday we discussed treatment options for his urinary retention. The patient is opted to proceed with channel TURP.   Past Medical History Problems  1. History of Dysphagia (R13.10) 2. History of Gout (M10.9) 3. History of diabetes mellitus (Z86.39) 4. History of esophageal reflux (Z87.19) 5. History of hiatal hernia (Z87.19) 6. History of hypertension (Z86.79) 7. History of Macular degeneration (H35.30)  Surgical  History Problems  1. History of Hernia Repair 2. History of Needle Biopsy Of Prostate 3. History of Percutaneous Vertebral Augmentation Kyphoplasty 4. History of Shoulder Surgery  Current Meds 1. Aleve TABS;  Therapy: (Recorded:08Nov2016) to Recorded 2. Ferrous Gluconate 324 MG TABS;  Therapy: (Recorded:08Nov2016) to Recorded 3. Lisinopril 10 MG Oral Tablet;  Therapy: (Recorded:08Nov2016) to Recorded 4. MetFORMIN HCl - 1000 MG Oral Tablet;  Therapy: (Recorded:08Nov2016) to Recorded 5. MiraLax Oral Powder (Polyethylene Glycol 3350);  Therapy: (Recorded:08Nov2016) to Recorded 6. Tamsulosin HCl - 0.4 MG Oral Capsule; TAKE 1 CAPSULE Daily;  Therapy: 08Nov2016 to (Evaluate:07Jan2017)  Requested for: 08Nov2016; Last  Rx:08Nov2016 Ordered  Allergies Medication  1. Sulfa Antibiotics 2. Sulfa Drugs  Family History Problems  1. Family history of Family Health Status Number Of Children   2 SONS, 1 DAUGHTER 2. Family history of macular degeneration (Z61.096) : Mother, Sister  Social History Problems  1. Denied: Alcohol Use (History) 2. Caffeine Use   4 3. Family history of Death In The Family Father   AGE 31 4. Family history of Death In The Family Mother   AGE 47 5. Former smoker [phone]) 6. Marital History - Currently Married 7. Number of children   2 sons and 1 daughter 30. Occupation:   RETIRED 9. Tobacco Use   SMOKED PIPE FOR 5 YRS - QUIT 30 YRS  Physical Exam The patient is in no acute distress, he is dyspneic  Alert and oriented  The patient has a soft ejection murmur, he has a regular heart rate with occasional skipped beats, the rate is normal  The patient has diminished air movement. His lungs  are otherwise clear to auscultation bilaterally.     Assessment Assessed  1. Acute urinary retention (R33.8)  Discussion/Summary The patient was given a replacement urinary bag today.  After more discussion, the patient has opted for a channel TURP. This  would require overnight hospitalization. This is a quick procedure to open the channel and gives a chance of the catheter. The patient understands that this may fail, but it is his only chance at getting the catheter out. The patient is shooting for quality of life. He will need cardiac clearance. We will start with Dr. Juleen China.

## 2015-06-30 NOTE — Anesthesia Preprocedure Evaluation (Addendum)
Anesthesia Evaluation    Reviewed: Allergy & Precautions, H&P , NPO status , Patient's Chart, lab work & pertinent test results  History of Anesthesia Complications Negative for: history of anesthetic complications  Airway Mallampati: II  TM Distance: >3 FB Neck ROM: Full    Dental  (+) Missing, Poor Dentition   Pulmonary neg pulmonary ROS, former smoker,    Pulmonary exam normal breath sounds clear to auscultation       Cardiovascular hypertension, Normal cardiovascular exam+ dysrhythmias  Rhythm:Regular Rate:Normal  Echo 2016 with normal EF, mild to moderate MR, no AS   Neuro/Psych    GI/Hepatic negative GI ROS, Neg liver ROS,   Endo/Other  diabetes, Oral Hypoglycemic Agents  Renal/GU negative Renal ROS     Musculoskeletal   Abdominal   Peds  Hematology   Anesthesia Other Findings   Reproductive/Obstetrics                            Anesthesia Physical  Anesthesia Plan  ASA: III  Anesthesia Plan: General   Post-op Pain Management:    Induction: Intravenous  Airway Management Planned: Oral ETT  Additional Equipment:   Intra-op Plan:   Post-operative Plan: Extubation in OR  Informed Consent: I have reviewed the patients History and Physical, chart, labs and discussed the procedure including the risks, benefits and alternatives for the proposed anesthesia with the patient or authorized representative who has indicated his/her understanding and acceptance.     Plan Discussed with: CRNA, Anesthesiologist and Surgeon  Anesthesia Plan Comments:        Anesthesia Quick Evaluation

## 2015-06-30 NOTE — Anesthesia Postprocedure Evaluation (Signed)
Anesthesia Post Note  Patient: Clayton [name]  Procedure(s) Performed: Procedure(s) (LRB): TRANSURETHRAL RESECTION OF THE PROSTATE (TURP) (N/A)  Patient location during evaluation: PACU Anesthesia Type: General Level of consciousness: awake and alert Pain management: pain level controlled Vital Signs Assessment: post-procedure vital signs reviewed and stable Respiratory status: spontaneous breathing, nonlabored ventilation, respiratory function stable and patient connected to nasal cannula oxygen Cardiovascular status: blood pressure returned to baseline and stable Postop Assessment: no signs of nausea or vomiting Anesthetic complications: no    Last Vitals:  Filed Vitals:   06/30/15 1300 06/30/15 1319  BP:  147/72  Pulse: 67 69  Temp:  36.4 C  Resp: 19 16    Last Pain:  Filed Vitals:   06/30/15 1321  PainSc: 0-No pain                 Reino Kent

## 2015-07-01 DIAGNOSIS — N401 Enlarged prostate with lower urinary tract symptoms: Secondary | ICD-10-CM | POA: Diagnosis not present

## 2015-07-01 NOTE — Evaluation (Signed)
Physical Therapy Evaluation Patient Details Name: Clayton [name] MRN: [id number] DOB: [birth year] Today's Date: 07/01/2015   History of Present Illness  82 yo male  presents 06/30/15 for acute urinary retention. He has had a catheter since 03/05/16 and is having issues with his catheter bag. It is now leaking.  The patient is opted to proceed with channel TURP performed 06/30/15  Clinical Impression  The patient ambulated x 100' with rollator and min guard. Family declines HHPT at thjis time. Pt admitted with above diagnosis. Pt currently with functional limitations due to the deficits listed below (see PT Problem List). Pt will benefit from skilled PT to increase their independence and safety with mobility to allow discharge to home.       Follow Up Recommendations No PT follow up (daughter states  no needs at present, will let MD know if changes their mind/)    Equipment Recommendations  None recommended by PT    Recommendations for Other Services       Precautions / Restrictions Precautions Precautions: Fall Precaution Comments: macular degen      Mobility  Bed Mobility               General bed mobility comments: in recliner  Transfers Overall transfer level: Needs assistance Equipment used: 4-wheeled walker Transfers: Sit to/from Stand Sit to Stand: Min guard            Ambulation/Gait Ambulation/Gait assistance: Min guard Ambulation Distance (Feet): 100 Feet Assistive device: 4-wheeled walker Gait Pattern/deviations: Shuffle     General Gait Details: increased speed, cues to slow sopeed. condom cath slipped off so ambulation limited.  Stairs            Wheelchair Mobility    Modified Rankin (Stroke Patients Only)       Balance Overall balance assessment: History of Falls;Needs assistance Sitting-balance support: Feet supported;No upper extremity supported Sitting balance-Leahy Scale: Good     Standing balance support: During functional  activity;Bilateral upper extremity supported Standing balance-Leahy Scale: Poor                               Pertinent Vitals/Pain Pain Assessment: No/denies pain    Home Living Family/patient expects to be discharged to:: Private residence Living Arrangements: Spouse/significant other Available Help at Discharge: Family Type of Home: Apartment Home Access: Elevator     Home Layout: One level Home Equipment: Environmental consultant - 4 wheels      Prior Function Level of Independence: Needs assistance      ADL's / Homemaking Assistance Needed: for visual deficits  Comments: ambulates to dining hall with spouse using rollator for meals     Hand Dominance        Extremity/Trunk Assessment   Upper Extremity Assessment: Generalized weakness           Lower Extremity Assessment: Generalized weakness      Cervical / Trunk Assessment: Kyphotic  Communication   Communication: HOH  Cognition Arousal/Alertness: Awake/alert Behavior During Therapy: WFL for tasks assessed/performed Overall Cognitive Status: Within Functional Limits for tasks assessed                      General Comments      Exercises        Assessment/Plan    PT Assessment Patient needs continued PT services  PT Diagnosis Difficulty walking;Generalized weakness   PT Problem List Decreased strength;Decreased activity tolerance;Decreased balance;Decreased mobility  PT Treatment Interventions DME instruction;Gait training;Functional mobility training;Therapeutic activities;Balance training;Patient/family education   PT Goals (Current goals can be found in the Care Plan section) Acute Rehab PT Goals Patient Stated Goal: to go home today PT Goal Formulation: With patient/family Time For Goal Achievement: 07/15/15 Potential to Achieve Goals: Good    Frequency Min 3X/week   Barriers to discharge        Co-evaluation               End of Session Equipment Utilized During  Treatment: Gait belt Activity Tolerance: Patient tolerated treatment well Patient left: in bed;with nursing/sitter in room Nurse Communication: Mobility status    Functional Assessment Tool Used: clinical observation Functional Limitation: Mobility: Walking and moving around Mobility: Walking and Moving Around Current Status (G9562): At least 20 percent but less than 40 percent impaired, limited or restricted Mobility: Walking and Moving Around Goal Status [phone]): At least 1 percent but less than 20 percent impaired, limited or restricted    Time: 1202-1217 PT Time Calculation (min) (ACUTE ONLY): 15 min   Charges:   PT Evaluation $PT Eval Low Complexity: 1 Procedure     PT G Codes:   PT G-Codes **NOT FOR INPATIENT CLASS** Functional Assessment Tool Used: clinical observation Functional Limitation: Mobility: Walking and moving around Mobility: Walking and Moving Around Current Status (V7846): At least 20 percent but less than 40 percent impaired, limited or restricted Mobility: Walking and Moving Around Goal Status [phone]): At least 1 percent but less than 20 percent impaired, limited or restricted    Rada Hay 07/01/2015, 1:02 PM Blanchard Kelch PT [phone]

## 2015-07-01 NOTE — Discharge Summary (Signed)
Date of admission: 06/30/2015  Date of discharge: 07/01/2015  Admission diagnosis: Urinary retention  Discharge diagnosis: Urinary retention  Secondary diagnoses: Hypertension, diabetes  History and Physical: For full details, please see admission history and physical. Briefly, Clayton [name] is a 82 y.o. year old patient with urinary retention related to BPH/BOO.   Hospital Course: He underwent TURP on 06/30/15.  His postoperative course was uncomplicated.  His CBI was [name] to be titrated off and he passed a voiding trial. PVR was less than 200 cc after 3 voids and urine was clearing. After a discussion with his family, they wished for him to be discharged and understood they could use undergarments to manage urgency and incontinence if needed. They felt very comfortable with this plan.  Laboratory values:  Recent Labs  06/30/15 1238  HGB 11.2*  HCT 35.2*    Recent Labs  06/30/15 1238  CREATININE 1.46*    Disposition: Home  Discharge instruction: The patient was instructed to be ambulatory but told to refrain from heavy lifting, strenuous activity, or driving.   Discharge medications:    Medication List    STOP taking these medications        cephALEXin 500 MG capsule  Commonly known as:  KEFLEX      TAKE these medications        amLODipine 2.5 MG tablet  Commonly known as:  NORVASC  Take 2.5 mg by mouth at bedtime.     ciprofloxacin 250 MG tablet  Commonly known as:  CIPRO  Take 250 mg by mouth 2 (two) times daily.     docusate sodium 100 MG capsule  Commonly known as:  COLACE  Take 1 capsule (100 mg total) by mouth 2 (two) times daily as needed (take to keep stool soft.).     metFORMIN 500 MG 24 hr tablet  Commonly known as:  GLUCOPHAGE-XR  Take 500 mg by mouth daily with breakfast.     PRESCRIPTION MEDICATION  1 each every 8 (eight) weeks. Injection in right eye.     PRESCRIPTION MEDICATION  Iron injection given as needed.     traMADol 50 MG tablet   Commonly known as:  ULTRAM  Take 1 tablet (50 mg total) by mouth 3 (three) times daily as needed (for pain).        Followup:      Follow-up Information    Follow up with Crist Fat, MD On 07/13/2015.   Specialty:  Urology   Why:  1:30pm   Contact information:   [address] [place] [address] [phone]

## 2015-07-03 ENCOUNTER — Encounter (HOSPITAL_COMMUNITY): Payer: Self-pay | Admitting: Urology

## 2015-07-04 ENCOUNTER — Encounter: Payer: Medicare Other | Admitting: Speech Pathology

## 2015-07-06 ENCOUNTER — Encounter: Payer: Medicare Other | Admitting: Speech Pathology

## 2015-08-23 ENCOUNTER — Ambulatory Visit: Payer: Medicare (Managed Care) | Admitting: Podiatry

## 2015-09-05 ENCOUNTER — Ambulatory Visit (INDEPENDENT_AMBULATORY_CARE_PROVIDER_SITE_OTHER): Payer: Medicare (Managed Care) | Admitting: Podiatry

## 2015-09-05 ENCOUNTER — Encounter: Payer: Self-pay | Admitting: Podiatry

## 2015-09-05 DIAGNOSIS — B351 Tinea unguium: Secondary | ICD-10-CM

## 2015-09-05 DIAGNOSIS — M79673 Pain in unspecified foot: Secondary | ICD-10-CM

## 2015-09-05 NOTE — Patient Instructions (Signed)
Diabetes and Foot Care Diabetes may cause you to have problems because of poor blood supply (circulation) to your feet and legs. This may cause the skin on your feet to become thinner, break easier, and heal more slowly. Your skin may become dry, and the skin may peel and crack. You may also have nerve damage in your legs and feet causing decreased feeling in them. You may not notice minor injuries to your feet that could lead to infections or more serious problems. Taking care of your feet is one of the most important things you can do for yourself.  HOME CARE INSTRUCTIONS  Wear shoes at all times, even in the house. Do not go barefoot. Bare feet are easily injured.  Check your feet daily for blisters, cuts, and redness. If you cannot see the bottom of your feet, use a mirror or ask someone for help.  Wash your feet with warm water (do not use hot water) and mild soap. Then pat your feet and the areas between your toes until they are completely dry. Do not soak your feet as this can dry your skin.  Apply a moisturizing lotion or petroleum jelly (that does not contain alcohol and is unscented) to the skin on your feet and to dry, brittle toenails. Do not apply lotion between your toes.  Trim your toenails straight across. Do not dig under them or around the cuticle. File the edges of your nails with an emery board or nail file.  Do not cut corns or calluses or try to remove them with medicine.  Wear clean socks or stockings every day. Make sure they are not too tight. Do not wear knee-high stockings since they may decrease blood flow to your legs.  Wear shoes that fit properly and have enough cushioning. To break in new shoes, wear them for just a few hours a day. This prevents you from injuring your feet. Always look in your shoes before you put them on to be sure there are no objects inside.  Do not cross your legs. This may decrease the blood flow to your feet.  If you find a minor scrape,  cut, or break in the skin on your feet, keep it and the skin around it clean and dry. These areas may be cleansed with mild soap and water. Do not cleanse the area with peroxide, alcohol, or iodine.  When you remove an adhesive bandage, be sure not to damage the skin around it.  If you have a wound, look at it several times a day to make sure it is healing.  Do not use heating pads or hot water bottles. They may burn your skin. If you have lost feeling in your feet or legs, you may not know it is happening until it is too late.  Make sure your health care provider performs a complete foot exam at least annually or more often if you have foot problems. Report any cuts, sores, or bruises to your health care provider immediately. SEEK MEDICAL CARE IF:   You have an injury that is not healing.  You have cuts or breaks in the skin.  You have an ingrown nail.  You notice redness on your legs or feet.  You feel burning or tingling in your legs or feet.  You have pain or cramps in your legs and feet.  Your legs or feet are numb.  Your feet always feel cold. SEEK IMMEDIATE MEDICAL CARE IF:   There is increasing redness,   swelling, or pain in or around a wound.  There is a red line that goes up your leg.  Pus is coming from a wound.  You develop a fever or as directed by your health care provider.  You notice a bad smell coming from an ulcer or wound.   This information is not intended to replace advice given to you by your health care provider. Make sure you discuss any questions you have with your health care provider.   Document Released: 04/19/2000 Document Revised: 12/23/2012 Document Reviewed: 09/29/2012 Elsevier Interactive Patient Education 2016 [employer]  

## 2015-09-05 NOTE — Progress Notes (Signed)
Patient ID: Clayton SkyeWilliam G Comley, male   DOB: 05-Oct-1919, 82 y.o.   MRN: [id number]  Subjective: This patient presents with daughter and treatment room who is requesting debridement of her father's toenails. She is concerned the toenails are thickened and elongated.  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

## 2015-12-05 ENCOUNTER — Ambulatory Visit: Payer: Medicare (Managed Care) | Admitting: Podiatry

## 2016-01-02 ENCOUNTER — Encounter: Payer: Self-pay | Admitting: Podiatry

## 2016-01-02 ENCOUNTER — Ambulatory Visit (INDEPENDENT_AMBULATORY_CARE_PROVIDER_SITE_OTHER): Payer: Medicare (Managed Care) | Admitting: Podiatry

## 2016-01-02 DIAGNOSIS — B351 Tinea unguium: Secondary | ICD-10-CM | POA: Diagnosis not present

## 2016-01-02 DIAGNOSIS — M79676 Pain in unspecified toe(s): Secondary | ICD-10-CM

## 2016-01-02 DIAGNOSIS — R0989 Other specified symptoms and signs involving the circulatory and respiratory systems: Secondary | ICD-10-CM

## 2016-01-02 NOTE — Progress Notes (Signed)
Patient ID: Clayton [name], male   DOB: 1919-06-17, 82 y.o.   MRN: [id number]    Subjective: This patient presents with daughter and treatment room who is requesting debridement of her father's toenails. She is concerned the toenails are thickened and elongated.  Objective: Patient does respond to questioning in a delayed fashion No open skin lesions bilaterally The toenails are elongated, brittle, deformed, hypertrophic 6-10  Assessment: Diabetic Mycotic toenails 6-10  Plan: Debridement toenails 6-10 mechanically and electronically with slight bleeding fourth left toe, treated with antibiotic ointment and Band-Aid. Patient advised removed Band-Aid 1-3 days and apply topical antibiotic ointment and Band-Aid daily until a scab forms.  Reappoint at 3 month

## 2016-01-02 NOTE — Patient Instructions (Signed)
Remove the Band-Aid on the fourth left toe 1-3 days and continue to apply topical antibiotic ointment and a Band-Aid daily until a scab forms  Diabetes and Foot Care Diabetes may cause you to have problems because of poor blood supply (circulation) to your feet and legs. This may cause the skin on your feet to become thinner, break easier, and heal more slowly. Your skin may become dry, and the skin may peel and crack. You may also have nerve damage in your legs and feet causing decreased feeling in them. You may not notice minor injuries to your feet that could lead to infections or more serious problems. Taking care of your feet is one of the most important things you can do for yourself.  HOME CARE INSTRUCTIONS  Wear shoes at all times, even in the house. Do not go barefoot. Bare feet are easily injured.  Check your feet daily for blisters, cuts, and redness. If you cannot see the bottom of your feet, use a mirror or ask someone for help.  Wash your feet with warm water (do not use hot water) and mild soap. Then pat your feet and the areas between your toes until they are completely dry. Do not soak your feet as this can dry your skin.  Apply a moisturizing lotion or petroleum jelly (that does not contain alcohol and is unscented) to the skin on your feet and to dry, brittle toenails. Do not apply lotion between your toes.  Trim your toenails straight across. Do not dig under them or around the cuticle. File the edges of your nails with an emery board or nail file.  Do not cut corns or calluses or try to remove them with medicine.  Wear clean socks or stockings every day. Make sure they are not too tight. Do not wear knee-high stockings since they may decrease blood flow to your legs.  Wear shoes that fit properly and have enough cushioning. To break in new shoes, wear them for just a few hours a day. This prevents you from injuring your feet. Always look in your shoes before you put them on  to be sure there are no objects inside.  Do not cross your legs. This may decrease the blood flow to your feet.  If you find a minor scrape, cut, or break in the skin on your feet, keep it and the skin around it clean and dry. These areas may be cleansed with mild soap and water. Do not cleanse the area with peroxide, alcohol, or iodine.  When you remove an adhesive bandage, be sure not to damage the skin around it.  If you have a wound, look at it several times a day to make sure it is healing.  Do not use heating pads or hot water bottles. They may burn your skin. If you have lost feeling in your feet or legs, you may not know it is happening until it is too late.  Make sure your health care provider performs a complete foot exam at least annually or more often if you have foot problems. Report any cuts, sores, or bruises to your health care provider immediately. SEEK MEDICAL CARE IF:   You have an injury that is not healing.  You have cuts or breaks in the skin.  You have an ingrown nail.  You notice redness on your legs or feet.  You feel burning or tingling in your legs or feet.  You have pain or cramps in your legs  and feet.  Your legs or feet are numb.  Your feet always feel cold. SEEK IMMEDIATE MEDICAL CARE IF:   There is increasing redness, swelling, or pain in or around a wound.  There is a red line that goes up your leg.  Pus is coming from a wound.  You develop a fever or as directed by your health care provider.  You notice a bad smell coming from an ulcer or wound.   This information is not intended to replace advice given to you by your health care provider. Make sure you discuss any questions you have with your health care provider.   Document Released: 04/19/2000 Document Revised: 12/23/2012 Document Reviewed: 09/29/2012 Elsevier Interactive Patient Education [employer].

## 2016-04-02 ENCOUNTER — Encounter: Payer: Self-pay | Admitting: Podiatry

## 2016-04-02 ENCOUNTER — Ambulatory Visit (INDEPENDENT_AMBULATORY_CARE_PROVIDER_SITE_OTHER): Payer: Medicare (Managed Care) | Admitting: Podiatry

## 2016-04-02 VITALS — BP 134/77 | HR 64 | Resp 14

## 2016-04-02 DIAGNOSIS — R0989 Other specified symptoms and signs involving the circulatory and respiratory systems: Secondary | ICD-10-CM

## 2016-04-02 DIAGNOSIS — B351 Tinea unguium: Secondary | ICD-10-CM

## 2016-04-02 NOTE — Progress Notes (Signed)
Patient ID: Clayton SkyeWilliam G [name], male   DOB: 1920/03/09, 82 y.o.   MRN: [id number]    Subjective: This patient presents with daughter and treatment room who is requesting debridement of her father's toenails. She is concerned the toenails are thickened and elongated.  Objective: Patient is visually impaired Patient does respond to questioning DP and PT pulses nonpalpable bilaterally Capillary reflex immediate bilaterally Sensation to 10 g monofilament wire intact 4/5 bilaterally Vibratory sensation reactive bilaterally Ankle reflexes reactive bilaterally Patient walks slowly with a roller walker  No open skin lesions bilaterally The toenails are elongated, brittle, deformed, hypertrophic 6-10  Assessment: Diabetic Absent pedal pulses bilaterally Protective sensation intact bilaterally Mycotic toenails 6-10  Plan: Debridement toenails 6-10 mechanically and electronically without any bleeding   Reappoint at 3 month

## 2016-04-02 NOTE — Patient Instructions (Signed)

## 2016-05-25 ENCOUNTER — Encounter (HOSPITAL_COMMUNITY): Payer: Self-pay | Admitting: Emergency Medicine

## 2016-05-25 ENCOUNTER — Emergency Department (HOSPITAL_COMMUNITY): Payer: Medicare Other

## 2016-05-25 ENCOUNTER — Emergency Department (HOSPITAL_COMMUNITY)
Admission: EM | Admit: 2016-05-25 | Discharge: 2016-05-25 | Disposition: A | Discharge status: Home / Self Care | Payer: Medicare Other | Attending: Emergency Medicine | Admitting: Emergency Medicine

## 2016-05-25 DIAGNOSIS — Z79899 Other long term (current) drug therapy: Secondary | ICD-10-CM | POA: Diagnosis not present

## 2016-05-25 DIAGNOSIS — Z87891 Personal history of nicotine dependence: Secondary | ICD-10-CM | POA: Insufficient documentation

## 2016-05-25 DIAGNOSIS — Z7984 Long term (current) use of oral hypoglycemic drugs: Secondary | ICD-10-CM | POA: Diagnosis not present

## 2016-05-25 DIAGNOSIS — I129 Hypertensive chronic kidney disease with stage 1 through stage 4 chronic kidney disease, or unspecified chronic kidney disease: Secondary | ICD-10-CM | POA: Insufficient documentation

## 2016-05-25 DIAGNOSIS — N390 Urinary tract infection, site not specified: Secondary | ICD-10-CM | POA: Diagnosis not present

## 2016-05-25 DIAGNOSIS — N183 Chronic kidney disease, stage 3 (moderate): Secondary | ICD-10-CM | POA: Diagnosis not present

## 2016-05-25 DIAGNOSIS — E1122 Type 2 diabetes mellitus with diabetic chronic kidney disease: Secondary | ICD-10-CM | POA: Insufficient documentation

## 2016-05-25 DIAGNOSIS — R531 Weakness: Secondary | ICD-10-CM | POA: Diagnosis present

## 2016-05-25 LAB — COMPREHENSIVE METABOLIC PANEL
ALT: 16 U/L — ABNORMAL LOW (ref 17–63)
AST: 27 U/L (ref 15–41)
Albumin: 4.1 g/dL (ref 3.5–5.0)
Alkaline Phosphatase: 120 U/L (ref 38–126)
Anion gap: 10 (ref 5–15)
BUN: 29 mg/dL — ABNORMAL HIGH (ref 6–20)
CHLORIDE: 101 mmol/L (ref 101–111)
CO2: 25 mmol/L (ref 22–32)
Calcium: 9.3 mg/dL (ref 8.9–10.3)
Creatinine, Ser: 1.57 mg/dL — ABNORMAL HIGH (ref 0.61–1.24)
GFR calc non Af Amer: 36 mL/min — ABNORMAL LOW (ref >60)
GFR, EST AFRICAN AMERICAN: 42 mL/min — AB (ref >60)
Glucose, Bld: 187 mg/dL — ABNORMAL HIGH (ref 65–99)
Potassium: 5 mmol/L (ref 3.5–5.1)
SODIUM: 136 mmol/L (ref 135–145)
Total Bilirubin: 0.7 mg/dL (ref 0.3–1.2)
Total Protein: 7.2 g/dL (ref 6.5–8.1)

## 2016-05-25 LAB — CBC WITH DIFFERENTIAL/PLATELET
Basophils Absolute: 0 10*3/uL (ref 0.0–0.1)
Basophils Relative: 0 %
EOS ABS: 0 10*3/uL (ref 0.0–0.7)
EOS PCT: 0 %
HCT: 38.2 % — ABNORMAL LOW (ref 39.0–52.0)
Hemoglobin: 12.6 g/dL — ABNORMAL LOW (ref 13.0–17.0)
LYMPHS ABS: 0.5 10*3/uL — AB (ref 0.7–4.0)
Lymphocytes Relative: 2 %
MCH: 31.7 pg (ref 26.0–34.0)
MCHC: 33 g/dL (ref 30.0–36.0)
MCV: 96 fL (ref 78.0–100.0)
MONOS PCT: 7 %
Monocytes Absolute: 1.5 10*3/uL — ABNORMAL HIGH (ref 0.1–1.0)
Neutro Abs: 19.7 10*3/uL — ABNORMAL HIGH (ref 1.7–7.7)
Neutrophils Relative %: 91 %
PLATELETS: 222 10*3/uL (ref 150–400)
RBC: 3.98 MIL/uL — AB (ref 4.22–5.81)
RDW: 13.4 % (ref 11.5–15.5)
WBC: 21.7 10*3/uL — AB (ref 4.0–10.5)

## 2016-05-25 LAB — URINALYSIS, ROUTINE W REFLEX MICROSCOPIC
BILIRUBIN URINE: NEGATIVE
Bacteria, UA: NONE SEEN
Glucose, UA: 50 mg/dL — AB
KETONES UR: NEGATIVE mg/dL
Nitrite: NEGATIVE
PH: 6 (ref 5.0–8.0)
Protein, ur: NEGATIVE mg/dL
Specific Gravity, Urine: 1.011 (ref 1.005–1.030)
Squamous Epithelial / LPF: NONE SEEN

## 2016-05-25 LAB — I-STAT TROPONIN, ED: Troponin i, poc: 0.03 ng/mL (ref 0.00–0.08)

## 2016-05-25 LAB — INFLUENZA PANEL BY PCR (TYPE A & B)
Influenza A By PCR: NEGATIVE
Influenza B By PCR: NEGATIVE

## 2016-05-25 MED ORDER — CEPHALEXIN 500 MG PO CAPS: 500.0000 mg | ORAL_CAPSULE | Freq: Two times a day (BID) | ORAL | 0 refills | Status: AC

## 2016-05-25 MED ORDER — FLUCONAZOLE 150 MG PO TABS
150.0000 mg | ORAL_TABLET | Freq: Once | ORAL | Status: AC
  Administered 2016-05-25: 150 mg via ORAL
  Filled 2016-05-25: qty 1

## 2016-05-25 NOTE — ED Triage Notes (Signed)
Per EMS. Pt from [facility]. Pt began to complain of generalized weakness this afternoon. Has had a sore throat. No cough.

## 2016-05-25 NOTE — ED Provider Notes (Signed)
[facility]-EMERGENCY DEPT Provider Note   CSN: [id number] Arrival date & time: 05/25/16  1416     History   Chief Complaint Chief Complaint  Patient presents with  . Fatigue    HPI Clayton [name] is a 83 y.o. male brought in via EMS from [facility] complaining of weakness around noon today. Daughter states that he had chills and she took his temperature and it was 94. He walked to use the restroom using his walker and was in the bathroom for a while when he called because he was unable to get up from the toilet and felt weak in his legs. They thought he might have the flu. Patient has had a little bit of a cough today. Patient denies any pain, nausea, vomiting, shortness of breath, chest pain, diarrhea, hematuria.  HPI  Past Medical History:  Diagnosis Date  . Anemia   . Cataracts, bilateral   . CKD (chronic kidney disease), stage III   . Diabetes mellitus without complication (HCC)   . Distended bladder   . Diverticulosis    on scan  . Hard of hearing   . Hiatal hernia   . Hypertension   . Kidney stones   . Macular degeneration   . paroxysmal afib    right bundle branch block  . Syncopal episodes     Patient Active Problem List   Diagnosis Date Noted  . Urinary retention 06/30/2015  . AKI (acute kidney injury) (HCC) 05/22/2015  . Acute renal failure superimposed on stage 3 chronic kidney disease (HCC) 02/07/2015  . Syncope 02/07/2015  . Normocytic anemia 02/07/2015  . Hyperkalemia 02/07/2015  . Diabetes mellitus without complication (HCC)   . paroxysmal afib   . Absolute anemia   . Faintness   . Absent pedal pulses 12/28/2014  . Essential hypertension, benign 06/27/2012  . Proximal humerus fracture, right 06/24/2012    Past Surgical History:  Procedure Laterality Date  . EYE SURGERY     cataracts with lens implants  . HERNIA REPAIR    . KIDNEY STONE SURGERY     since young age  . ORIF HUMERUS FRACTURE Right 06/26/2012   Procedure:  OPEN REDUCTION INTERNAL FIXATION (ORIF) PROXIMAL HUMERUS FRACTURE, right ;  Surgeon: Budd Palmer, MD;  Location: [facility] OR;  Service: Orthopedics;  Laterality: Right;  . TRANSURETHRAL RESECTION OF PROSTATE N/A 06/30/2015   Procedure: TRANSURETHRAL RESECTION OF THE PROSTATE (TURP);  Surgeon: Crist Fat, MD;  Location: [facility] ORS;  Service: Urology;  Laterality: N/A;       Home Medications    Prior to Admission medications   Medication Sig Start Date End Date Taking? Authorizing Provider  amLODipine (NORVASC) 2.5 MG tablet Take 2.5 mg by mouth at bedtime.   Yes Historical Provider, MD  metFORMIN (GLUCOPHAGE-XR) 500 MG 24 hr tablet Take 500 mg by mouth daily with breakfast.   Yes Historical Provider, MD  traMADol (ULTRAM) 50 MG tablet Take 1 tablet (50 mg total) by mouth 3 (three) times daily as needed (for pain). 06/30/15  Yes Crist Fat, MD  cephALEXin (KEFLEX) 500 MG capsule Take 1 capsule (500 mg total) by mouth 2 (two) times daily. 05/25/16 06/04/16  Georgiana Shore, PA-C  docusate sodium (COLACE) 100 MG capsule Take 1 capsule (100 mg total) by mouth 2 (two) times daily as needed (take to keep stool soft.). Patient not taking: Reported on 05/25/2016 06/30/15   Crist Fat, MD    Family History Family History  Problem Relation Age of Onset  . Aneurysm Father     Aortic aneurysm  . Macular degeneration Mother   . Gallbladder disease Mother   . Pancreatitis Sister   . Macular degeneration Sister     x 4    Social History Social History  Substance Use Topics  . Smoking status: Former Smoker    Types: Cigars    Quit date: 05/06/1969  . Smokeless tobacco: Never Used  . Alcohol use No     Allergies   Sulfa antibiotics   Review of Systems Review of Systems  Constitutional: Negative for chills and fever.  HENT: Negative for congestion, ear pain and sore throat.   Eyes: Negative for pain and visual disturbance.  Respiratory: Negative for cough, chest  tightness, shortness of breath, wheezing and stridor.   Cardiovascular: Negative for chest pain, palpitations and leg swelling.  Gastrointestinal: Negative for abdominal distention, abdominal pain, blood in stool, diarrhea, nausea and vomiting.  Genitourinary: Negative for dysuria and hematuria.  Musculoskeletal: Negative for arthralgias, back pain, myalgias, neck pain and neck stiffness.  Skin: Negative for color change, pallor and rash.  Neurological: Positive for weakness. Negative for dizziness, seizures, syncope, facial asymmetry, light-headedness, numbness and headaches.       Patient has word finding difficulty at baseline  All other systems reviewed and are negative.    Physical Exam Updated Vital Signs BP 160/91 (BP Location: Right Arm)   Pulse 80   Temp 98.4 F (36.9 C) (Oral)   Resp 18   Ht 5\' 11"  (1.803 m)   Wt 65.8 kg   SpO2 91%   BMI 20.22 kg/m   Physical Exam  Constitutional: He appears well-developed and well-nourished. No distress.  Afebrile, nontoxic-appearing, sitting comfortably in bed in no acute distress. Patient is at baseline per daughter and wife at bedside.  HENT:  Head: Normocephalic and atraumatic.  Mouth/Throat: No oropharyngeal exudate.  Eyes: Conjunctivae and EOM are normal. Pupils are equal, round, and reactive to light. Right eye exhibits no discharge. Left eye exhibits no discharge.  Neck: Normal range of motion. Neck supple.  Cardiovascular: Normal rate, regular rhythm and normal heart sounds.   No murmur heard. Pulmonary/Chest: Effort normal. No respiratory distress. He has no wheezes.  Bilateral basilar crackles appreciated  Abdominal: Soft. Bowel sounds are normal. He exhibits no distension and no mass. There is no tenderness. There is no guarding.  Musculoskeletal: He exhibits no edema, tenderness or deformity.  Neurological: He is alert. No cranial nerve deficit or sensory deficit. He exhibits normal muscle tone.  Neurologic Exam:   -  Mental status: Patient is alert and cooperative. Word finding difficulty at baseline.  - Cranial nerves: CN III, IV, VI: pupils equally round, reactive to light both direct and conscensual and normal accommodation. Full extra-ocular movement.  CN VII : muscles of facial expression intact. . CN X :midline uvula. XI strength of sternocleidomastoid and trapezius muscles 5/5, XII: tongue is midline when protruded.  - Motor: No involuntary movements. Muscle tone and bulk normal throughout. Muscle strength is 5/5 in bilateral elbow flexion and extension, grip, hip extension, flexion, leg flexion and extension, ankle dorsiflexion and plantar flexion.   - Sensory: Proprioception, light tough sensation intact in all extremities.   - Cerebellar: point to point movement intact in upper extremities. Patient was able to get up from bed with assistance as he normally does at baseline.    Skin: Skin is warm and dry. No rash noted. He is not  diaphoretic. No erythema.  Psychiatric: He has a normal mood and affect.  Nursing note and vitals reviewed.    ED Treatments / Results  Labs (all labs ordered are listed, but only abnormal results are displayed) Labs Reviewed  CBC WITH DIFFERENTIAL/PLATELET - Abnormal; Notable for the following:       Result Value   WBC 21.7 (*)    RBC 3.98 (*)    Hemoglobin 12.6 (*)    HCT 38.2 (*)    Neutro Abs 19.7 (*)    Lymphs Abs 0.5 (*)    Monocytes Absolute 1.5 (*)    All other components within normal limits  COMPREHENSIVE METABOLIC PANEL - Abnormal; Notable for the following:    Glucose, Bld 187 (*)    BUN 29 (*)    Creatinine, Ser 1.57 (*)    ALT 16 (*)    GFR calc non Af Amer 36 (*)    GFR calc Af Amer 42 (*)    All other components within normal limits  URINALYSIS, ROUTINE W REFLEX MICROSCOPIC - Abnormal; Notable for the following:    APPearance HAZY (*)    Glucose, UA 50 (*)    Hgb urine dipstick SMALL (*)    Leukocytes, UA LARGE (*)    All other  components within normal limits  RESPIRATORY PANEL BY PCR  URINE CULTURE  INFLUENZA PANEL BY PCR (TYPE A & B)  I-STAT TROPOININ, ED    EKG  EKG Interpretation  Date/Time:  Saturday May 25 2016 16:02:27 EST Ventricular Rate:  88 PR Interval:    QRS Duration: 136 QT Interval:  387 QTC Calculation: 469 R Axis:   91 Text Interpretation:  Sinus rhythm RBBB and LPFB old RBBB no sig change from old. Confirmed by Donnald Garre, MD, Lebron Conners [phone]) on 05/25/2016 4:15:53 PM       Radiology Dg Chest 2 View  Result Date: 05/25/2016 CLINICAL DATA:  Generalized weakness.  Bibasilar crackles on exam. EXAM: CHEST  2 VIEW COMPARISON:  05/22/2015 FINDINGS: Stable cardiomegaly. Aortic atherosclerosis. No evidence of pulmonary infiltrate or edema. Moderate size hiatal hernia again seen. T8 and T12 vertebral body compression fractures again seen with previous vertebroplasty at T12 and L1. IMPRESSION: Stable cardiomegaly and aortic atherosclerosis. No acute lung disease. Moderate hiatal hernia. Electronically Signed   By: Myles Rosenthal M.D.   On: 05/25/2016 16:58    Procedures Procedures (including critical care time)  Medications Ordered in ED Medications  fluconazole (DIFLUCAN) tablet 150 mg (150 mg Oral Given 05/25/16 1931)     Initial Impression / Assessment and Plan / ED Course  I have reviewed the triage vital signs and the nursing notes.  Pertinent labs & imaging results that were available during my care of the patient were reviewed by me and considered in my medical decision making (see chart for details).   83 y/o presenting via EMS with episode of chills and weakness once earlier today. Patient denies any pain, sob, nausea, vomiting.  Neuro exam without focal deficits. Patient is non-toxic appearing and in no acute distress. Family felt that he may have contracted the flu.  ekg NSR with unchanged RBBB from prior, troponin negative Flu swab negative Chest xray: negative for acute  cardiopulmonary process. U/A with yeast and evidence of UTI Discussed patient with Dr. Particia Nearing who recommends diflucan one dose in the ED and discharge home with keflex for UTI.  19:30 - nurse will be calling lab to check on flu swab results and will ambulate him.  Patient was ambulated with pulse ox above 92% and felt well. Will discharge home with close follow up with PCP and treat UTI. He was ready to go home. Advised to drink plenty of fluids.   Discussed strict return precautions. Patient was advised to return to the emergency department if experiencing any worsening of symptoms. Patient understood instructions and agreed with discharge plan.   Patient was also seen by supervising physician Dr. Donnald GarrePfeiffer who agrees with assessment and plan. Final Clinical Impressions(s) / ED Diagnoses   Final diagnoses:  Lower urinary tract infectious disease    New Prescriptions Discharge Medication List as of 05/25/2016  8:56 PM    START taking these medications   Details  cephALEXin (KEFLEX) 500 MG capsule Take 1 capsule (500 mg total) by mouth 2 (two) times daily., Starting Sat 05/25/2016, Until Tue 06/04/2016, Print         Georgiana ShoreJessica B Dellene Mcgroarty, PA-C 05/26/16 [id number]    Arby BarretteMarcy Pfeiffer, MD 06/03/16 1652    Arby BarretteMarcy Pfeiffer, MD 06/04/16 [phone]

## 2016-05-25 NOTE — ED Notes (Signed)
Pt in xray

## 2016-05-26 LAB — RESPIRATORY PANEL BY PCR
ADENOVIRUS-RVPPCR: NOT DETECTED
BORDETELLA PERTUSSIS-RVPCR: NOT DETECTED
CORONAVIRUS HKU1-RVPPCR: NOT DETECTED
CORONAVIRUS NL63-RVPPCR: NOT DETECTED
CORONAVIRUS OC43-RVPPCR: NOT DETECTED
Chlamydophila pneumoniae: NOT DETECTED
Coronavirus 229E: NOT DETECTED
Influenza A: NOT DETECTED
Influenza B: NOT DETECTED
METAPNEUMOVIRUS-RVPPCR: NOT DETECTED
Mycoplasma pneumoniae: NOT DETECTED
PARAINFLUENZA VIRUS 1-RVPPCR: NOT DETECTED
PARAINFLUENZA VIRUS 2-RVPPCR: NOT DETECTED
PARAINFLUENZA VIRUS 3-RVPPCR: NOT DETECTED
Parainfluenza Virus 4: NOT DETECTED
RHINOVIRUS / ENTEROVIRUS - RVPPCR: NOT DETECTED
Respiratory Syncytial Virus: NOT DETECTED

## 2016-05-27 LAB — URINE CULTURE

## 2016-07-03 ENCOUNTER — Encounter: Payer: Self-pay | Admitting: Podiatry

## 2016-07-03 ENCOUNTER — Ambulatory Visit (INDEPENDENT_AMBULATORY_CARE_PROVIDER_SITE_OTHER): Payer: Medicare (Managed Care) | Admitting: Podiatry

## 2016-07-03 VITALS — BP 157/85 | HR 79 | Resp 18

## 2016-07-03 DIAGNOSIS — B351 Tinea unguium: Secondary | ICD-10-CM

## 2016-07-03 DIAGNOSIS — I739 Peripheral vascular disease, unspecified: Secondary | ICD-10-CM | POA: Diagnosis not present

## 2016-07-03 NOTE — Progress Notes (Signed)
Patient ID: Clayton SkyeWilliam G [name], male   DOB: 10-Nov-1919, 83 y.o.   MRN: [id number]     Subjective: This patient presents with daughter and treatment room who is requesting debridement of her father's toenails. She is concerned the toenails are thickened and elongated.  Objective: Patient is visually impaired Patient does respond to questioning DP and PT pulses nonpalpable bilaterally Capillary reflex immediate bilaterally Sensation to 10 g monofilament wire intact 4/5 bilaterally Vibratory sensation reactive bilaterally Ankle reflexes reactive bilaterally Patient walks slowly with a roller walker  No open skin lesions bilaterally The toenails are elongated, brittle, deformed, hypertrophic 6-10  Assessment: Diabetic with peripheral arterial disease Absent pedal pulses bilaterally Protective sensation intact bilaterally Mycotic toenails 6-10  Plan: Debridement toenails 6-10 mechanically and electronically slight bleeding distal third right toe treated with topical antibiotic ointment and Band-Aid. Patient instructed removed Band-Aid 1-3 days and continue to apply topical antibiotic ointment and Band-Aid daily until a scab forms  Reappoint 3 months

## 2016-07-03 NOTE — Patient Instructions (Signed)
Remove Band-Aid on third right toe in 1-3 days and apply topical antibiotic ointment and Band-Aid until a scab forms  Diabetes and Foot Care Diabetes may cause you to have problems because of poor blood supply (circulation) to your feet and legs. This may cause the skin on your feet to become thinner, break easier, and heal more slowly. Your skin may become dry, and the skin may peel and crack. You may also have nerve damage in your legs and feet causing decreased feeling in them. You may not notice minor injuries to your feet that could lead to infections or more serious problems. Taking care of your feet is one of the most important things you can do for yourself. Follow these instructions at home:  Wear shoes at all times, even in the house. Do not go barefoot. Bare feet are easily injured.  Check your feet daily for blisters, cuts, and redness. If you cannot see the bottom of your feet, use a mirror or ask someone for help.  Wash your feet with warm water (do not use hot water) and mild soap. Then pat your feet and the areas between your toes until they are completely dry. Do not soak your feet as this can dry your skin.  Apply a moisturizing lotion or petroleum jelly (that does not contain alcohol and is unscented) to the skin on your feet and to dry, brittle toenails. Do not apply lotion between your toes.  Trim your toenails straight across. Do not dig under them or around the cuticle. File the edges of your nails with an emery board or nail file.  Do not cut corns or calluses or try to remove them with medicine.  Wear clean socks or stockings every day. Make sure they are not too tight. Do not wear knee-high stockings since they may decrease blood flow to your legs.  Wear shoes that fit properly and have enough cushioning. To break in new shoes, wear them for just a few hours a day. This prevents you from injuring your feet. Always look in your shoes before you put them on to be sure  there are no objects inside.  Do not cross your legs. This may decrease the blood flow to your feet.  If you find a minor scrape, cut, or break in the skin on your feet, keep it and the skin around it clean and dry. These areas may be cleansed with mild soap and water. Do not cleanse the area with peroxide, alcohol, or iodine.  When you remove an adhesive bandage, be sure not to damage the skin around it.  If you have a wound, look at it several times a day to make sure it is healing.  Do not use heating pads or hot water bottles. They may burn your skin. If you have lost feeling in your feet or legs, you may not know it is happening until it is too late.  Make sure your health care provider performs a complete foot exam at least annually or more often if you have foot problems. Report any cuts, sores, or bruises to your health care provider immediately. Contact a health care provider if:  You have an injury that is not healing.  You have cuts or breaks in the skin.  You have an ingrown nail.  You notice redness on your legs or feet.  You feel burning or tingling in your legs or feet.  You have pain or cramps in your legs and feet.  Your legs or feet are numb.  Your feet always feel cold. Get help right away if:  There is increasing redness, swelling, or pain in or around a wound.  There is a red line that goes up your leg.  Pus is coming from a wound.  You develop a fever or as directed by your health care provider.  You notice a bad smell coming from an ulcer or wound. This information is not intended to replace advice given to you by your health care provider. Make sure you discuss any questions you have with your health care provider. Document Released: 04/19/2000 Document Revised: 09/28/2015 Document Reviewed: 09/29/2012 Elsevier Interactive Patient Education  2017 [employer].

## 2016-07-31 ENCOUNTER — Inpatient Hospital Stay (HOSPITAL_COMMUNITY): Payer: Medicare (Managed Care)

## 2016-07-31 ENCOUNTER — Emergency Department (HOSPITAL_COMMUNITY): Payer: Medicare (Managed Care)

## 2016-07-31 ENCOUNTER — Inpatient Hospital Stay (HOSPITAL_COMMUNITY)
Admission: EM | Admit: 2016-07-31 | Discharge: 2016-08-05 | DRG: 065 | Disposition: A | Discharge status: Skilled Nursing Facility | Payer: Medicare (Managed Care) | Attending: Internal Medicine | Admitting: Internal Medicine

## 2016-07-31 DIAGNOSIS — Z79899 Other long term (current) drug therapy: Secondary | ICD-10-CM

## 2016-07-31 DIAGNOSIS — N390 Urinary tract infection, site not specified: Secondary | ICD-10-CM | POA: Diagnosis present

## 2016-07-31 DIAGNOSIS — E1165 Type 2 diabetes mellitus with hyperglycemia: Secondary | ICD-10-CM | POA: Diagnosis present

## 2016-07-31 DIAGNOSIS — E1151 Type 2 diabetes mellitus with diabetic peripheral angiopathy without gangrene: Secondary | ICD-10-CM | POA: Diagnosis present

## 2016-07-31 DIAGNOSIS — Z7983 Long term (current) use of bisphosphonates: Secondary | ICD-10-CM | POA: Diagnosis not present

## 2016-07-31 DIAGNOSIS — R471 Dysarthria and anarthria: Secondary | ICD-10-CM | POA: Diagnosis present

## 2016-07-31 DIAGNOSIS — Z87891 Personal history of nicotine dependence: Secondary | ICD-10-CM | POA: Diagnosis not present

## 2016-07-31 DIAGNOSIS — G8191 Hemiplegia, unspecified affecting right dominant side: Secondary | ICD-10-CM | POA: Diagnosis present

## 2016-07-31 DIAGNOSIS — I639 Cerebral infarction, unspecified: Secondary | ICD-10-CM | POA: Diagnosis not present

## 2016-07-31 DIAGNOSIS — I480 Paroxysmal atrial fibrillation: Secondary | ICD-10-CM | POA: Diagnosis present

## 2016-07-31 DIAGNOSIS — R2990 Unspecified symptoms and signs involving the nervous system: Secondary | ICD-10-CM

## 2016-07-31 DIAGNOSIS — E1122 Type 2 diabetes mellitus with diabetic chronic kidney disease: Secondary | ICD-10-CM | POA: Diagnosis present

## 2016-07-31 DIAGNOSIS — E119 Type 2 diabetes mellitus without complications: Secondary | ICD-10-CM | POA: Diagnosis not present

## 2016-07-31 DIAGNOSIS — I10 Essential (primary) hypertension: Secondary | ICD-10-CM | POA: Diagnosis not present

## 2016-07-31 DIAGNOSIS — H9190 Unspecified hearing loss, unspecified ear: Secondary | ICD-10-CM | POA: Diagnosis present

## 2016-07-31 DIAGNOSIS — E785 Hyperlipidemia, unspecified: Secondary | ICD-10-CM | POA: Diagnosis present

## 2016-07-31 DIAGNOSIS — I700 Atherosclerosis of aorta: Secondary | ICD-10-CM | POA: Diagnosis present

## 2016-07-31 DIAGNOSIS — I482 Chronic atrial fibrillation: Secondary | ICD-10-CM | POA: Diagnosis not present

## 2016-07-31 DIAGNOSIS — R269 Unspecified abnormalities of gait and mobility: Secondary | ICD-10-CM | POA: Diagnosis present

## 2016-07-31 DIAGNOSIS — I63412 Cerebral infarction due to embolism of left middle cerebral artery: Principal | ICD-10-CM | POA: Diagnosis present

## 2016-07-31 DIAGNOSIS — I129 Hypertensive chronic kidney disease with stage 1 through stage 4 chronic kidney disease, or unspecified chronic kidney disease: Secondary | ICD-10-CM | POA: Diagnosis present

## 2016-07-31 DIAGNOSIS — B952 Enterococcus as the cause of diseases classified elsewhere: Secondary | ICD-10-CM | POA: Diagnosis present

## 2016-07-31 DIAGNOSIS — N183 Chronic kidney disease, stage 3 unspecified: Secondary | ICD-10-CM | POA: Insufficient documentation

## 2016-07-31 DIAGNOSIS — Z7984 Long term (current) use of oral hypoglycemic drugs: Secondary | ICD-10-CM

## 2016-07-31 DIAGNOSIS — R1310 Dysphagia, unspecified: Secondary | ICD-10-CM | POA: Diagnosis present

## 2016-07-31 DIAGNOSIS — I499 Cardiac arrhythmia, unspecified: Secondary | ICD-10-CM | POA: Diagnosis present

## 2016-07-31 DIAGNOSIS — R4701 Aphasia: Secondary | ICD-10-CM | POA: Diagnosis not present

## 2016-07-31 DIAGNOSIS — Z66 Do not resuscitate: Secondary | ICD-10-CM | POA: Diagnosis present

## 2016-07-31 DIAGNOSIS — J189 Pneumonia, unspecified organism: Secondary | ICD-10-CM | POA: Diagnosis not present

## 2016-07-31 DIAGNOSIS — I4891 Unspecified atrial fibrillation: Secondary | ICD-10-CM | POA: Diagnosis not present

## 2016-07-31 LAB — COMPREHENSIVE METABOLIC PANEL
ALK PHOS: 78 U/L (ref 38–126)
ALT: 11 U/L — ABNORMAL LOW (ref 17–63)
AST: 18 U/L (ref 15–41)
Albumin: 3.8 g/dL (ref 3.5–5.0)
Anion gap: 11 (ref 5–15)
BUN: 28 mg/dL — ABNORMAL HIGH (ref 6–20)
CALCIUM: 9.6 mg/dL (ref 8.9–10.3)
CO2: 25 mmol/L (ref 22–32)
CREATININE: 1.41 mg/dL — AB (ref 0.61–1.24)
Chloride: 102 mmol/L (ref 101–111)
GFR calc non Af Amer: 41 mL/min — ABNORMAL LOW (ref >60)
GFR, EST AFRICAN AMERICAN: 48 mL/min — AB (ref >60)
GLUCOSE: 155 mg/dL — AB (ref 65–99)
Potassium: 4.5 mmol/L (ref 3.5–5.1)
SODIUM: 138 mmol/L (ref 135–145)
Total Bilirubin: 0.7 mg/dL (ref 0.3–1.2)
Total Protein: 6.9 g/dL (ref 6.5–8.1)

## 2016-07-31 LAB — I-STAT CHEM 8, ED
BUN: 32 mg/dL — ABNORMAL HIGH (ref 6–20)
Calcium, Ion: 1.21 mmol/L (ref 1.15–1.40)
Chloride: 103 mmol/L (ref 101–111)
Creatinine, Ser: 1.4 mg/dL — ABNORMAL HIGH (ref 0.61–1.24)
Glucose, Bld: 154 mg/dL — ABNORMAL HIGH (ref 65–99)
HCT: 40 % (ref 39.0–52.0)
Hemoglobin: 13.6 g/dL (ref 13.0–17.0)
Potassium: 4.5 mmol/L (ref 3.5–5.1)
Sodium: 139 mmol/L (ref 135–145)
TCO2: 28 mmol/L (ref 0–100)

## 2016-07-31 LAB — CBC
HEMATOCRIT: 40.5 % (ref 39.0–52.0)
Hemoglobin: 13.3 g/dL (ref 13.0–17.0)
MCH: 31.6 pg (ref 26.0–34.0)
MCHC: 32.8 g/dL (ref 30.0–36.0)
MCV: 96.2 fL (ref 78.0–100.0)
PLATELETS: 211 10*3/uL (ref 150–400)
RBC: 4.21 MIL/uL — ABNORMAL LOW (ref 4.22–5.81)
RDW: 13.2 % (ref 11.5–15.5)
WBC: 9.2 10*3/uL (ref 4.0–10.5)

## 2016-07-31 LAB — URINALYSIS, ROUTINE W REFLEX MICROSCOPIC
Bacteria, UA: NONE SEEN
Bilirubin Urine: NEGATIVE
Glucose, UA: NEGATIVE mg/dL
Ketones, ur: NEGATIVE mg/dL
Nitrite: NEGATIVE
PROTEIN: NEGATIVE mg/dL
SQUAMOUS EPITHELIAL / LPF: NONE SEEN
Specific Gravity, Urine: 1.012 (ref 1.005–1.030)
pH: 7 (ref 5.0–8.0)

## 2016-07-31 LAB — PROTIME-INR
INR: 1.02
PROTHROMBIN TIME: 13.4 s (ref 11.4–15.2)

## 2016-07-31 LAB — DIFFERENTIAL
Basophils Absolute: 0.1 10*3/uL (ref 0.0–0.1)
Basophils Relative: 1 %
Eosinophils Absolute: 0.5 10*3/uL (ref 0.0–0.7)
Eosinophils Relative: 6 %
LYMPHS ABS: 1.3 10*3/uL (ref 0.7–4.0)
LYMPHS PCT: 14 %
MONO ABS: 0.7 10*3/uL (ref 0.1–1.0)
Monocytes Relative: 8 %
NEUTROS ABS: 6.6 10*3/uL (ref 1.7–7.7)
Neutrophils Relative %: 71 %

## 2016-07-31 LAB — APTT: aPTT: 28 seconds (ref 24–36)

## 2016-07-31 LAB — CBG MONITORING, ED: Glucose-Capillary: 139 mg/dL — ABNORMAL HIGH (ref 65–99)

## 2016-07-31 LAB — I-STAT TROPONIN, ED: Troponin i, poc: 0.02 ng/mL (ref 0.00–0.08)

## 2016-07-31 LAB — ETHANOL: Alcohol, Ethyl (B): <5 mg/dL (ref <5)

## 2016-07-31 MED ORDER — INSULIN ASPART 100 UNIT/ML ~~LOC~~ SOLN
0.0000 [IU] | Freq: Three times a day (TID) | SUBCUTANEOUS | Status: DC
  Administered 2016-08-01 – 2016-08-02 (×4): 1 [IU] via SUBCUTANEOUS
  Administered 2016-08-03 – 2016-08-04 (×2): 2 [IU] via SUBCUTANEOUS
  Administered 2016-08-05: 1 [IU] via SUBCUTANEOUS
  Administered 2016-08-05: 5 [IU] via SUBCUTANEOUS

## 2016-07-31 MED ORDER — POLYETHYLENE GLYCOL 3350 17 G PO PACK: 17.0000 g | PACK | Freq: Every day | ORAL | Status: DC | PRN

## 2016-07-31 MED ORDER — ONDANSETRON HCL 4 MG PO TABS: 4.0000 mg | ORAL_TABLET | Freq: Four times a day (QID) | ORAL | Status: DC | PRN

## 2016-07-31 MED ORDER — SODIUM CHLORIDE 0.9 % IV SOLN
INTRAVENOUS | Status: DC
  Administered 2016-07-31 – 2016-08-03 (×4): via INTRAVENOUS

## 2016-07-31 MED ORDER — ACETAMINOPHEN 325 MG PO TABS: 650.0000 mg | ORAL_TABLET | Freq: Four times a day (QID) | ORAL | Status: DC | PRN

## 2016-07-31 MED ORDER — ONDANSETRON HCL 4 MG/2ML IJ SOLN: 4.0000 mg | Freq: Four times a day (QID) | INTRAMUSCULAR | Status: DC | PRN

## 2016-07-31 MED ORDER — ASPIRIN 300 MG RE SUPP
300.0000 mg | Freq: Once | RECTAL | Status: AC
  Administered 2016-07-31: 300 mg via RECTAL
  Filled 2016-07-31: qty 1

## 2016-07-31 MED ORDER — ACETAMINOPHEN 650 MG RE SUPP: 650.0000 mg | Freq: Four times a day (QID) | RECTAL | Status: DC | PRN

## 2016-07-31 MED ORDER — ASPIRIN EC 325 MG PO TBEC: 325.0000 mg | DELAYED_RELEASE_TABLET | Freq: Every day | ORAL | Status: DC

## 2016-07-31 MED ORDER — HYDRALAZINE HCL 20 MG/ML IJ SOLN: 10.0000 mg | Freq: Four times a day (QID) | INTRAMUSCULAR | Status: DC | PRN

## 2016-07-31 MED ORDER — DEXTROSE 5 % IV SOLN
1.0000 g | INTRAVENOUS | Status: DC
  Administered 2016-07-31 – 2016-08-04 (×5): 1 g via INTRAVENOUS
  Filled 2016-07-31 (×6): qty 10

## 2016-07-31 MED ORDER — HEPARIN SODIUM (PORCINE) 5000 UNIT/ML IJ SOLN
5000.0000 [IU] | Freq: Three times a day (TID) | INTRAMUSCULAR | Status: DC
  Administered 2016-07-31 – 2016-08-05 (×13): 5000 [IU] via SUBCUTANEOUS
  Filled 2016-07-31 (×14): qty 1

## 2016-07-31 MED ORDER — IOPAMIDOL (ISOVUE-370) INJECTION 76%
INTRAVENOUS | Status: AC
  Administered 2016-07-31: 50 mL
  Filled 2016-07-31: qty 50

## 2016-07-31 MED ORDER — STROKE: EARLY STAGES OF RECOVERY BOOK
Freq: Once | Status: DC
  Filled 2016-07-31 (×2): qty 1

## 2016-07-31 MED ORDER — ASPIRIN 300 MG RE SUPP
300.0000 mg | Freq: Every day | RECTAL | Status: DC
  Administered 2016-08-02: 300 mg via RECTAL
  Filled 2016-07-31 (×2): qty 1

## 2016-07-31 NOTE — Plan of Care (Signed)
Problem: Nutrition: Goal: Risk of aspiration will decrease Outcome: Progressing SLP evaluation complete; patient NPO except medications crushed with applesauce.

## 2016-07-31 NOTE — Progress Notes (Signed)
Patient is off the floor to MRI.

## 2016-07-31 NOTE — Evaluation (Signed)
Clinical/Bedside Swallow Evaluation Patient Details  Name: Clayton [name] MRN: [id number] Date of Birth: 1920-03-21  Today's Date: 07/31/2016 Time: SLP Start Time (ACUTE ONLY): 1453 SLP Stop Time (ACUTE ONLY): 1526 SLP Time Calculation (min) (ACUTE ONLY): 33 min  Past Medical History:  Past Medical History:  Diagnosis Date  . Anemia   . Cataracts, bilateral   . CKD (chronic kidney disease), stage III   . Diabetes mellitus without complication (HCC)   . Distended bladder   . Diverticulosis    on scan  . Hard of hearing   . Hiatal hernia   . Hypertension   . Kidney stones   . Macular degeneration   . paroxysmal afib    right bundle branch block  . Syncopal episodes    Past Surgical History:  Past Surgical History:  Procedure Laterality Date  . EYE SURGERY     cataracts with lens implants  . HERNIA REPAIR    . KIDNEY STONE SURGERY     since young age  . ORIF HUMERUS FRACTURE Right 06/26/2012   Procedure: OPEN REDUCTION INTERNAL FIXATION (ORIF) PROXIMAL HUMERUS FRACTURE, right ;  Surgeon: Budd Palmer, MD;  Location: [facility] OR;  Service: Orthopedics;  Laterality: Right;  . TRANSURETHRAL RESECTION OF PROSTATE N/A 06/30/2015   Procedure: TRANSURETHRAL RESECTION OF THE PROSTATE (TURP);  Surgeon: Crist Fat, MD;  Location: [facility];  Service: Urology;  Laterality: N/A;   HPI:  83 y.o. male with baseline dysarthria and hx DMII, HTN, arrived to ED from home with aphasia, right-sided weakness.  Head CT early left parietal CVA. Hx of dysphagia, hence failed RN stroke swallow screen. Evaluated by SLP during hospitalization in [facility] Dec 2016 - clinical swallow evaluation completed and pureed diet, thin liquids recommended.  Pt had OP SLP services upon return home.     Assessment / Plan / Recommendation Clinical Impression  Pt with + s/s of dysphagia with risk for aspiration.  Presents with weak phonation, focal CN deficits CN VII on right, CN XII tongue deviation to right.   Prolonged oral preparation, inferred swallow delay, wet phonation/throat clearing post swallow.  Concerns for silent aspiration.  Recommend continuing NPO for now, except meds crushed in puree; will schedule MBS for next date.  Educated pt, dtr, wife re: results and recs; family verbalizes agreement.   SLP Visit Diagnosis: Dysphagia, unspecified (R13.10)    Aspiration Risk  Moderate aspiration risk    Diet Recommendation   npo except meds crushed in puree  Medication Administration: Crushed with puree    Other  Recommendations     Follow up Recommendations   MBS next date.      Frequency and Duration            Prognosis        Swallow Study   General Date of Onset: 07/31/16 HPI: 83 y.o. male with baseline dysarthria and hx DMII, HTN, arrived to ED from home with aphasia, right-sided weakness.  Head CT early left parietal CVA. Hx of dysphagia, hence failed RN stroke swallow screen. Evaluated by SLP during hospitalization in [facility] Dec 2016 - clinical swallow evaluation completed and pureed diet, thin liquids recommended.  Pt had OP SLP services upon return home.   Type of Study: Bedside Swallow Evaluation Previous Swallow Assessment: dysphagia tx jan 2018 at OP Diet Prior to this Study: Regular;Thin liquids Temperature Spikes Noted: No Respiratory Status: Room air History of Recent Intubation: No Behavior/Cognition: Alert Oral Cavity Assessment: Within Functional Limits Oral  Care Completed by SLP: Yes Oral Cavity - Dentition: Missing dentition Vision: Functional for self-feeding Self-Feeding Abilities: Total assist Patient Positioning: Partially reclined Baseline Vocal Quality: Low vocal intensity Volitional Cough: Weak Volitional Swallow: Able to elicit    Oral/Motor/Sensory Function Overall Oral Motor/Sensory Function: Mild impairment Facial Symmetry: Abnormal symmetry right;Suspected CN VII (facial) dysfunction Facial Strength: Reduced right;Suspected CN VII  (facial) dysfunction Lingual Symmetry: Abnormal symmetry right;Suspected CN XII (hypoglossal) dysfunction   Ice Chips Ice chips: Not tested   Thin Liquid Thin Liquid: Impaired Presentation: Spoon Oral Phase Functional Implications: Prolonged oral transit Pharyngeal  Phase Impairments: Multiple swallows;Wet Vocal Quality;Throat Clearing - Immediate    Nectar Thick Nectar Thick Liquid: Not tested   Honey Thick Honey Thick Liquid: Not tested   Puree Puree: Impaired Presentation: Spoon Oral Phase Functional Implications: Prolonged oral transit Pharyngeal Phase Impairments: Multiple swallows   Solid   GO   Solid: Not tested    Functional Assessment Tool Used: clinical judgment Functional Limitations: Swallowing Swallow Current Status ([id number]([id number]): At least 40 percent but less than 60 percent impaired, limited or restricted Swallow Goal Status [phone]([id number]): At least 20 percent but less than 40 percent impaired, limited or restricted   Blenda MountsCouture, [place] Torok Laurice 07/31/2016,3:27 PM

## 2016-07-31 NOTE — Consult Note (Signed)
Neurology Consultation Reason for Consult: aphasia Referring Physician: Irene LimboGoodrich, D  CC: aphasia  History is obtained from: wife, daughter  HPI: Clayton [name] is a 83 y.o. male who was last in his normal state yesterday evening. When he woke up, his wife helped him get to the bathroom, did not notice any weakness but he did not speak to her. He then came into the den and sat down, but was indicating that something was wrong. She noticed then that his speech was not normal and EMS was called.   LKW: 3/27 prior to bed tpa given?: no, outside of window   ROS:  Unable to obtain due to altered mental status.   Past Medical History:  Diagnosis Date  . Anemia   . Cataracts, bilateral   . CKD (chronic kidney disease), stage III   . Diabetes mellitus without complication (HCC)   . Distended bladder   . Diverticulosis    on scan  . Hard of hearing   . Hiatal hernia   . Hypertension   . Kidney stones   . Macular degeneration   . paroxysmal afib    right bundle branch block  . Syncopal episodes      Family History  Problem Relation Age of Onset  . Aneurysm Father     Aortic aneurysm  . Macular degeneration Mother   . Gallbladder disease Mother   . Pancreatitis Sister   . Macular degeneration Sister     x 4     Social History:  reports that he quit smoking about 47 years ago. His smoking use included Cigars. He has never used smokeless tobacco. He reports that he does not drink alcohol or use drugs.   Exam: Current vital signs: BP (!) 155/82   Pulse 78   Temp 97.6 F (36.4 C)   Resp 14   Ht 5\' 11"  (1.803 m)   Wt 63.9 kg (140 lb 14 oz)   SpO2 97%   BMI 19.65 kg/m  Vital signs in last 24 hours: Temp:  [97.6 F (36.4 C)] 97.6 F (36.4 C) (03/28 1043) Pulse Rate:  [77-80] 78 (03/28 1015) Resp:  [10-14] 14 (03/28 1015) BP: (148-164)/(82-91) 155/82 (03/28 1015) SpO2:  [96 %-97 %] 97 % (03/28 1015) Weight:  [63.9 kg (140 lb 14 oz)-65.8 kg (145 lb)] 63.9 kg (140  lb 14 oz) (03/28 1000)   Physical Exam  Constitutional: Appears well-developed and well-nourished.  Psych: Affect appropriate to situation Eyes: No scleral injection HENT: No OP obstrucion Head: Normocephalic.  Cardiovascular: Normal rate and regular rhythm.  Respiratory: Effort normal and breath sounds normal to anterior ascultation GI: Soft.  No distension. There is no tenderness.  Skin: WDI  Neuro: Mental Status: Patient is awake, alert, he is clearly aphasic though he can follow some Simple commands and he is able to say a couple of words. Cranial Nerves: II: Initially appeared to have a right hemianopia to blink to threat, but subsequently was able to count fingers. Pupils are equal, round, and reactive to light.   III,IV, VI: EOMI without ptosis or diploplia.  V: Facial sensation is diminished to pinprick on the right VII: Facial movement with mild facial weakness on the right VIII: hearing is intact to voice X: Uvula elevates symmetrically XI: Shoulder shrug is symmetric. XII: tongue is midline without atrophy or fasciculations.  Motor: Tone is normal. Bulk is normal. 5/5 strength was present on the left, he initially had mild drift on the right but this  subsequently resolved. Sensory: Sensation is diminished throughout the right side Cerebellar: No clear ataxia does not cooperate with finger-nose-finger    I have reviewed labs in epic and the results pertinent to this consultation are: Borderline creatinine  I have reviewed the images obtained: CT-suspicious for left parietal infarct, CTA negative for large vessel occlusion  Impression: 83 year old male with new onset aphasia, right hypoesthesia consistent with acute infarct. He is outside the window for IV TPA and no target for intra-arterial intervention.  Recommendations: 1. HgbA1c, fasting lipid panel 2. MRI of the brain without contrast 3. Frequent neuro checks 4. Echocardiogram 5. Prophylactic  therapy-Antiplatelet med: Aspirin - dose 325mg  PO or 300mg  PR 6. Risk factor modification 7. Telemetry monitoring 8. PT consult, OT consult, Speech consult 9. please page stroke NP  Or  PA  Or MD  from 8am -4 pm starting 3/29 as this patient will be followed by the stroke team at this point.   You can look them up on [email or web address]     Ritta Slot, MD [facility] [phone]  If 7pm- 7am, please page neurology on call as listed in AMION.

## 2016-07-31 NOTE — ED Triage Notes (Signed)
Pt. Coming from home via [facility] for slurred speech. Pt. Hx of muscle disorder that causes his speech to be slurred at baseline. Wife reports when they woke up today it was different than baseline, but could not describe how. Speech incomprehensible at this time. Pt. Is able to follow commands. EMS reports right arm and leg drift. Pt. Recent right shoulder surgery. Pt. Last seen normal before bed last night, but wife could not give a time. Pt. Hx of similar episode that ended up being a kidney infection, per wife.

## 2016-07-31 NOTE — ED Notes (Signed)
Admitting at bedside 

## 2016-07-31 NOTE — ED Notes (Signed)
Attempted report 

## 2016-07-31 NOTE — Code Documentation (Signed)
83 year old [facility] with slurred speech - has hx of slurred speech but worse today.  On arrival EMS reports that he was LSW last night - EDP spoke with wife who indicated he may have been normal this AM - so code stroke was called.  EMS reports in field he had right side weakness and not speaking at all. Patient has history of weak swallowing muscles and slurred speech at baseline.  On arrival he had some right sided drift and aphasia.Able to follow simple commands.  Post CT scan and CTA he had resolved the right side drift - and beginning to slowly make some words.  . Dr. Amada JupiterKirkpatrick speaking with family - no LVO on CTA per Dr. Amada JupiterKirkpatrick - NIHSS originally 11 - now down to 6.  Outside tPA window.  Handoff to Sprint Nextel CorporationErin RN.  To call as needed.

## 2016-07-31 NOTE — H&P (Signed)
History and Physical    DEMARIOUS KAPUR ZOX:[id number] DOB: [birth year] DOA: 07/31/2016  PCP: Gwen Pounds, MD   Patient coming from: home  Chief Complaint: slurred speech and right sided weakness  HPI: Clayton [name] is a 83 y.o. male with medical history significant of DM type II,  CKD, HTN, PAF, anemia who presented to the ED via EMS with c/o slurred speech and right sided weakness Patient was last seen normal last night, but the wife cannot remember exactly what time.  He woke up this morning with slurred speech and EMS noted right arm drafting and right leg weakness. Patient has episodes of slurred speech at times, but today his speech was completely incomprehensible and wife said this was worse than at baseline. This morning he woke up was able to go to the bathroom, but the first sentence spoke was incomprehensible so the exact onset of symptoms is unknown  ED Course: On arrival to the ED his VS were stable, code stroke was activated and neurology seen the patient Head CT demonstrated no intracranial hemorrhage, possible early left parietal lobe infarct. Patient underwent brain and neck CT angiogram which was negative for large vessel occlusion, showed 50% proximal right ICA stenosis and 30% proximal and mild intracranial left ICA stenosis Blood work was unremarkable with creatinine near baseline and mild hyperglycemia Troponin was negative - 0.02  Review of Systems: As per HPI otherwise 10 point review of systems negative.   Ambulatory Status: uses walker  Past Medical History:  Diagnosis Date  . Anemia   . Cataracts, bilateral   . CKD (chronic kidney disease), stage III   . Diabetes mellitus without complication (HCC)   . Distended bladder   . Diverticulosis    on scan  . Hard of hearing   . Hiatal hernia   . Hypertension   . Kidney stones   . Macular degeneration   . paroxysmal afib    right bundle branch block  . Syncopal episodes     Past Surgical History:    Procedure Laterality Date  . EYE SURGERY     cataracts with lens implants  . HERNIA REPAIR    . KIDNEY STONE SURGERY     since young age  . ORIF HUMERUS FRACTURE Right 06/26/2012   Procedure: OPEN REDUCTION INTERNAL FIXATION (ORIF) PROXIMAL HUMERUS FRACTURE, right ;  Surgeon: Budd Palmer, MD;  Location: [facility] OR;  Service: Orthopedics;  Laterality: Right;  . TRANSURETHRAL RESECTION OF PROSTATE N/A 06/30/2015   Procedure: TRANSURETHRAL RESECTION OF THE PROSTATE (TURP);  Surgeon: Crist Fat, MD;  Location: [facility] OR[facility];  Service: Urology;  Laterality: N/A;    Social History   Social History  . Marital status: Married    Spouse name: N/A  . Number of children: 3  . Years of education: N/A   Occupational History  . retired    Social History Main Topics  . Smoking status: Former Smoker    Types: Cigars    Quit date: 05/06/1969  . Smokeless tobacco: Never Used  . Alcohol use No  . Drug use: No  . Sexual activity: Not on file   Other Topics Concern  . Not on file   Social History Narrative  . No narrative on file    Allergies  Allergen Reactions  . Sulfa Antibiotics Itching    Family History  Problem Relation Age of Onset  . Aneurysm Father     Aortic aneurysm  . Macular degeneration Mother   .  Gallbladder disease Mother   . Pancreatitis Sister   . Macular degeneration Sister     x 4    Prior to Admission medications   Medication Sig Start Date End Date Taking? Authorizing Provider  alendronate (FOSAMAX) 70 MG tablet Take 70 mg by mouth once a week. Take with a full glass of water on an empty stomach. ( Sunday )   Yes Historical Provider, MD  amLODipine (NORVASC) 2.5 MG tablet Take 2.5 mg by mouth at bedtime.   Yes Historical Provider, MD  metFORMIN (GLUCOPHAGE-XR) 500 MG 24 hr tablet Take 500 mg by mouth daily with breakfast.   Yes Historical Provider, MD  traMADol (ULTRAM) 50 MG tablet Take 1 tablet (50 mg total) by mouth 3 (three) times daily as needed (for  pain). 06/30/15  Yes Crist FatBenjamin W Herrick, MD  docusate sodium (COLACE) 100 MG capsule Take 1 capsule (100 mg total) by mouth 2 (two) times daily as needed (take to keep stool soft.). Patient not taking: Reported on 05/25/2016 06/30/15   Crist FatBenjamin W Herrick, MD    Physical Exam: Vitals:   07/31/16 1045 07/31/16 1100 07/31/16 1330 07/31/16 1345  BP: (!) 153/69 (!) 147/91 (!) 147/78 (!) 165/90  Pulse: 72 79 79 81  Resp: (!) 21 (!) 22 12 19   Temp:      TempSrc:      SpO2: 98% 96% 96% 95%  Weight:      Height:         General: Appears calm and comfortable Eyes: PERRLA, EOMI, normal lids, iris ENT:  grossly normal hearing, lips & tongue, mucous membranes moist and intact Neck: no lymphoadenopathy, masses or thyromegaly Cardiovascular: RRR, no m/r/g. No JVD, carotid bruits. No LE edema.  Respiratory: bilateral no wheezes, rales, rhonchi or cracles. Normal respiratory effort. No accessory muscle use observed Abdomen: soft, non-tender, non-distended, no organomegaly or masses appreciated. BS present in all quadrants Skin: no rash, ulcers or induration seen on limited exam Musculoskeletal: grossly normal tone BUE/BLE, good ROM, no bony abnormality or joint deformities observed Psychiatric: grossly normal mood and affect, speech fluent and appropriate, alert and oriented x3 Neurologic: CN II-XII grossly intact, moves all extremities in coordinated fashion, no motor deficits deficit observed, sensation diminished on the right  Labs on Admission: I have personally reviewed following labs and imaging studies  CBC, BMP  GFR: Estimated Creatinine Clearance: 29.2 mL/min (A) (by C-G formula based on SCr of 1.4 mg/dL (H)).   Creatinine Clearance: Estimated Creatinine Clearance: 29.2 mL/min (A) (by C-G formula based on SCr of 1.4 mg/dL (H)).    Radiological Exams on Admission: Ct Angio Head W Or Wo Contrast  Result Date: 07/31/2016 CLINICAL DATA:  Aphasia.  Right arm and leg drift. EXAM: CT  ANGIOGRAPHY HEAD AND NECK TECHNIQUE: Multidetector CT imaging of the head and neck was performed using the standard protocol during bolus administration of intravenous contrast. Multiplanar CT image reconstructions and MIPs were obtained to evaluate the vascular anatomy. Carotid stenosis measurements (when applicable) are obtained utilizing NASCET criteria, using the distal internal carotid diameter as the denominator. CONTRAST:  50 mL Isovue 370 COMPARISON:  Head CT 07/31/2016 and MRI 02/07/2015. No prior angiographic imaging. FINDINGS: CTA NECK FINDINGS Aortic arch: Standard 3 vessel aortic arch with heavy calcified and irregular soft plaque projecting into the lumen. Non-stenotic plaque in the brachiocephalic and right subclavian arteries. Mild narrowing of the proximal left subclavian artery due to soft and calcified plaque. Right carotid system: Moderate soft and calcified plaque  in the mid to distal the common carotid artery without significant stenosis. Extensive, heavily calcified plaque at the bifurcation and carotid bulb results in approximately 50% proximal ICA stenosis. Left carotid system: Mild-to-moderate calcified and soft plaque in the mid to distal common carotid artery without significant stenosis. Eccentric, calcified plaque in the proximal ICA results in 30% stenosis. There is moderate proximal ECA stenosis due to soft plaque. Vertebral arteries: Patent with the left being slightly larger than the right. No evidence of dissection or significant stenosis. Slight irregularity of the V3 segments bilaterally for which mild fibromuscular dysplasia is not excluded. Skeleton: Multilevel cervical disc and facet degeneration. Interbody and posterior element ankylosis at C3-4, C4-5, and C6-7. Other neck: No mass or lymph node enlargement. Upper chest: Partially visualized mosaic attenuation in the left upper lobe, possibly air trapping. Review of the MIP images confirms the above findings CTA HEAD FINDINGS  Anterior circulation: The internal carotid arteries are patent from skullbase to carotid termini with diffuse siphon atherosclerosis bilaterally. There is mild cavernous and supraclinoid segment stenosis on the left. The MCAs are patent without evidence of major branch occlusion or significant proximal stenosis. There is an early MCA bifurcation on the left. There is mild asymmetric attenuation of distal left MCA branch vessels. The ACAs are patent without evidence of major branch occlusion or significant proximal stenosis. The left A1 segment is hypoplastic. No aneurysm. Posterior circulation: The intracranial vertebral arteries are patent with mild non stenotic calcified plaque in the left V4 segment. Patent PICA, AICA, and SCA origins are visualized bilaterally. The basilar artery is patent without stenosis. There is a prominent right posterior communicating artery with markedly hypoplastic or absent/ occluded right P1 segment. No significant PCA stenosis is identified. No aneurysm. Venous sinuses: Not well evaluated due to contrast timing. Anatomic variants: Fetal origin of the right PCA. Hypoplastic left A1. Delayed phase: Not performed. Review of the MIP images confirms the above findings IMPRESSION: 1. No emergent large vessel occlusion. 2. Advanced aortic atherosclerosis. 3. 50% proximal right ICA stenosis. 4. 30% proximal left ICA stenosis. 5. Mild intracranial left ICA stenosis. Preliminary findings of no large vessel occlusion were called by telephone at the time of interpretation on 07/31/2016 at 10:45 am to Dr. Amada Jupiter, who verbally acknowledged these results. Electronically Signed   By: Sebastian Ache M.D.   On: 07/31/2016 11:09   Ct Angio Neck W Or Wo Contrast  Result Date: 07/31/2016 CLINICAL DATA:  Aphasia.  Right arm and leg drift. EXAM: CT ANGIOGRAPHY HEAD AND NECK TECHNIQUE: Multidetector CT imaging of the head and neck was performed using the standard protocol during bolus administration of  intravenous contrast. Multiplanar CT image reconstructions and MIPs were obtained to evaluate the vascular anatomy. Carotid stenosis measurements (when applicable) are obtained utilizing NASCET criteria, using the distal internal carotid diameter as the denominator. CONTRAST:  50 mL Isovue 370 COMPARISON:  Head CT 07/31/2016 and MRI 02/07/2015. No prior angiographic imaging. FINDINGS: CTA NECK FINDINGS Aortic arch: Standard 3 vessel aortic arch with heavy calcified and irregular soft plaque projecting into the lumen. Non-stenotic plaque in the brachiocephalic and right subclavian arteries. Mild narrowing of the proximal left subclavian artery due to soft and calcified plaque. Right carotid system: Moderate soft and calcified plaque in the mid to distal the common carotid artery without significant stenosis. Extensive, heavily calcified plaque at the bifurcation and carotid bulb results in approximately 50% proximal ICA stenosis. Left carotid system: Mild-to-moderate calcified and soft plaque in the mid to  distal common carotid artery without significant stenosis. Eccentric, calcified plaque in the proximal ICA results in 30% stenosis. There is moderate proximal ECA stenosis due to soft plaque. Vertebral arteries: Patent with the left being slightly larger than the right. No evidence of dissection or significant stenosis. Slight irregularity of the V3 segments bilaterally for which mild fibromuscular dysplasia is not excluded. Skeleton: Multilevel cervical disc and facet degeneration. Interbody and posterior element ankylosis at C3-4, C4-5, and C6-7. Other neck: No mass or lymph node enlargement. Upper chest: Partially visualized mosaic attenuation in the left upper lobe, possibly air trapping. Review of the MIP images confirms the above findings CTA HEAD FINDINGS Anterior circulation: The internal carotid arteries are patent from skullbase to carotid termini with diffuse siphon atherosclerosis bilaterally. There is  mild cavernous and supraclinoid segment stenosis on the left. The MCAs are patent without evidence of major branch occlusion or significant proximal stenosis. There is an early MCA bifurcation on the left. There is mild asymmetric attenuation of distal left MCA branch vessels. The ACAs are patent without evidence of major branch occlusion or significant proximal stenosis. The left A1 segment is hypoplastic. No aneurysm. Posterior circulation: The intracranial vertebral arteries are patent with mild non stenotic calcified plaque in the left V4 segment. Patent PICA, AICA, and SCA origins are visualized bilaterally. The basilar artery is patent without stenosis. There is a prominent right posterior communicating artery with markedly hypoplastic or absent/ occluded right P1 segment. No significant PCA stenosis is identified. No aneurysm. Venous sinuses: Not well evaluated due to contrast timing. Anatomic variants: Fetal origin of the right PCA. Hypoplastic left A1. Delayed phase: Not performed. Review of the MIP images confirms the above findings IMPRESSION: 1. No emergent large vessel occlusion. 2. Advanced aortic atherosclerosis. 3. 50% proximal right ICA stenosis. 4. 30% proximal left ICA stenosis. 5. Mild intracranial left ICA stenosis. Preliminary findings of no large vessel occlusion were called by telephone at the time of interpretation on 07/31/2016 at 10:45 am to Dr. Amada Jupiter, who verbally acknowledged these results. Electronically Signed   By: Sebastian Ache M.D.   On: 07/31/2016 11:09   Ct Head Code Stroke Wo Contrast`  Result Date: 07/31/2016 CLINICAL DATA:  Code stroke.  Aphasia.  Right arm and leg drift. EXAM: CT HEAD WITHOUT CONTRAST TECHNIQUE: Contiguous axial images were obtained from the base of the skull through the vertex without intravenous contrast. COMPARISON:  Brain MRI 02/07/2015 FINDINGS: Brain: There is no evidence of acute intracranial hemorrhage, mass, midline shift, or extra-axial fluid  collection. There is age related cerebral atrophy. Patchy to confluent bilateral cerebral white matter hypodensities are nonspecific but compatible with moderate chronic small vessel ischemic disease. There is mildly asymmetric white matter hypoattenuation at the level of the posterior left corona radiata (series 201, image 21) with possible early loss of gray- white differentiation involving the overlying cortex. There is a chronic lacunar infarct in the right caudate nucleus. Vascular: Calcified atherosclerosis at the skullbase. No proximal dense MCA. Slightly increased density of a small MCA branch vessel posteriorly in the left sylvian fissure, which may represent a distal M2/ proximal M3 embolus versus atherosclerosis. Skull: No fracture or focal osseous lesion. Sinuses/Orbits: Bilateral cataract extraction. No significant sinus disease. Other: None. ASPECTS [facility] Stroke Program Early CT Score) - Ganglionic level infarction (caudate, lentiform nuclei, internal capsule, insula, M1-M3 cortex): 7 - Supraganglionic infarction (M4-M6 cortex): 2 Total score (0-10 with 10 being normal): 9 IMPRESSION: 1. No acute intracranial hemorrhage. 2. Possible early left  parietal lobe infarct. 3. ASPECTS is 9. 4. Moderate chronic small vessel ischemic disease. These results were called by telephone at the time of interpretation on 07/31/2016 at 10:30 am to Dr. Amada Jupiter, who verbally acknowledged these results. Electronically Signed   By: Sebastian Ache M.D.   On: 07/31/2016 10:39    EKG: Independently reviewed - simus rhythm, occasional PVC's, non-specific interventricular conduction delay, 1st degree AVB  Assessment/Plan Principal Problem:   Acute ischemic stroke [facility]) Active Problems:   Essential hypertension, benign   Diabetes mellitus without complication (HCC)   paroxysmal afib   CKD (chronic kidney disease), stage III    Acute ischemic stroke Patient was seen by neuro team Head CT is positive for acute  infarct and symptomatology is consistent with acute stroke Patient will be admitted to telemetry, started on full dose aspirin for secondary prophylaxis We'll check hemoglobin A1c and lipid panel  PT/OT/speech therapy will see patient   Hypertension Continue home meds with permissive hypertension up to 220/110 mmhg  Paroxysmal AFib - not on any anticoagulation therapy Patient will be placed on regular dose aspirin  DM type II - most recent HgbA1C is 6.4% on 06/22/16  Hold Metformin while hospitalized Continue diabetic diet, monitor FSBS QACHS, add sliding scale insulin    DVT prophylaxis: Heparin Code Status: DO NOT RESUSCITATE Family Communication: At bedside Disposition Plan: Telemetry Consults called: Neuro by EDP Admission status: Inpatient   Raymon Mutton, [place] Pager: [phone] [facility]  If 7PM-7AM, please contact night-coverage [email or web address] Password [facility]  07/31/2016, 2:11 PM

## 2016-07-31 NOTE — Progress Notes (Signed)
MD notified of patient arrival. 

## 2016-07-31 NOTE — ED Provider Notes (Signed)
[facility]-EMERGENCY DEPT Provider Note   CSN: [id number] Arrival date & time: 07/31/16  4098   An emergency department physician performed an initial assessment on this suspected stroke patient at 1010.  History   Chief Complaint Chief Complaint  Patient presents with  . Altered Mental Status    HPI Clayton [name] is a 83 y.o. male.  HPI Patient presents to the emergency department with difficulty speaking with aphasia.  The patient was noted to get up without difficulty.  This morning, get himself dressed and then the wife states that when he attempted to take his medications.  He was having difficulty and she attempted to speak with him and he was not able to respond to her.  EMS was called.  Patient is unable to give me any history.  The patient will follow some commands.  The wife states patient has had no fever, nausea, vomiting, weakness, dizziness, headache, blurred vision, chest pain, shortness of breath, abdominal pain, near syncope or syncope Past Medical History:  Diagnosis Date  . Anemia   . Cataracts, bilateral   . CKD (chronic kidney disease), stage III   . Diabetes mellitus without complication (HCC)   . Distended bladder   . Diverticulosis    on scan  . Hard of hearing   . Hiatal hernia   . Hypertension   . Kidney stones   . Macular degeneration   . paroxysmal afib    right bundle branch block  . Syncopal episodes     Patient Active Problem List   Diagnosis Date Noted  . Urinary retention 06/30/2015  . AKI (acute kidney injury) (HCC) 05/22/2015  . Acute renal failure superimposed on stage 3 chronic kidney disease (HCC) 02/07/2015  . Syncope 02/07/2015  . Normocytic anemia 02/07/2015  . Hyperkalemia 02/07/2015  . Diabetes mellitus without complication (HCC)   . paroxysmal afib   . Absolute anemia   . Faintness   . Absent pedal pulses 12/28/2014  . Essential hypertension, benign 06/27/2012  . Proximal humerus fracture, right 06/24/2012    Past  Surgical History:  Procedure Laterality Date  . EYE SURGERY     cataracts with lens implants  . HERNIA REPAIR    . KIDNEY STONE SURGERY     since young age  . ORIF HUMERUS FRACTURE Right 06/26/2012   Procedure: OPEN REDUCTION INTERNAL FIXATION (ORIF) PROXIMAL HUMERUS FRACTURE, right ;  Surgeon: Budd Palmer, MD;  Location: [facility] OR;  Service: Orthopedics;  Laterality: Right;  . TRANSURETHRAL RESECTION OF PROSTATE N/A 06/30/2015   Procedure: TRANSURETHRAL RESECTION OF THE PROSTATE (TURP);  Surgeon: Crist Fat, MD;  Location: [facility];  Service: Urology;  Laterality: N/A;       Home Medications    Prior to Admission medications   Medication Sig Start Date End Date Taking? Authorizing Provider  alendronate (FOSAMAX) 70 MG tablet Take 70 mg by mouth once a week. Take with a full glass of water on an empty stomach. ( Sunday )   Yes Historical Provider, MD  amLODipine (NORVASC) 2.5 MG tablet Take 2.5 mg by mouth at bedtime.   Yes Historical Provider, MD  metFORMIN (GLUCOPHAGE-XR) 500 MG 24 hr tablet Take 500 mg by mouth daily with breakfast.   Yes Historical Provider, MD  traMADol (ULTRAM) 50 MG tablet Take 1 tablet (50 mg total) by mouth 3 (three) times daily as needed (for pain). 06/30/15  Yes Crist Fat, MD  docusate sodium (COLACE) 100 MG capsule Take 1 capsule (  100 mg total) by mouth 2 (two) times daily as needed (take to keep stool soft.). Patient not taking: Reported on 05/25/2016 06/30/15   Crist FatBenjamin W Herrick, MD    Family History Family History  Problem Relation Age of Onset  . Aneurysm Father     Aortic aneurysm  . Macular degeneration Mother   . Gallbladder disease Mother   . Pancreatitis Sister   . Macular degeneration Sister     x 4    Social History Social History  Substance Use Topics  . Smoking status: Former Smoker    Types: Cigars    Quit date: 05/06/1969  . Smokeless tobacco: Never Used  . Alcohol use No     Allergies   Sulfa  antibiotics   Review of Systems Review of Systems Level V caveat applies due to aphasia and altered mental status  Physical Exam Updated Vital Signs BP (!) 155/82   Pulse 78   Temp 97.6 F (36.4 C)   Resp 14   Ht 5\' 11"  (1.803 m)   Wt 63.9 kg   SpO2 97%   BMI 19.65 kg/m   Physical Exam  Constitutional: He is oriented to person, place, and time. He appears well-developed and well-nourished. No distress.  HENT:  Head: Normocephalic and atraumatic.  Mouth/Throat: Oropharynx is clear and moist.  Eyes: Pupils are equal, round, and reactive to light.  Neck: Normal range of motion. Neck supple.  Cardiovascular: Normal rate, regular rhythm and normal heart sounds.  Exam reveals no gallop and no friction rub.   No murmur heard. Pulmonary/Chest: Effort normal and breath sounds normal. No respiratory distress. He has no wheezes.  Abdominal: Soft. Bowel sounds are normal. He exhibits no distension. There is no tenderness.  Neurological: He is alert and oriented to person, place, and time. He exhibits normal muscle tone. Coordination normal.  Patient is aphasic and not able to answer my questions appropriately  Skin: Skin is warm and dry. Capillary refill takes less than 2 seconds. No rash noted. No erythema.  Psychiatric: He has a normal mood and affect. His behavior is normal.  Nursing note and vitals reviewed.    ED Treatments / Results  Labs (all labs ordered are listed, but only abnormal results are displayed) Labs Reviewed  CBC - Abnormal; Notable for the following:       Result Value   RBC 4.21 (*)    All other components within normal limits  COMPREHENSIVE METABOLIC PANEL - Abnormal; Notable for the following:    Glucose, Bld 155 (*)    BUN 28 (*)    Creatinine, Ser 1.41 (*)    ALT 11 (*)    GFR calc non Af Amer 41 (*)    GFR calc Af Amer 48 (*)    All other components within normal limits  CBG MONITORING, ED - Abnormal; Notable for the following:     Glucose-Capillary 139 (*)    All other components within normal limits  I-STAT CHEM 8, ED - Abnormal; Notable for the following:    BUN 32 (*)    Creatinine, Ser 1.40 (*)    Glucose, Bld 154 (*)    All other components within normal limits  PROTIME-INR  APTT  DIFFERENTIAL  ETHANOL  URINALYSIS, ROUTINE W REFLEX MICROSCOPIC  I-STAT TROPOININ, ED  CBG MONITORING, ED    EKG  EKG Interpretation None       Radiology Ct Angio Head W Or Wo Contrast  Result Date: 07/31/2016 CLINICAL DATA:  Aphasia.  Right arm and leg drift. EXAM: CT ANGIOGRAPHY HEAD AND NECK TECHNIQUE: Multidetector CT imaging of the head and neck was performed using the standard protocol during bolus administration of intravenous contrast. Multiplanar CT image reconstructions and MIPs were obtained to evaluate the vascular anatomy. Carotid stenosis measurements (when applicable) are obtained utilizing NASCET criteria, using the distal internal carotid diameter as the denominator. CONTRAST:  50 mL Isovue 370 COMPARISON:  Head CT 07/31/2016 and MRI 02/07/2015. No prior angiographic imaging. FINDINGS: CTA NECK FINDINGS Aortic arch: Standard 3 vessel aortic arch with heavy calcified and irregular soft plaque projecting into the lumen. Non-stenotic plaque in the brachiocephalic and right subclavian arteries. Mild narrowing of the proximal left subclavian artery due to soft and calcified plaque. Right carotid system: Moderate soft and calcified plaque in the mid to distal the common carotid artery without significant stenosis. Extensive, heavily calcified plaque at the bifurcation and carotid bulb results in approximately 50% proximal ICA stenosis. Left carotid system: Mild-to-moderate calcified and soft plaque in the mid to distal common carotid artery without significant stenosis. Eccentric, calcified plaque in the proximal ICA results in 30% stenosis. There is moderate proximal ECA stenosis due to soft plaque. Vertebral arteries:  Patent with the left being slightly larger than the right. No evidence of dissection or significant stenosis. Slight irregularity of the V3 segments bilaterally for which mild fibromuscular dysplasia is not excluded. Skeleton: Multilevel cervical disc and facet degeneration. Interbody and posterior element ankylosis at C3-4, C4-5, and C6-7. Other neck: No mass or lymph node enlargement. Upper chest: Partially visualized mosaic attenuation in the left upper lobe, possibly air trapping. Review of the MIP images confirms the above findings CTA HEAD FINDINGS Anterior circulation: The internal carotid arteries are patent from skullbase to carotid termini with diffuse siphon atherosclerosis bilaterally. There is mild cavernous and supraclinoid segment stenosis on the left. The MCAs are patent without evidence of major branch occlusion or significant proximal stenosis. There is an early MCA bifurcation on the left. There is mild asymmetric attenuation of distal left MCA branch vessels. The ACAs are patent without evidence of major branch occlusion or significant proximal stenosis. The left A1 segment is hypoplastic. No aneurysm. Posterior circulation: The intracranial vertebral arteries are patent with mild non stenotic calcified plaque in the left V4 segment. Patent PICA, AICA, and SCA origins are visualized bilaterally. The basilar artery is patent without stenosis. There is a prominent right posterior communicating artery with markedly hypoplastic or absent/ occluded right P1 segment. No significant PCA stenosis is identified. No aneurysm. Venous sinuses: Not well evaluated due to contrast timing. Anatomic variants: Fetal origin of the right PCA. Hypoplastic left A1. Delayed phase: Not performed. Review of the MIP images confirms the above findings IMPRESSION: 1. No emergent large vessel occlusion. 2. Advanced aortic atherosclerosis. 3. 50% proximal right ICA stenosis. 4. 30% proximal left ICA stenosis. 5. Mild  intracranial left ICA stenosis. Preliminary findings of no large vessel occlusion were called by telephone at the time of interpretation on 07/31/2016 at 10:45 am to Dr. Amada Jupiter, who verbally acknowledged these results. Electronically Signed   By: Sebastian Ache M.D.   On: 07/31/2016 11:09   Ct Angio Neck W Or Wo Contrast  Result Date: 07/31/2016 CLINICAL DATA:  Aphasia.  Right arm and leg drift. EXAM: CT ANGIOGRAPHY HEAD AND NECK TECHNIQUE: Multidetector CT imaging of the head and neck was performed using the standard protocol during bolus administration of intravenous contrast. Multiplanar CT image reconstructions and MIPs were  obtained to evaluate the vascular anatomy. Carotid stenosis measurements (when applicable) are obtained utilizing NASCET criteria, using the distal internal carotid diameter as the denominator. CONTRAST:  50 mL Isovue 370 COMPARISON:  Head CT 07/31/2016 and MRI 02/07/2015. No prior angiographic imaging. FINDINGS: CTA NECK FINDINGS Aortic arch: Standard 3 vessel aortic arch with heavy calcified and irregular soft plaque projecting into the lumen. Non-stenotic plaque in the brachiocephalic and right subclavian arteries. Mild narrowing of the proximal left subclavian artery due to soft and calcified plaque. Right carotid system: Moderate soft and calcified plaque in the mid to distal the common carotid artery without significant stenosis. Extensive, heavily calcified plaque at the bifurcation and carotid bulb results in approximately 50% proximal ICA stenosis. Left carotid system: Mild-to-moderate calcified and soft plaque in the mid to distal common carotid artery without significant stenosis. Eccentric, calcified plaque in the proximal ICA results in 30% stenosis. There is moderate proximal ECA stenosis due to soft plaque. Vertebral arteries: Patent with the left being slightly larger than the right. No evidence of dissection or significant stenosis. Slight irregularity of the V3  segments bilaterally for which mild fibromuscular dysplasia is not excluded. Skeleton: Multilevel cervical disc and facet degeneration. Interbody and posterior element ankylosis at C3-4, C4-5, and C6-7. Other neck: No mass or lymph node enlargement. Upper chest: Partially visualized mosaic attenuation in the left upper lobe, possibly air trapping. Review of the MIP images confirms the above findings CTA HEAD FINDINGS Anterior circulation: The internal carotid arteries are patent from skullbase to carotid termini with diffuse siphon atherosclerosis bilaterally. There is mild cavernous and supraclinoid segment stenosis on the left. The MCAs are patent without evidence of major branch occlusion or significant proximal stenosis. There is an early MCA bifurcation on the left. There is mild asymmetric attenuation of distal left MCA branch vessels. The ACAs are patent without evidence of major branch occlusion or significant proximal stenosis. The left A1 segment is hypoplastic. No aneurysm. Posterior circulation: The intracranial vertebral arteries are patent with mild non stenotic calcified plaque in the left V4 segment. Patent PICA, AICA, and SCA origins are visualized bilaterally. The basilar artery is patent without stenosis. There is a prominent right posterior communicating artery with markedly hypoplastic or absent/ occluded right P1 segment. No significant PCA stenosis is identified. No aneurysm. Venous sinuses: Not well evaluated due to contrast timing. Anatomic variants: Fetal origin of the right PCA. Hypoplastic left A1. Delayed phase: Not performed. Review of the MIP images confirms the above findings IMPRESSION: 1. No emergent large vessel occlusion. 2. Advanced aortic atherosclerosis. 3. 50% proximal right ICA stenosis. 4. 30% proximal left ICA stenosis. 5. Mild intracranial left ICA stenosis. Preliminary findings of no large vessel occlusion were called by telephone at the time of interpretation on  07/31/2016 at 10:45 am to Dr. Amada Jupiter, who verbally acknowledged these results. Electronically Signed   By: Sebastian Ache M.D.   On: 07/31/2016 11:09   Ct Head Code Stroke Wo Contrast`  Result Date: 07/31/2016 CLINICAL DATA:  Code stroke.  Aphasia.  Right arm and leg drift. EXAM: CT HEAD WITHOUT CONTRAST TECHNIQUE: Contiguous axial images were obtained from the base of the skull through the vertex without intravenous contrast. COMPARISON:  Brain MRI 02/07/2015 FINDINGS: Brain: There is no evidence of acute intracranial hemorrhage, mass, midline shift, or extra-axial fluid collection. There is age related cerebral atrophy. Patchy to confluent bilateral cerebral white matter hypodensities are nonspecific but compatible with moderate chronic small vessel ischemic disease. There is mildly  asymmetric white matter hypoattenuation at the level of the posterior left corona radiata (series 201, image 21) with possible early loss of gray- white differentiation involving the overlying cortex. There is a chronic lacunar infarct in the right caudate nucleus. Vascular: Calcified atherosclerosis at the skullbase. No proximal dense MCA. Slightly increased density of a small MCA branch vessel posteriorly in the left sylvian fissure, which may represent a distal M2/ proximal M3 embolus versus atherosclerosis. Skull: No fracture or focal osseous lesion. Sinuses/Orbits: Bilateral cataract extraction. No significant sinus disease. Other: None. ASPECTS [facility] Stroke Program Early CT Score) - Ganglionic level infarction (caudate, lentiform nuclei, internal capsule, insula, M1-M3 cortex): 7 - Supraganglionic infarction (M4-M6 cortex): 2 Total score (0-10 with 10 being normal): 9 IMPRESSION: 1. No acute intracranial hemorrhage. 2. Possible early left parietal lobe infarct. 3. ASPECTS is 9. 4. Moderate chronic small vessel ischemic disease. These results were called by telephone at the time of interpretation on 07/31/2016 at 10:30 am  to Dr. Amada Jupiter, who verbally acknowledged these results. Electronically Signed   By: Sebastian Ache M.D.   On: 07/31/2016 10:39    Procedures Procedures (including critical care time)  Medications Ordered in ED Medications  iopamidol (ISOVUE-370) 76 % injection (50 mLs  Contrast Given 07/31/16 1025)     Initial Impression / Assessment and Plan / ED Course  I have reviewed the triage vital signs and the nursing notes.  Pertinent labs & imaging results that were available during my care of the patient were reviewed by me and considered in my medical decision making (see chart for details).   I called a code stroke on the patient due to the fact that his symptoms started this morning.  Patient was seen by neurology.  Patient will not be given any intervention but will be admitted to the hospital.  Final Clinical Impressions(s) / ED Diagnoses   Final diagnoses:  Aphasia    New Prescriptions New Prescriptions   No medications on file     Charlestine Night, PA-C 08/02/16 1608    Bethann Berkshire, MD 08/05/16 [phone]

## 2016-07-31 NOTE — Progress Notes (Signed)
Patient returned to unit from MRI. Patient and patient daughter updated on current treatment plan. RN will continue to monitor patient.

## 2016-07-31 NOTE — ED Notes (Signed)
Pt. Daughter concerned that patient hasn't been given ASA. Stroke team notified. Hospitialist to place orders.

## 2016-07-31 NOTE — Progress Notes (Signed)
Patient has arrived to floor. Family at bedside

## 2016-07-31 NOTE — ED Notes (Signed)
EDP states wife told him patient was normal this morning. Code stroke called at this time.

## 2016-07-31 NOTE — ED Notes (Signed)
Pt's had issues urinating into urinal, notified Erin(RN)

## 2016-08-01 ENCOUNTER — Inpatient Hospital Stay (HOSPITAL_COMMUNITY): Payer: Medicare (Managed Care)

## 2016-08-01 DIAGNOSIS — I10 Essential (primary) hypertension: Secondary | ICD-10-CM

## 2016-08-01 DIAGNOSIS — R269 Unspecified abnormalities of gait and mobility: Secondary | ICD-10-CM

## 2016-08-01 DIAGNOSIS — I4891 Unspecified atrial fibrillation: Secondary | ICD-10-CM

## 2016-08-01 DIAGNOSIS — I69391 Dysphagia following cerebral infarction: Secondary | ICD-10-CM

## 2016-08-01 DIAGNOSIS — I700 Atherosclerosis of aorta: Secondary | ICD-10-CM

## 2016-08-01 DIAGNOSIS — R4701 Aphasia: Secondary | ICD-10-CM

## 2016-08-01 DIAGNOSIS — I63412 Cerebral infarction due to embolism of left middle cerebral artery: Principal | ICD-10-CM

## 2016-08-01 DIAGNOSIS — I69398 Other sequelae of cerebral infarction: Secondary | ICD-10-CM

## 2016-08-01 LAB — GLUCOSE, CAPILLARY
GLUCOSE-CAPILLARY: 120 mg/dL — AB (ref 65–99)
GLUCOSE-CAPILLARY: 124 mg/dL — AB (ref 65–99)
GLUCOSE-CAPILLARY: 125 mg/dL — AB (ref 65–99)
Glucose-Capillary: 138 mg/dL — ABNORMAL HIGH (ref 65–99)
Glucose-Capillary: 128 mg/dL — ABNORMAL HIGH (ref 65–99)
Glucose-Capillary: 126 mg/dL — ABNORMAL HIGH (ref 65–99)
Glucose-Capillary: 141 mg/dL — ABNORMAL HIGH (ref 65–99)

## 2016-08-01 LAB — LIPID PANEL
CHOL/HDL RATIO: 3.9 ratio
CHOLESTEROL: 158 mg/dL (ref 0–200)
HDL: 41 mg/dL (ref >40)
LDL Cholesterol: 90 mg/dL (ref 0–99)
TRIGLYCERIDES: 134 mg/dL (ref <150)
VLDL: 27 mg/dL (ref 0–40)

## 2016-08-01 LAB — CBC
HCT: 38.2 % — ABNORMAL LOW (ref 39.0–52.0)
Hemoglobin: 12.5 g/dL — ABNORMAL LOW (ref 13.0–17.0)
MCH: 31.6 pg (ref 26.0–34.0)
MCHC: 32.7 g/dL (ref 30.0–36.0)
MCV: 96.5 fL (ref 78.0–100.0)
PLATELETS: 219 10*3/uL (ref 150–400)
RBC: 3.96 MIL/uL — AB (ref 4.22–5.81)
RDW: 13.1 % (ref 11.5–15.5)
WBC: 9.6 10*3/uL (ref 4.0–10.5)

## 2016-08-01 LAB — BASIC METABOLIC PANEL
ANION GAP: 10 (ref 5–15)
BUN: 24 mg/dL — ABNORMAL HIGH (ref 6–20)
CALCIUM: 9 mg/dL (ref 8.9–10.3)
CO2: 24 mmol/L (ref 22–32)
Chloride: 106 mmol/L (ref 101–111)
Creatinine, Ser: 1.3 mg/dL — ABNORMAL HIGH (ref 0.61–1.24)
GFR, EST AFRICAN AMERICAN: 52 mL/min — AB (ref >60)
GFR, EST NON AFRICAN AMERICAN: 45 mL/min — AB (ref >60)
Glucose, Bld: 122 mg/dL — ABNORMAL HIGH (ref 65–99)
POTASSIUM: 4.1 mmol/L (ref 3.5–5.1)
SODIUM: 140 mmol/L (ref 135–145)

## 2016-08-01 LAB — ECHOCARDIOGRAM COMPLETE
Height: 71 in
WEIGHTICAEL: 2253.98 [oz_av]

## 2016-08-01 MED ORDER — ATORVASTATIN CALCIUM 10 MG PO TABS
20.0000 mg | ORAL_TABLET | Freq: Every day | ORAL | Status: DC
  Administered 2016-08-01 – 2016-08-05 (×5): 20 mg via ORAL
  Filled 2016-08-01 (×5): qty 2

## 2016-08-01 NOTE — Progress Notes (Signed)
Modified Barium Swallow Progress Note  Patient Details  Name: Clayton [name] MRN: [id number] Date of Birth: Jul 07, 1919  Today's Date: 08/01/2016  Modified Barium Swallow completed.  Full report located under Chart Review in the Imaging Section.  Brief recommendations include the following:  Clinical Impression  Clayton [name] demonstrates a  severe dysphagia with significant oral deficits and pharyngeal sensorimotor weakness that will make feeding difficult and result in micro-aspiration events with all consistencies. Pt is awake and alert and able to self feed, but initiation of lingual movement for bolus formation and propulsion for swallow is poor; Pt holds bolus, spread through out oral cavity, for a pronged period and lets trace amounts spill to the vallculae in piecemeal fashion. This triggers a swallow reponse though it is ineffecitve as the majority of the bolus is typically still in the oral cavity. The pt did improve with oral function with thicker boluses; puree and honey were best managed, though buccal residuals were still severe due to CN VII weakness. Base of tongue strength and laryngeal elevation were also decreased, leaving moderate to severe residuals post swallow. Thin and nectar thick liquids were silently aspirated after the swallow. Again with honey and puree, the pt triggered more effective uncued multiple swallows and cleared most of the bolus and residue. Risk of aspiration even with a modified honey and puree texture will still be high and given lack of sensation and weak cough , thickened aspirate may be more difficult to eventually expectorate and worsen outcomes.  A water protocol may be more agreeable to pt and family QOL. For now, will discuss options with pt and family, but will initaite ice chips after oral care to see if oral manipulation and initaition of swallow may improve with swallowing opportunities.    Swallow Evaluation Recommendations       SLP Diet  Recommendations: NPO;Ice chips PRN after oral care;NPO except meds       Medication Administration: Crushed with puree   Supervision: Full supervision/cueing for compensatory strategies       Postural Changes: Remain semi-upright after after feeds/meals (Comment);Seated upright at 90 degrees   Oral Care Recommendations: Oral care QID;Oral care before and after PO       Harlon DittyBonnie Cornelia Walraven, MA CCC-SLP [phone]  See Beharry, Clayton NearingBonnie [name] 08/01/2016,10:44 AM

## 2016-08-01 NOTE — Care Management Note (Signed)
Case Management Note  Patient Details  Name: Clayton SkyeWilliam G Somes MRN: [id number] Date of Birth: 1919/08/05  Subjective/Objective:    Pt admitted with CVA. He is from home with his spouse.                 Action/Plan: PT recommending CIR. Awaiting OT recommendations. CM following for discharge disposition.   Expected Discharge Date:                  Expected Discharge Plan:  IP Rehab Facility  In-House Referral:     Discharge planning Services     Post Acute Care Choice:    Choice offered to:     DME Arranged:    DME Agency:     HH Arranged:    HH Agency:     Status of Service:  In process, will continue to follow  If discussed at Long Length of Stay Meetings, dates discussed:    Additional Comments:  Kermit BaloKelli F Suad Autrey, RN 08/01/2016, 12:04 PM

## 2016-08-01 NOTE — Progress Notes (Signed)
Rehab Admissions Coordinator Note:  Patient was screened by Trish MageLogue, Qamar Aughenbaugh M for appropriateness for an Inpatient Acute Rehab Consult.  At this time, we are recommending Inpatient Rehab consult.  Lelon FrohlichLogue, Grayton Lobo M 08/01/2016, 11:54 AM  I can be reached at [phone].

## 2016-08-01 NOTE — Care Management Note (Signed)
Case Management Note  Patient Details  Name: Clayton SkyeWilliam G Deschamps MRN: [id number] Date of Birth: [birth year]  Subjective/Objective:           Patient was admitted with CVA. Lives at home with spouse. CM will follow for discharge needs pending therapy evals and physician orders.          Action/Plan:   Expected Discharge Date:                  Expected Discharge Plan:     In-House Referral:     Discharge planning Services     Post Acute Care Choice:    Choice offered to:     DME Arranged:    DME Agency:     HH Arranged:    HH Agency:     Status of Service:     If discussed at MicrosoftLong Length of Stay Meetings, dates discussed:    Additional Comments:  Anda KraftRobarge, Khayman Kirsch C, RN 08/01/2016, 10:07 AM

## 2016-08-01 NOTE — Progress Notes (Signed)
  Speech Language Pathology Treatment: Dysphagia  Patient Details Name: Clayton SkyeWilliam G [name] MRN: [id number] DOB: December 31, 1919 Today's Date: 08/01/2016 Time:  -     Assessment / Plan / Recommendation Clinical Impression  Provided f/u with pt family regarding results of MBS. Informed family of risk of aspiration with all textures and benefits of aspiration of water over thickened textures. Pt and family agreeable to water protocol with known risk of aspiration. Provided instruction in oral care and demonstrated safest feeding methods. Also initiated brief trial of chin tuck again resistance. Will f/u tomorrow for SLE and further trials.   HPI HPI: 83 y.o. male with baseline dysarthria and hx DMII, HTN, arrived to ED from home with aphasia, right-sided weakness.  Head CT early left parietal CVA. Hx of dysphagia, hence failed RN stroke swallow screen. [facility] - clinical swallow evaluation completed and pureed diet, thin liquids recommended.  Pt had OP SLP services upon return home.        SLP Plan  Continue with current plan of care       Recommendations  Diet recommendations: Dysphagia 1 (puree);Thin liquid Liquids provided via: Cup Medication Administration: Crushed with puree Supervision: Full supervision/cueing for compensatory strategies Compensations: Slow rate;Small sips/bites;Minimize environmental distractions;Multiple dry swallows after each bite/sip;Clear throat intermittently Postural Changes and/or Swallow Maneuvers: Seated upright 90 degrees;Upright 30-60 min after meal                Oral Care Recommendations: Oral care prior to ice chip/H20 SLP Visit Diagnosis: Dysphagia, oropharyngeal phase (R13.12) Plan: Continue with current plan of care       GO               [facility] Surgery Center LLCBonnie Quentez Lober, MA CCC-SLP [phone]  Claudine MoutonDeBlois, Sharay Bellissimo Caroline 08/01/2016, 3:15 PM

## 2016-08-01 NOTE — Progress Notes (Signed)
[facility] PROGRESS NOTE  Clayton [name]:[id number] DOB: [birth year] DOA: 07/31/2016   PCP: Gwen Pounds, MD     Assessment/Plan: Principal Problem:   Acute ischemic stroke (HCC) Active Problems:   Essential hypertension, benign   Diabetes mellitus without complication (HCC)   paroxysmal afib   CKD (chronic kidney disease), stage III   83 y.o. male with medical history significant of DM type II,  CKD, HTN, PAF, anemia who presented to the ED via EMS with c/o slurred speech and right sided weakness. Admitted for stroke workup. MRI positive for stroke  Assessment and plan Acute ischemic stroke Patient was seen by neuro team MRI Acute multifocal small LEFT frontal lobe infarcts (MCA territory) CT head and neck- No emergent large vessel occlusion Continue aspirin, stroke team following and will reassess need for antiplatelet rx  We'll check hemoglobin A1c  LDL 90, HDL 41, triglycerides 134 PT/OT/speech therapy pending 2-D echo pending Patient has a history of paroxysmal atrial fibrillation on anticoagulation at home?  Hypertension Continue home meds with permissive hypertension up to 220/110 mmhg  Paroxysmal AFib - not on any anticoagulation therapy Patient will be placed on regular dose aspirin  DM type II - most recent HgbA1C is 6.4% on 06/22/16  Hold Metformin while hospitalized Continue diabetic diet, monitor FSBS QACHS, add sliding scale insulin  UTI Continue Rocephin and follow urine culture   DVT prophylaxsis heparin  Code Status:  DNR     Family Communication: Called patient's daughter and wife and discussed current findings with them Disposition Plan:  As above     Consultants:  Neurology  Procedures:  None  Antibiotics: Anti-infectives    Start     Dose/Rate Route Frequency Ordered Stop   07/31/16 1900  cefTRIAXone (ROCEPHIN) 1 g in dextrose 5 % 50 mL IVPB     1 g 100 mL/hr over 30 Minutes Intravenous Every 24 hours  07/31/16 1845           HPI/Subjective: Confused , unable to state any sx  Objective: Vitals:   08/01/16 0138 08/01/16 0330 08/01/16 0540 08/01/16 0932  BP: (!) 141/79 (!) 143/79 (!) 141/86 (!) 168/73  Pulse: 67 62 65 70  Resp: 18  20 20   Temp:   97.5 F (36.4 C) 97.8 F (36.6 C)  TempSrc:   Oral Oral  SpO2: 96% 93% 96% 94%  Weight:      Height:        Intake/Output Summary (Last 24 hours) at 08/01/16 1009 Last data filed at 08/01/16 0933  Gross per 24 hour  Intake              875 ml  Output             1150 ml  Net             -275 ml    Exam:  Examination:  General exam: Appears calm and comfortable  Respiratory system: Clear to auscultation. Respiratory effort normal. Cardiovascular system: S1 & S2 heard, RRR. No JVD, murmurs, rubs, gallops or clicks. No pedal edema. Gastrointestinal system: Abdomen is nondistended, soft and nontender. No organomegaly or masses felt. Normal bowel sounds heard. Central nervous system: Awake,expressive aphasia Extremities: Symmetric 5 x 5 power. Skin: No rashes, lesions or ulcers Psychiatry: Judgement impaired.     Data Reviewed: I have personally reviewed following labs and imaging studies  Micro Results No results found for this or any previous visit (from the  past 240 hour(s)).  Radiology Reports Ct Angio Head W Or Wo Contrast  Result Date: 07/31/2016 CLINICAL DATA:  Aphasia.  Right arm and leg drift. EXAM: CT ANGIOGRAPHY HEAD AND NECK TECHNIQUE: Multidetector CT imaging of the head and neck was performed using the standard protocol during bolus administration of intravenous contrast. Multiplanar CT image reconstructions and MIPs were obtained to evaluate the vascular anatomy. Carotid stenosis measurements (when applicable) are obtained utilizing NASCET criteria, using the distal internal carotid diameter as the denominator. CONTRAST:  50 mL Isovue 370 COMPARISON:  Head CT 07/31/2016 and MRI 02/07/2015. No prior  angiographic imaging. FINDINGS: CTA NECK FINDINGS Aortic arch: Standard 3 vessel aortic arch with heavy calcified and irregular soft plaque projecting into the lumen. Non-stenotic plaque in the brachiocephalic and right subclavian arteries. Mild narrowing of the proximal left subclavian artery due to soft and calcified plaque. Right carotid system: Moderate soft and calcified plaque in the mid to distal the common carotid artery without significant stenosis. Extensive, heavily calcified plaque at the bifurcation and carotid bulb results in approximately 50% proximal ICA stenosis. Left carotid system: Mild-to-moderate calcified and soft plaque in the mid to distal common carotid artery without significant stenosis. Eccentric, calcified plaque in the proximal ICA results in 30% stenosis. There is moderate proximal ECA stenosis due to soft plaque. Vertebral arteries: Patent with the left being slightly larger than the right. No evidence of dissection or significant stenosis. Slight irregularity of the V3 segments bilaterally for which mild fibromuscular dysplasia is not excluded. Skeleton: Multilevel cervical disc and facet degeneration. Interbody and posterior element ankylosis at C3-4, C4-5, and C6-7. Other neck: No mass or lymph node enlargement. Upper chest: Partially visualized mosaic attenuation in the left upper lobe, possibly air trapping. Review of the MIP images confirms the above findings CTA HEAD FINDINGS Anterior circulation: The internal carotid arteries are patent from skullbase to carotid termini with diffuse siphon atherosclerosis bilaterally. There is mild cavernous and supraclinoid segment stenosis on the left. The MCAs are patent without evidence of major branch occlusion or significant proximal stenosis. There is an early MCA bifurcation on the left. There is mild asymmetric attenuation of distal left MCA branch vessels. The ACAs are patent without evidence of major branch occlusion or significant  proximal stenosis. The left A1 segment is hypoplastic. No aneurysm. Posterior circulation: The intracranial vertebral arteries are patent with mild non stenotic calcified plaque in the left V4 segment. Patent PICA, AICA, and SCA origins are visualized bilaterally. The basilar artery is patent without stenosis. There is a prominent right posterior communicating artery with markedly hypoplastic or absent/ occluded right P1 segment. No significant PCA stenosis is identified. No aneurysm. Venous sinuses: Not well evaluated due to contrast timing. Anatomic variants: Fetal origin of the right PCA. Hypoplastic left A1. Delayed phase: Not performed. Review of the MIP images confirms the above findings IMPRESSION: 1. No emergent large vessel occlusion. 2. Advanced aortic atherosclerosis. 3. 50% proximal right ICA stenosis. 4. 30% proximal left ICA stenosis. 5. Mild intracranial left ICA stenosis. Preliminary findings of no large vessel occlusion were called by telephone at the time of interpretation on 07/31/2016 at 10:45 am to Dr. Amada Jupiter, who verbally acknowledged these results. Electronically Signed   By: Sebastian Ache M.D.   On: 07/31/2016 11:09   Ct Angio Neck W Or Wo Contrast  Result Date: 07/31/2016 CLINICAL DATA:  Aphasia.  Right arm and leg drift. EXAM: CT ANGIOGRAPHY HEAD AND NECK TECHNIQUE: Multidetector CT imaging of the head and  neck was performed using the standard protocol during bolus administration of intravenous contrast. Multiplanar CT image reconstructions and MIPs were obtained to evaluate the vascular anatomy. Carotid stenosis measurements (when applicable) are obtained utilizing NASCET criteria, using the distal internal carotid diameter as the denominator. CONTRAST:  50 mL Isovue 370 COMPARISON:  Head CT 07/31/2016 and MRI 02/07/2015. No prior angiographic imaging. FINDINGS: CTA NECK FINDINGS Aortic arch: Standard 3 vessel aortic arch with heavy calcified and irregular soft plaque projecting  into the lumen. Non-stenotic plaque in the brachiocephalic and right subclavian arteries. Mild narrowing of the proximal left subclavian artery due to soft and calcified plaque. Right carotid system: Moderate soft and calcified plaque in the mid to distal the common carotid artery without significant stenosis. Extensive, heavily calcified plaque at the bifurcation and carotid bulb results in approximately 50% proximal ICA stenosis. Left carotid system: Mild-to-moderate calcified and soft plaque in the mid to distal common carotid artery without significant stenosis. Eccentric, calcified plaque in the proximal ICA results in 30% stenosis. There is moderate proximal ECA stenosis due to soft plaque. Vertebral arteries: Patent with the left being slightly larger than the right. No evidence of dissection or significant stenosis. Slight irregularity of the V3 segments bilaterally for which mild fibromuscular dysplasia is not excluded. Skeleton: Multilevel cervical disc and facet degeneration. Interbody and posterior element ankylosis at C3-4, C4-5, and C6-7. Other neck: No mass or lymph node enlargement. Upper chest: Partially visualized mosaic attenuation in the left upper lobe, possibly air trapping. Review of the MIP images confirms the above findings CTA HEAD FINDINGS Anterior circulation: The internal carotid arteries are patent from skullbase to carotid termini with diffuse siphon atherosclerosis bilaterally. There is mild cavernous and supraclinoid segment stenosis on the left. The MCAs are patent without evidence of major branch occlusion or significant proximal stenosis. There is an early MCA bifurcation on the left. There is mild asymmetric attenuation of distal left MCA branch vessels. The ACAs are patent without evidence of major branch occlusion or significant proximal stenosis. The left A1 segment is hypoplastic. No aneurysm. Posterior circulation: The intracranial vertebral arteries are patent with mild non  stenotic calcified plaque in the left V4 segment. Patent PICA, AICA, and SCA origins are visualized bilaterally. The basilar artery is patent without stenosis. There is a prominent right posterior communicating artery with markedly hypoplastic or absent/ occluded right P1 segment. No significant PCA stenosis is identified. No aneurysm. Venous sinuses: Not well evaluated due to contrast timing. Anatomic variants: Fetal origin of the right PCA. Hypoplastic left A1. Delayed phase: Not performed. Review of the MIP images confirms the above findings IMPRESSION: 1. No emergent large vessel occlusion. 2. Advanced aortic atherosclerosis. 3. 50% proximal right ICA stenosis. 4. 30% proximal left ICA stenosis. 5. Mild intracranial left ICA stenosis. Preliminary findings of no large vessel occlusion were called by telephone at the time of interpretation on 07/31/2016 at 10:45 am to Dr. Amada Jupiter, who verbally acknowledged these results. Electronically Signed   By: Sebastian Ache M.D.   On: 07/31/2016 11:09   Mr Brain Wo Contrast  Result Date: 07/31/2016 CLINICAL DATA:  Slurred speech and RIGHT-sided weakness, last seen normal last night. History of diabetes, hypertension. EXAM: MRI HEAD WITHOUT CONTRAST TECHNIQUE: Multiplanar, multiecho pulse sequences of the brain and surrounding structures were obtained without intravenous contrast. COMPARISON:  CTA HEAD and neck July 31, 2016 at 1034 hours. FINDINGS: BRAIN: Faint patchy reduced diffusion LEFT frontal lobe including the operculum. Corresponding low ADC values with faint  susceptibility artifact LEFT frontal cortex. Moderate to severe ventriculomegaly on the basis of global parenchymal brain volume loss. Confluent supratentorial white matter FLAIR T2 hyperintensities. No midline shift, mass effect or masses. Old RIGHT caudate head lacunar infarct. No abnormal extra-axial fluid collections. VASCULAR: Major intracranial vascular flow voids present at skull base,  dolichoectasia associated with chronic hypertension. SKULL AND UPPER CERVICAL SPINE: No abnormal sellar expansion. No suspicious calvarial bone marrow signal. Craniocervical junction maintained. SINUSES/ORBITS: The mastoid air-cells and included paranasal sinuses are well-aerated. The included ocular globes and orbital contents are non-suspicious. Status post bilateral ocular lens implants. OTHER: None. IMPRESSION: Acute multifocal small LEFT frontal lobe infarcts (MCA territory) with faint petechial hemorrhage. Chronic changes include old RIGHT caudate head lacunar infarct and moderate chronic small vessel ischemic disease. Electronically Signed   By: Awilda Metro M.D.   On: 07/31/2016 19:57   Ct Head Code Stroke Wo Contrast`  Result Date: 07/31/2016 CLINICAL DATA:  Code stroke.  Aphasia.  Right arm and leg drift. EXAM: CT HEAD WITHOUT CONTRAST TECHNIQUE: Contiguous axial images were obtained from the base of the skull through the vertex without intravenous contrast. COMPARISON:  Brain MRI 02/07/2015 FINDINGS: Brain: There is no evidence of acute intracranial hemorrhage, mass, midline shift, or extra-axial fluid collection. There is age related cerebral atrophy. Patchy to confluent bilateral cerebral white matter hypodensities are nonspecific but compatible with moderate chronic small vessel ischemic disease. There is mildly asymmetric white matter hypoattenuation at the level of the posterior left corona radiata (series 201, image 21) with possible early loss of gray- white differentiation involving the overlying cortex. There is a chronic lacunar infarct in the right caudate nucleus. Vascular: Calcified atherosclerosis at the skullbase. No proximal dense MCA. Slightly increased density of a small MCA branch vessel posteriorly in the left sylvian fissure, which may represent a distal M2/ proximal M3 embolus versus atherosclerosis. Skull: No fracture or focal osseous lesion. Sinuses/Orbits: Bilateral  cataract extraction. No significant sinus disease. Other: None. ASPECTS [facility] Stroke Program Early CT Score) - Ganglionic level infarction (caudate, lentiform nuclei, internal capsule, insula, M1-M3 cortex): 7 - Supraganglionic infarction (M4-M6 cortex): 2 Total score (0-10 with 10 being normal): 9 IMPRESSION: 1. No acute intracranial hemorrhage. 2. Possible early left parietal lobe infarct. 3. ASPECTS is 9. 4. Moderate chronic small vessel ischemic disease. These results were called by telephone at the time of interpretation on 07/31/2016 at 10:30 am to Dr. Amada Jupiter, who verbally acknowledged these results. Electronically Signed   By: Sebastian Ache M.D.   On: 07/31/2016 10:39     CBC  Recent Labs Lab 07/31/16 0952 07/31/16 1010 08/01/16 0526  WBC 9.2  --  9.6  HGB 13.3 13.6 12.5*  HCT 40.5 40.0 38.2*  PLT 211  --  219  MCV 96.2  --  96.5  MCH 31.6  --  31.6  MCHC 32.8  --  32.7  RDW 13.2  --  13.1  LYMPHSABS 1.3  --   --   MONOABS 0.7  --   --   EOSABS 0.5  --   --   BASOSABS 0.1  --   --     Chemistries   Recent Labs Lab 07/31/16 0952 07/31/16 1010 08/01/16 0526  NA 138 139 140  K 4.5 4.5 4.1  CL 102 103 106  CO2 25  --  24  GLUCOSE 155* 154* 122*  BUN 28* 32* 24*  CREATININE 1.41* 1.40* 1.30*  CALCIUM 9.6  --  9.0  AST  18  --   --   ALT 11*  --   --   ALKPHOS 78  --   --   BILITOT 0.7  --   --    ------------------------------------------------------------------------------------------------------------------ estimated creatinine clearance is 31.4 mL/min (A) (by C-G formula based on SCr of 1.3 mg/dL (H)). ------------------------------------------------------------------------------------------------------------------ No results for input(s): HGBA1C in the last 72 hours. ------------------------------------------------------------------------------------------------------------------  Recent Labs  08/01/16 0526  CHOL 158  HDL 41  LDLCALC 90  TRIG 134   CHOLHDL 3.9   ------------------------------------------------------------------------------------------------------------------ No results for input(s): TSH, T4TOTAL, T3FREE, THYROIDAB in the last 72 hours.  Invalid input(s): FREET3 ------------------------------------------------------------------------------------------------------------------ No results for input(s): VITAMINB12, FOLATE, FERRITIN, TIBC, IRON, RETICCTPCT in the last 72 hours.  Coagulation profile  Recent Labs Lab 07/31/16 0952  INR 1.02    No results for input(s): DDIMER in the last 72 hours.  Cardiac Enzymes No results for input(s): CKMB, TROPONINI, MYOGLOBIN in the last 168 hours.  Invalid input(s): CK ------------------------------------------------------------------------------------------------------------------ Invalid input(s): POCBNP   CBG:  Recent Labs Lab 07/31/16 0935 07/31/16 2138 08/01/16 0006 08/01/16 0409 08/01/16 0730  GLUCAP 139* 120* 125* 138* 124*       Studies: Ct Angio Head W Or Wo Contrast  Result Date: 07/31/2016 CLINICAL DATA:  Aphasia.  Right arm and leg drift. EXAM: CT ANGIOGRAPHY HEAD AND NECK TECHNIQUE: Multidetector CT imaging of the head and neck was performed using the standard protocol during bolus administration of intravenous contrast. Multiplanar CT image reconstructions and MIPs were obtained to evaluate the vascular anatomy. Carotid stenosis measurements (when applicable) are obtained utilizing NASCET criteria, using the distal internal carotid diameter as the denominator. CONTRAST:  50 mL Isovue 370 COMPARISON:  Head CT 07/31/2016 and MRI 02/07/2015. No prior angiographic imaging. FINDINGS: CTA NECK FINDINGS Aortic arch: Standard 3 vessel aortic arch with heavy calcified and irregular soft plaque projecting into the lumen. Non-stenotic plaque in the brachiocephalic and right subclavian arteries. Mild narrowing of the proximal left subclavian artery due to soft and  calcified plaque. Right carotid system: Moderate soft and calcified plaque in the mid to distal the common carotid artery without significant stenosis. Extensive, heavily calcified plaque at the bifurcation and carotid bulb results in approximately 50% proximal ICA stenosis. Left carotid system: Mild-to-moderate calcified and soft plaque in the mid to distal common carotid artery without significant stenosis. Eccentric, calcified plaque in the proximal ICA results in 30% stenosis. There is moderate proximal ECA stenosis due to soft plaque. Vertebral arteries: Patent with the left being slightly larger than the right. No evidence of dissection or significant stenosis. Slight irregularity of the V3 segments bilaterally for which mild fibromuscular dysplasia is not excluded. Skeleton: Multilevel cervical disc and facet degeneration. Interbody and posterior element ankylosis at C3-4, C4-5, and C6-7. Other neck: No mass or lymph node enlargement. Upper chest: Partially visualized mosaic attenuation in the left upper lobe, possibly air trapping. Review of the MIP images confirms the above findings CTA HEAD FINDINGS Anterior circulation: The internal carotid arteries are patent from skullbase to carotid termini with diffuse siphon atherosclerosis bilaterally. There is mild cavernous and supraclinoid segment stenosis on the left. The MCAs are patent without evidence of major branch occlusion or significant proximal stenosis. There is an early MCA bifurcation on the left. There is mild asymmetric attenuation of distal left MCA branch vessels. The ACAs are patent without evidence of major branch occlusion or significant proximal stenosis. The left A1 segment is hypoplastic. No aneurysm. Posterior circulation: The intracranial vertebral arteries  are patent with mild non stenotic calcified plaque in the left V4 segment. Patent PICA, AICA, and SCA origins are visualized bilaterally. The basilar artery is patent without stenosis.  There is a prominent right posterior communicating artery with markedly hypoplastic or absent/ occluded right P1 segment. No significant PCA stenosis is identified. No aneurysm. Venous sinuses: Not well evaluated due to contrast timing. Anatomic variants: Fetal origin of the right PCA. Hypoplastic left A1. Delayed phase: Not performed. Review of the MIP images confirms the above findings IMPRESSION: 1. No emergent large vessel occlusion. 2. Advanced aortic atherosclerosis. 3. 50% proximal right ICA stenosis. 4. 30% proximal left ICA stenosis. 5. Mild intracranial left ICA stenosis. Preliminary findings of no large vessel occlusion were called by telephone at the time of interpretation on 07/31/2016 at 10:45 am to Dr. Amada Jupiter, who verbally acknowledged these results. Electronically Signed   By: Sebastian Ache M.D.   On: 07/31/2016 11:09   Ct Angio Neck W Or Wo Contrast  Result Date: 07/31/2016 CLINICAL DATA:  Aphasia.  Right arm and leg drift. EXAM: CT ANGIOGRAPHY HEAD AND NECK TECHNIQUE: Multidetector CT imaging of the head and neck was performed using the standard protocol during bolus administration of intravenous contrast. Multiplanar CT image reconstructions and MIPs were obtained to evaluate the vascular anatomy. Carotid stenosis measurements (when applicable) are obtained utilizing NASCET criteria, using the distal internal carotid diameter as the denominator. CONTRAST:  50 mL Isovue 370 COMPARISON:  Head CT 07/31/2016 and MRI 02/07/2015. No prior angiographic imaging. FINDINGS: CTA NECK FINDINGS Aortic arch: Standard 3 vessel aortic arch with heavy calcified and irregular soft plaque projecting into the lumen. Non-stenotic plaque in the brachiocephalic and right subclavian arteries. Mild narrowing of the proximal left subclavian artery due to soft and calcified plaque. Right carotid system: Moderate soft and calcified plaque in the mid to distal the common carotid artery without significant stenosis.  Extensive, heavily calcified plaque at the bifurcation and carotid bulb results in approximately 50% proximal ICA stenosis. Left carotid system: Mild-to-moderate calcified and soft plaque in the mid to distal common carotid artery without significant stenosis. Eccentric, calcified plaque in the proximal ICA results in 30% stenosis. There is moderate proximal ECA stenosis due to soft plaque. Vertebral arteries: Patent with the left being slightly larger than the right. No evidence of dissection or significant stenosis. Slight irregularity of the V3 segments bilaterally for which mild fibromuscular dysplasia is not excluded. Skeleton: Multilevel cervical disc and facet degeneration. Interbody and posterior element ankylosis at C3-4, C4-5, and C6-7. Other neck: No mass or lymph node enlargement. Upper chest: Partially visualized mosaic attenuation in the left upper lobe, possibly air trapping. Review of the MIP images confirms the above findings CTA HEAD FINDINGS Anterior circulation: The internal carotid arteries are patent from skullbase to carotid termini with diffuse siphon atherosclerosis bilaterally. There is mild cavernous and supraclinoid segment stenosis on the left. The MCAs are patent without evidence of major branch occlusion or significant proximal stenosis. There is an early MCA bifurcation on the left. There is mild asymmetric attenuation of distal left MCA branch vessels. The ACAs are patent without evidence of major branch occlusion or significant proximal stenosis. The left A1 segment is hypoplastic. No aneurysm. Posterior circulation: The intracranial vertebral arteries are patent with mild non stenotic calcified plaque in the left V4 segment. Patent PICA, AICA, and SCA origins are visualized bilaterally. The basilar artery is patent without stenosis. There is a prominent right posterior communicating artery with markedly hypoplastic or  absent/ occluded right P1 segment. No significant PCA stenosis  is identified. No aneurysm. Venous sinuses: Not well evaluated due to contrast timing. Anatomic variants: Fetal origin of the right PCA. Hypoplastic left A1. Delayed phase: Not performed. Review of the MIP images confirms the above findings IMPRESSION: 1. No emergent large vessel occlusion. 2. Advanced aortic atherosclerosis. 3. 50% proximal right ICA stenosis. 4. 30% proximal left ICA stenosis. 5. Mild intracranial left ICA stenosis. Preliminary findings of no large vessel occlusion were called by telephone at the time of interpretation on 07/31/2016 at 10:45 am to Dr. Amada Jupiter, who verbally acknowledged these results. Electronically Signed   By: Sebastian Ache M.D.   On: 07/31/2016 11:09   Mr Brain Wo Contrast  Result Date: 07/31/2016 CLINICAL DATA:  Slurred speech and RIGHT-sided weakness, last seen normal last night. History of diabetes, hypertension. EXAM: MRI HEAD WITHOUT CONTRAST TECHNIQUE: Multiplanar, multiecho pulse sequences of the brain and surrounding structures were obtained without intravenous contrast. COMPARISON:  CTA HEAD and neck July 31, 2016 at 1034 hours. FINDINGS: BRAIN: Faint patchy reduced diffusion LEFT frontal lobe including the operculum. Corresponding low ADC values with faint susceptibility artifact LEFT frontal cortex. Moderate to severe ventriculomegaly on the basis of global parenchymal brain volume loss. Confluent supratentorial white matter FLAIR T2 hyperintensities. No midline shift, mass effect or masses. Old RIGHT caudate head lacunar infarct. No abnormal extra-axial fluid collections. VASCULAR: Major intracranial vascular flow voids present at skull base, dolichoectasia associated with chronic hypertension. SKULL AND UPPER CERVICAL SPINE: No abnormal sellar expansion. No suspicious calvarial bone marrow signal. Craniocervical junction maintained. SINUSES/ORBITS: The mastoid air-cells and included paranasal sinuses are well-aerated. The included ocular globes and orbital  contents are non-suspicious. Status post bilateral ocular lens implants. OTHER: None. IMPRESSION: Acute multifocal small LEFT frontal lobe infarcts (MCA territory) with faint petechial hemorrhage. Chronic changes include old RIGHT caudate head lacunar infarct and moderate chronic small vessel ischemic disease. Electronically Signed   By: Awilda Metro M.D.   On: 07/31/2016 19:57   Ct Head Code Stroke Wo Contrast`  Result Date: 07/31/2016 CLINICAL DATA:  Code stroke.  Aphasia.  Right arm and leg drift. EXAM: CT HEAD WITHOUT CONTRAST TECHNIQUE: Contiguous axial images were obtained from the base of the skull through the vertex without intravenous contrast. COMPARISON:  Brain MRI 02/07/2015 FINDINGS: Brain: There is no evidence of acute intracranial hemorrhage, mass, midline shift, or extra-axial fluid collection. There is age related cerebral atrophy. Patchy to confluent bilateral cerebral white matter hypodensities are nonspecific but compatible with moderate chronic small vessel ischemic disease. There is mildly asymmetric white matter hypoattenuation at the level of the posterior left corona radiata (series 201, image 21) with possible early loss of gray- white differentiation involving the overlying cortex. There is a chronic lacunar infarct in the right caudate nucleus. Vascular: Calcified atherosclerosis at the skullbase. No proximal dense MCA. Slightly increased density of a small MCA branch vessel posteriorly in the left sylvian fissure, which may represent a distal M2/ proximal M3 embolus versus atherosclerosis. Skull: No fracture or focal osseous lesion. Sinuses/Orbits: Bilateral cataract extraction. No significant sinus disease. Other: None. ASPECTS [facility] Stroke Program Early CT Score) - Ganglionic level infarction (caudate, lentiform nuclei, internal capsule, insula, M1-M3 cortex): 7 - Supraganglionic infarction (M4-M6 cortex): 2 Total score (0-10 with 10 being normal): 9 IMPRESSION: 1. No acute  intracranial hemorrhage. 2. Possible early left parietal lobe infarct. 3. ASPECTS is 9. 4. Moderate chronic small vessel ischemic disease. These results were called by  telephone at the time of interpretation on 07/31/2016 at 10:30 am to Dr. Amada JupiterKirkpatrick, who verbally acknowledged these results. Electronically Signed   By: Sebastian AcheAllen  Grady M.D.   On: 07/31/2016 10:39      Lab Results  Component Value Date   HGBA1C 6.4 (H) 06/23/2015   HGBA1C 7.3 (H) 02/07/2015   HGBA1C 6.9 (H) 06/25/2012   Lab Results  Component Value Date   LDLCALC 90 08/01/2016   CREATININE 1.30 (H) 08/01/2016       Scheduled Meds: .  stroke: mapping our early stages of recovery book   Does not apply Once  . aspirin  300 mg Rectal Daily  . cefTRIAXone (ROCEPHIN)  IV  1 g Intravenous Q24H  . heparin  5,000 Units Subcutaneous Q8H  . insulin aspart  0-9 Units Subcutaneous TID WC   Continuous Infusions: . sodium chloride 75 mL/hr at 07/31/16 1653     LOS: 1 day    Time spent: >30 MINS    [facility] Center LLCBROL,Krisi Azua  [facility] Pager [phone]. If 7PM-7AM, please contact night-coverage at [email or web address], password [email or web address] 08/01/2016, 10:09 AM  LOS: 1 day

## 2016-08-01 NOTE — Evaluation (Signed)
Physical Therapy Evaluation Patient Details Name: Clayton SkyeWilliam G [name] MRN: [id number] DOB: Apr 14, 1920 Today's Date: 08/01/2016   History of Present Illness  83 year old man PMH paroxysmal atrial fibrillation, chronic slurred speech, swallowing difficulty, chronic kidney disease stage III, ICD, diabetes mellitus, admitted due to new onset aphasia.  Head CT early left parietal CVA.  Clinical Impression  Patient presents with decreased independence and safety with mobility due to decreased balance and generalized weakness.  Currently mod A level (but feel could be somewhat safer with his rollator), and was able to ambulate independent with rollator previously.  Feel he may benefit from CIR level rehab as family has refused STSNF and currently unable to provide 24 hour assist.  PT to follow acutely.     Follow Up Recommendations CIR    Equipment Recommendations  None recommended by PT    Recommendations for Other Services Rehab consult     Precautions / Restrictions Precautions Precautions: Fall      Mobility  Bed Mobility Overal bed mobility: Needs Assistance Bed Mobility: Supine to Sit;Sit to Supine     Supine to sit: Min assist;HOB elevated Sit to supine: Mod assist   General bed mobility comments: assist to lift trunk, to supine assist for legs into bed  Transfers Overall transfer level: Needs assistance Equipment used: Rolling walker (2 wheeled) Transfers: Sit to/from UGI CorporationStand;Stand Pivot Transfers Sit to Stand: Mod assist Stand pivot transfers: Mod assist       General transfer comment: lifting assist up from EOB initially, assist to lower into chair to prevent uncontrolled descent, up and back to bed for testing with assist for balance/safety without walker  Ambulation/Gait Ambulation/Gait assistance: Mod assist Ambulation Distance (Feet): 20 Feet Assistive device: Rolling walker (2 wheeled) Gait Pattern/deviations: Step-through pattern;Decreased stride length;Trunk  flexed     General Gait Details: assist needed for walker safety, pt used to rollator and using front wheeled walker in room with difficulty, daughter reports flexed posture at baseline  Stairs            Wheelchair Mobility    Modified Rankin (Stroke Patients Only)       Balance Overall balance assessment: Needs assistance   Sitting balance-Leahy Scale: Fair     Standing balance support: Bilateral upper extremity supported Standing balance-Leahy Scale: Poor Standing balance comment: UE support and assist for balance, initially posterior bias                             Pertinent Vitals/Pain Pain Assessment: Faces Faces Pain Scale: No hurt    Home Living Family/patient expects to be discharged to:: Private residence [facility]([place] [facility]) Living Arrangements: Spouse/significant other Available Help at Discharge: Family Type of Home: Apartment Home Access: Elevator     Home Layout: One level Home Equipment: Environmental consultantWalker - 4 wheels      Prior Function Level of Independence: Needs assistance      ADL's / Homemaking Assistance Needed: has visual deficits  Comments: uses rollator at baseline and daughter reports has been fall risk due to vision deficits     Hand Dominance   Dominant Hand: Right    Extremity/Trunk Assessment   Upper Extremity Assessment Upper Extremity Assessment: RUE deficits/detail RUE Deficits / Details: mild weakness R UE compared to L, daughter reports history of shoulder fx    Lower Extremity Assessment Lower Extremity Assessment: Generalized weakness    Cervical / Trunk Assessment Cervical / Trunk Assessment: Kyphotic  Communication  Communication: HOH;Expressive difficulties (aphasic)  Cognition Arousal/Alertness: Awake/alert Behavior During Therapy: Flat affect Overall Cognitive Status: Difficult to assess                                        General Comments      Exercises      Assessment/Plan    PT Assessment Patient needs continued PT services  PT Problem List Decreased strength       PT Treatment Interventions Gait training;DME instruction;Functional mobility training;Balance training;Therapeutic exercise;Patient/family education;Therapeutic activities    PT Goals (Current goals can be found in the Care Plan section)  Acute Rehab PT Goals Patient Stated Goal: To return to ILF PT Goal Formulation: With patient/family Time For Goal Achievement: 08/15/16 Potential to Achieve Goals: Fair    Frequency Min 4X/week   Barriers to discharge Decreased caregiver support wife uses walker, daughter reports could stay at night    Co-evaluation               End of Session Equipment Utilized During Treatment: Gait belt Activity Tolerance: Patient tolerated treatment well Patient left: in bed;with call bell/phone within reach;with family/visitor present   PT Visit Diagnosis: Other abnormalities of gait and mobility (R26.89);Muscle weakness (generalized) (M62.81)    Time: 1010-1050 PT Time Calculation (min) (ACUTE ONLY): 40 min   Charges:   PT Evaluation $PT Eval Moderate Complexity: 1 Procedure PT Treatments $Gait Training: 8-22 mins $Therapeutic Activity: 8-22 mins   PT G CodesSheran Lawless, Jay [phone] 08/01/2016   Elray Mcgregor 08/01/2016, 11:11 AM

## 2016-08-01 NOTE — Progress Notes (Signed)
STROKE TEAM PROGRESS NOTE   HISTORY OF PRESENT ILLNESS (per record) Clayton [name] is a 83 y.o. male who was last in his normal state yesterday evening 07/31/2014 prior to bed. When he woke up, his wife helped him get to the bathroom, did not notice any weakness but he did not speak to her. He then came into the den and sat down, but was indicating that something was wrong. She noticed then that his speech was not normal and EMS was called. Patient was not administered IV t-PA secondary to being outside of window. He was admitted for further evaluation and treatment.   SUBJECTIVE (INTERVAL HISTORY) Pt daughter and wife are at bedside. Pt still has severe dysarthria and expressive aphasia. Able to move all extremities. CTA showed severe athero at aortic arch, large vessels. Denies heart palpitation. Recommend 30 day monitoring.   OBJECTIVE Temp:  [97.5 F (36.4 C)-97.9 F (36.6 C)] 97.8 F (36.6 C) (03/29 0932) Pulse Rate:  [55-84] 70 (03/29 0932) Cardiac Rhythm: Heart block;Bundle branch block (03/28 2244) Resp:  [12-22] 20 (03/29 0932) BP: (125-168)/(69-91) 168/73 (03/29 0932) SpO2:  [93 %-98 %] 94 % (03/29 0932)  CBC:  Recent Labs Lab 07/31/16 0952 07/31/16 1010 08/01/16 0526  WBC 9.2  --  9.6  NEUTROABS 6.6  --   --   HGB 13.3 13.6 12.5*  HCT 40.5 40.0 38.2*  MCV 96.2  --  96.5  PLT 211  --  219    Basic Metabolic Panel:  Recent Labs Lab 07/31/16 0952 07/31/16 1010 08/01/16 0526  NA 138 139 140  K 4.5 4.5 4.1  CL 102 103 106  CO2 25  --  24  GLUCOSE 155* 154* 122*  BUN 28* 32* 24*  CREATININE 1.41* 1.40* 1.30*  CALCIUM 9.6  --  9.0    Lipid Panel:    Component Value Date/Time   CHOL 158 08/01/2016 0526   TRIG 134 08/01/2016 0526   HDL 41 08/01/2016 0526   CHOLHDL 3.9 08/01/2016 0526   VLDL 27 08/01/2016 0526   LDLCALC 90 08/01/2016 0526   HgbA1c:  Lab Results  Component Value Date   HGBA1C 6.4 (H) 06/23/2015   Urine Drug Screen: No results found  for: LABOPIA, COCAINSCRNUR, LABBENZ, AMPHETMU, THCU, LABBARB    IMAGING I have personally reviewed the radiological images below and agree with the radiology interpretations.  Ct Head Code Stroke Wo Contrast` 07/31/2016 1. No acute intracranial hemorrhage. 2. Possible early left parietal lobe infarct. 3. ASPECTS is 9. 4. Moderate chronic small vessel ischemic disease.   Ct Angio Head W Or Wo Contrast Ct Angio Neck W Or Wo Contrast 07/31/2016 1. No emergent large vessel occlusion. 2. Advanced aortic atherosclerosis. 3. 50% proximal right ICA stenosis. 4. 30% proximal left ICA stenosis. 5. Mild intracranial left ICA stenosis.   Mr Brain Wo Contrast 07/31/2016 Acute multifocal small LEFT frontal lobe infarcts (MCA territory) with faint petechial hemorrhage. Chronic changes include old RIGHT caudate head lacunar infarct and moderate chronic small vessel ischemic disease.   TTE  Left ventricle: The cavity size was normal. Wall thickness was   increased in a pattern of moderate LVH. There was focal basal   hypertrophy. Systolic function was normal. The estimated ejection   fraction was in the range of 55% to 60%. Wall motion was normal;   there were no regional wall motion abnormalities. Doppler   parameters are consistent with abnormal left ventricular   relaxation (grade 1 diastolic dysfunction). -  Aortic valve: There was very mild stenosis. Valve area (VTI):   1.69 cm^2. Valve area (Vmax): 1.61 cm^2. Valve area (Vmean): 1.63   cm^2. - Mitral valve: Mildly calcified annulus. Mildly thickened, mildly   calcified leaflets . There was moderate regurgitation. - Right atrium: The atrium was mildly dilated.   PHYSICAL EXAM  Temp:  [97.5 F (36.4 C)-97.9 F (36.6 C)] 97.9 F (36.6 C) (03/29 1701) Pulse Rate:  [55-84] 71 (03/29 1701) Resp:  [16-20] 16 (03/29 1701) BP: (125-171)/(73-90) 171/90 (03/29 1701) SpO2:  [93 %-97 %] 96 % (03/29 1701)  General - Well nourished, well developed,  in no apparent distress.  Ophthalmologic - Fundi not visualized due to noncooperation.  Cardiovascular - Regular rate and rhythm.  Mental Status -  Level of arousal and orientation to month, place and people intact, however, not orientated to year and age. Language exam showed following all simple commands, but severe dysarthria and partial expressive aphasia, able to have single words out but not whole sentences, able to repeat 3 words sentence but not any longer sentences, naming 1/3.   Cranial Nerves II - XII - II - blinking to visual threat bilaterally. III, IV, VI - Extraocular movements intact, attending to both sides. V - Facial sensation intact bilaterally. VII - right mild nasolabial fold flattening. VIII - Hearing & vestibular intact bilaterally. X - Palate elevates symmetrically. XI - Chin turning & shoulder shrug intact bilaterally. XII - Tongue protrusion intact.  Motor Strength - The patient's strength was normal in all extremities and pronator drift was absent.  Bulk was normal and fasciculations were absent.   Motor Tone - Muscle tone was assessed at the neck and appendages and was normal.  Reflexes - The patient's reflexes were 1+ in all extremities and he had no pathological reflexes.  Sensory - Light touch, temperature/pinprick were assessed and were symmetrical.    Coordination - The patient had normal movements in the hands with no ataxia or dysmetria.  Tremor was absent.  Gait and Station - deferred.   ASSESSMENT/PLAN Mr. Clayton [name] is a 83 y.o. male with history of AF not on AC, HTN, HLD and DM presenting with aphasia. He did not receive IV t-PA due to delay in arrival.   Stroke:  Small L MCA territory infarcts embolic likely secondary to large vessel atherosclerosis. However, pt did have remote transient afib in the setting of acute pancreatitis, therefore recommend cardiac monitoring to rule out afib.  Resultant  Severe dysarthria and partial  expressive aphasia  Code stroke CT no acute hmg. Possible early L parietal infarct. small vessel disease.  Aspects 9.   CTA head & neck no LVO. Severe aortic atheroma. R ICA 50%, L ICA 30%. Mild IC L ICA stenosis.  MRI  Acute small L frontal lobe (MCA) infarcts with petechial hemorrhage. Old R caudate lacune.   Recommend 30 day cardiac event monitoring as outpt to rule out afib  2D Echo  EF 55-60%  LDL 90  HgbA1c pending  Heparin 5000 units sq tid for VTE prophylaxis  Diet NPO time specified  No antithrombotic prior to admission, now on aspirin 300 mg suppository daily. If passed swallow or have po access, recommend ASA 325mg  and plavix 75mg  for 3 months and then plavix alone.   Patient counseled to be compliant with his antithrombotic medications  Ongoing aggressive stroke risk factor management  Therapy recommendations:  pending   Disposition:  pending  (home w/ wife)  Severe aortic athroma  CTA neck confirmed severe aortic atheroma  Likely the cause of embolic stroke  Stroke risk factor modification  Recommend DAPT for 3 months and then plavix alone  Continue statin  Transient atrial fibrillation in the setting of acute pancreatitis 25 years ago  Not sure if pt still has paroxysmal afib  Pt had followed up with Dr. Allyson Sabal in the past for PVD but not for heart problem  Pt and family denies any arrhythmia since then  Recommend 30 day cardiac event monitoring as outpt to rule out afib    Hypertension  Stable  Hyperlipidemia  Home meds:  No statin  LDL 90, goal < 70  On lipitor 20mg   Continue statin at discharge  Diabetes  HgbA1c pending, goal < 7.0  Other Stroke Risk Factors  Advanced age  PVD - followed with Dr. Allyson Sabal   Former Cigarette smoker  Other Active Problems    Hospital day # 1  Marvel Plan, MD PhD Stroke Neurology 08/01/2016 5:43 PM    To contact Stroke Continuity provider, please refer to [email or web address]. After hours,  contact General Neurology

## 2016-08-01 NOTE — Evaluation (Signed)
Occupational Therapy Evaluation Patient Details Name: Clayton [name] MRN: [id number] DOB: [birth year] Today's Date: 08/01/2016    History of Present Illness 83 year old man PMH paroxysmal atrial fibrillation, chronic slurred speech, swallowing difficulty, chronic kidney disease stage III, ICD, diabetes mellitus, admitted due to new onset aphasia.  Head CT early left parietal CVA.   Clinical Impression   Pt with decline in function and safety with ADLs and ADL mobility with decreased strength, balance and endurance.Pt denies any changes in vision. Pt fatigues easily and demos slow processing with instructions. Pt would benefit form acute OT services to address impairments to increase level of function and safety    Follow Up Recommendations  CIR    Equipment Recommendations  None recommended by OT;Other (comment);3 in 1 bedside commode (TBD at next venue of care)    Recommendations for Other Services Rehab consult     Precautions / Restrictions Precautions Precautions: Fall Restrictions Weight Bearing Restrictions: No      Mobility Bed Mobility Overal bed mobility: Needs Assistance Bed Mobility: Supine to Sit;Sit to Supine     Supine to sit: Min assist;HOB elevated Sit to supine: Mod assist   General bed mobility comments: assist to elevate trunk and bring LEs back onto bed  Transfers Overall transfer level: Needs assistance Equipment used: Rolling walker (2 wheeled) Transfers: Sit to/from [employer] CorporationStand;Stand Pivot Transfers Sit to Stand: Mod assist Stand pivot transfers: Mod assist       General transfer comment: lifting assist up from EOB initially, assist to lower into chair to prevent uncontrolled descent, up and back to bed for testing with assist for balance/safety without walker    Balance Overall balance assessment: Needs assistance   Sitting balance-Leahy Scale: Fair     Standing balance support: Bilateral upper extremity supported Standing balance-Leahy  Scale: Poor Standing balance comment: UE support and assist for balance, initially posterior bias                           ADL either performed or assessed with clinical judgement   ADL Overall ADL's : Needs assistance/impaired     Grooming: Wash/dry hands;Wash/dry face;Sitting;Minimal assistance   Upper Body Bathing: Maximal assistance   Lower Body Bathing: Maximal assistance   Upper Body Dressing : Maximal assistance   Lower Body Dressing: Total assistance   Toilet Transfer: Moderate assistance;Stand-pivot   Toileting- Clothing Manipulation and Hygiene: Total assistance       Functional mobility during ADLs: Moderate assistance       Vision Baseline Vision/History: Macular Degeneration Patient Visual Report: No change from baseline (pt denies any change in vision, unable to properly assess vision due to pt fatigue) Additional Comments: pt denies any change in vision, unable to properly assess vision due to pt fatigue and slow to process instructions     Perception Perception Perception Tested?: No   Praxis      Pertinent Vitals/Pain Pain Assessment: No/denies pain Faces Pain Scale: No hurt Pain Intervention(s): Monitored during session     Hand Dominance Right   Extremity/Trunk Assessment Upper Extremity Assessment Upper Extremity Assessment: RUE deficits/detail RUE Deficits / Details: mild weakness R UE compared to L, daughter reports history of shoulder fx   Lower Extremity Assessment Lower Extremity Assessment: Generalized weakness   Cervical / Trunk Assessment Cervical / Trunk Assessment: Kyphotic   Communication Communication Communication: HOH;Expressive difficulties   Cognition Arousal/Alertness: Awake/alert Behavior During Therapy: Flat affect Overall Cognitive Status: Impaired/Different from baseline  General Comments   pt pleasant and cooperative               Home  Living Family/patient expects to be discharged to:: Private residence Living Arrangements: Spouse/significant other Available Help at Discharge: Family Type of Home: Apartment Home Access: Elevator     Home Layout: One level     Bathroom Shower/Tub: Walk-in shower         Home Equipment: Environmental consultant - 4 wheels;Shower seat          Prior Functioning/Environment Level of Independence: Needs assistance    ADL's / Homemaking Assistance Needed: has visual deficits (peripheral)   Comments: uses rollator at baseline and daughter reports has been fall risk due to vision deficits        OT Problem List: Decreased strength;Decreased cognition;Impaired balance (sitting and/or standing);Decreased knowledge of use of DME or AE;Decreased coordination;Decreased activity tolerance;Decreased safety awareness      OT Treatment/Interventions: Self-care/ADL training;DME and/or AE instruction;Therapeutic activities;Patient/family education;Therapeutic exercise    OT Goals(Current goals can be found in the care plan section) Acute Rehab OT Goals Patient Stated Goal: To return to ILF OT Goal Formulation: With patient/family Time For Goal Achievement: 08/08/16 Potential to Achieve Goals: Good ADL Goals Pt Will Perform Grooming: with min guard assist;sitting Pt Will Perform Upper Body Bathing: with mod assist;sitting Pt Will Perform Lower Body Bathing: with mod assist;sitting/lateral leans Pt Will Perform Upper Body Dressing: with mod assist;sitting Pt Will Transfer to Toilet: with min assist;bedside commode;ambulating;regular height toilet;grab bars Additional ADL Goal #1: B UE strengthening exercises seated EOB to increase strength and acitivity tolerance for functional tasks  OT Frequency: Min 2X/week   Barriers to D/C: Decreased caregiver support                        End of Session Equipment Utilized During Treatment: Gait belt  Activity Tolerance: Patient limited by  fatigue Patient left: in bed;with call bell/phone within reach;with family/visitor present  OT Visit Diagnosis: Unsteadiness on feet (R26.81);Muscle weakness (generalized) (M62.81);Other symptoms and signs involving the nervous system (R29.898)                Time: 1355-1420 OT Time Calculation (min): 25 min Charges:  OT Evaluation $OT Eval Moderate Complexity: 1 Procedure OT Treatments $Therapeutic Activity: 8-22 mins G-Codes: OT G-codes **NOT FOR INPATIENT CLASS** Functional Assessment Tool Used: AM-PAC 6 Clicks Daily Activity     Galen Manila 08/01/2016, 2:34 PM

## 2016-08-01 NOTE — Consult Note (Addendum)
Physical Medicine and Rehabilitation Consult  Reason for Consult: Weakness, difficulty speaking and severe dysphagia.  Referring Physician: Dr. Susie Cassette.    HPI: Clayton [name] is a 83 y.o. male with history of T2DM, CKD,  PAF, BPH, syncopal episodes, visual deficits; who was admitted on 07/31/16 with difficulty speaking. MRI brain done revealing acute multifocal left frontal lobe infarcts with faint petechial hemorrhage. CTA head/neck done revealing advanced aortic atherosclerosis, 50% proximal R-ICA and 30% proximal L-ICA stenosis. 2 D echo done today. Dr. Amada Jupiter recommends ASA for secondary stroke prevention.  MBS with severe dysphagia due to significant oral and pharyngeal sensorimotor weakness and NPO recommended. PT evaluation revealed flexed posture with decreased balance and requires assistance with mobility. CIR recommended for follow up therapy.  Patient ambulated with walker at home. He lives in an assisted living facility with his wife.  Review of Systems  Unable to perform ROS: Patient nonverbal      Past Medical History:  Diagnosis Date  . Anemia   . Cataracts, bilateral   . CKD (chronic kidney disease), stage III   . Diabetes mellitus without complication (HCC)   . Distended bladder   . Diverticulosis    on scan  . Hard of hearing   . Hiatal hernia   . Hypertension   . Kidney stones   . Macular degeneration   . paroxysmal afib    right bundle branch block  . Syncopal episodes     Past Surgical History:  Procedure Laterality Date  . EYE SURGERY     cataracts with lens implants  . HERNIA REPAIR    . KIDNEY STONE SURGERY     since young age  . ORIF HUMERUS FRACTURE Right 06/26/2012   Procedure: OPEN REDUCTION INTERNAL FIXATION (ORIF) PROXIMAL HUMERUS FRACTURE, right ;  Surgeon: Budd Palmer, MD;  Location: [facility] OR;  Service: Orthopedics;  Laterality: Right;  . TRANSURETHRAL RESECTION OF PROSTATE N/A 06/30/2015   Procedure: TRANSURETHRAL RESECTION OF  THE PROSTATE (TURP);  Surgeon: Crist Fat, MD;  Location: [facility] ORS;  Service: Urology;  Laterality: N/A;    Family History  Problem Relation Age of Onset  . Aneurysm Father     Aortic aneurysm  . Macular degeneration Mother   . Gallbladder disease Mother   . Pancreatitis Sister   . Macular degeneration Sister     x 4    Social History:  Married. Independent with rollater PTA. Per reports that he quit smoking about 47 years ago. His smoking use included Cigars. He has never used smokeless tobacco. Per reports that he does not drink alcohol or use drugs.    Allergies  Allergen Reactions  . Sulfa Antibiotics Itching    Medications Prior to Admission  Medication Sig Dispense Refill  . alendronate (FOSAMAX) 70 MG tablet Take 70 mg by mouth once a week. Take with a full glass of water on an empty stomach. ( Sunday )    . amLODipine (NORVASC) 2.5 MG tablet Take 2.5 mg by mouth at bedtime.    . metFORMIN (GLUCOPHAGE-XR) 500 MG 24 hr tablet Take 500 mg by mouth daily with breakfast.    . traMADol (ULTRAM) 50 MG tablet Take 1 tablet (50 mg total) by mouth 3 (three) times daily as needed (for pain). 30 tablet 0    Home: Home Living Family/patient expects to be discharged to:: Private residence [facility]) Living Arrangements: Spouse/significant other Available Help at Discharge: Family Type of Home: Apartment  Home Access: Elevator Home Layout: One level Home Equipment: Walker - 4 wheels  Functional History: Prior Function Level of Independence: Needs assistance ADL's / Homemaking Assistance Needed: has visual deficits Comments: uses rollator at baseline and daughter reports has been fall risk due to vision deficits Functional Status:  Mobility: Bed Mobility Overal bed mobility: Needs Assistance Bed Mobility: Supine to Sit, Sit to Supine Supine to sit: Min assist, HOB elevated Sit to supine: Mod assist General bed mobility comments: assist to lift trunk, to  supine assist for legs into bed Transfers Overall transfer level: Needs assistance Equipment used: Rolling walker (2 wheeled) Transfers: Sit to/from Stand, Stand Pivot Transfers Sit to Stand: Mod assist Stand pivot transfers: Mod assist General transfer comment: lifting assist up from EOB initially, assist to lower into chair to prevent uncontrolled descent, up and back to bed for testing with assist for balance/safety without walker Ambulation/Gait Ambulation/Gait assistance: Mod assist Ambulation Distance (Feet): 20 Feet Assistive device: Rolling walker (2 wheeled) Gait Pattern/deviations: Step-through pattern, Decreased stride length, Trunk flexed General Gait Details: assist needed for walker safety, pt used to rollator and using front wheeled walker in room with difficulty, daughter reports flexed posture at baseline    ADL:    Cognition: Cognition Overall Cognitive Status: Difficult to assess Orientation Level: Oriented to person, Oriented to place, Disoriented to time, Disoriented to situation Cognition Arousal/Alertness: Awake/alert Behavior During Therapy: Flat affect Overall Cognitive Status: Difficult to assess Difficult to assess due to: Impaired communication   Blood pressure (!) 168/73, pulse 70, temperature 97.8 F (36.6 C), temperature source Oral, resp. rate 20, height 5\' 11"  (1.803 m), weight 63.9 kg (140 lb 14 oz), SpO2 94 %. Physical Exam  Nursing note and vitals reviewed. Constitutional: He appears well-developed.  Frail appearing elderly male lying in bed. Mouth breathing.   HENT:  Head: Normocephalic and atraumatic.  Mouth/Throat: Oropharynx is clear and moist.  Eyes: Conjunctivae and EOM are normal.  Neck: Normal range of motion. Neck supple.  Cardiovascular: Normal rate and regular rhythm.   Respiratory: Effort normal and breath sounds normal. No stridor. No respiratory distress. He has no wheezes.  GI: Soft. Bowel sounds are normal. He exhibits no  distension.  Musculoskeletal: He exhibits no edema or tenderness.  Neurological: He is alert.  Expressive aphasia noted. Able to follow simple motor commands and point to items without difficulty. Moves all four.   Skin: Skin is warm and dry. He is not diaphoretic.  , Severe dysarthria. Hard of hearing. Motor strength is 4/5 in the right deltoid, biceps, triceps, grip, hip flexor, knee extensor, ankle dorsal flexor 5/5 in the left deltoid, bicep, triceps, grip, hip flexor, knee extensor, ankle dorsiflexor. Sensation reports is equal to light touch bilateral upper and lower limb. No apparent visual field deficits  Results for orders placed or performed during the hospital encounter of 07/31/16 (from the past 24 hour(s))  Urinalysis, Routine w reflex microscopic     Status: Abnormal   Collection Time: 07/31/16  1:45 PM  Result Value Ref Range   Color, Urine STRAW (A) YELLOW   APPearance CLEAR CLEAR   Specific Gravity, Urine 1.012 1.005 - 1.030   pH 7.0 5.0 - 8.0   Glucose, UA NEGATIVE NEGATIVE mg/dL   Hgb urine dipstick SMALL (A) NEGATIVE   Bilirubin Urine NEGATIVE NEGATIVE   Ketones, ur NEGATIVE NEGATIVE mg/dL   Protein, ur NEGATIVE NEGATIVE mg/dL   Nitrite NEGATIVE NEGATIVE   Leukocytes, UA MODERATE (A) NEGATIVE   RBC /  HPF 0-5 0 - 5 RBC/hpf   WBC, UA TOO NUMEROUS TO COUNT 0 - 5 WBC/hpf   Bacteria, UA NONE SEEN NONE SEEN   Squamous Epithelial / LPF NONE SEEN NONE SEEN  Glucose, capillary     Status: Abnormal   Collection Time: 07/31/16  9:38 PM  Result Value Ref Range   Glucose-Capillary 120 (H) 65 - 99 mg/dL   Comment 1 Notify RN    Comment 2 Document in Chart   Glucose, capillary     Status: Abnormal   Collection Time: 08/01/16 12:06 AM  Result Value Ref Range   Glucose-Capillary 125 (H) 65 - 99 mg/dL   Comment 1 Notify RN    Comment 2 Document in Chart   Glucose, capillary     Status: Abnormal   Collection Time: 08/01/16  4:09 AM  Result Value Ref Range    Glucose-Capillary 138 (H) 65 - 99 mg/dL   Comment 1 Notify RN    Comment 2 Document in Chart   CBC     Status: Abnormal   Collection Time: 08/01/16  5:26 AM  Result Value Ref Range   WBC 9.6 4.0 - 10.5 K/uL   RBC 3.96 (L) 4.22 - 5.81 MIL/uL   Hemoglobin 12.5 (L) 13.0 - 17.0 g/dL   HCT 19.1 (L) 47.8 - 29.5 %   MCV 96.5 78.0 - 100.0 fL   MCH 31.6 26.0 - 34.0 pg   MCHC 32.7 30.0 - 36.0 g/dL   RDW 62.1 30.8 - 65.7 %   Platelets 219 150 - 400 K/uL  Basic metabolic panel     Status: Abnormal   Collection Time: 08/01/16  5:26 AM  Result Value Ref Range   Sodium 140 135 - 145 mmol/L   Potassium 4.1 3.5 - 5.1 mmol/L   Chloride 106 101 - 111 mmol/L   CO2 24 22 - 32 mmol/L   Glucose, Bld 122 (H) 65 - 99 mg/dL   BUN 24 (H) 6 - 20 mg/dL   Creatinine, Ser 8.46 (H) 0.61 - 1.24 mg/dL   Calcium 9.0 8.9 - 96.2 mg/dL   GFR calc non Af Amer 45 (L) >60 mL/min   GFR calc Af Amer 52 (L) >60 mL/min   Anion gap 10 5 - 15  Lipid panel     Status: None   Collection Time: 08/01/16  5:26 AM  Result Value Ref Range   Cholesterol 158 0 - 200 mg/dL   Triglycerides 952 <841 mg/dL   HDL 41 >32 mg/dL   Total CHOL/HDL Ratio 3.9 RATIO   VLDL 27 0 - 40 mg/dL   LDL Cholesterol 90 0 - 99 mg/dL  Glucose, capillary     Status: Abnormal   Collection Time: 08/01/16  7:30 AM  Result Value Ref Range   Glucose-Capillary 124 (H) 65 - 99 mg/dL   Comment 1 Notify RN    Comment 2 Document in Chart   Glucose, capillary     Status: Abnormal   Collection Time: 08/01/16 11:09 AM  Result Value Ref Range   Glucose-Capillary 128 (H) 65 - 99 mg/dL   Comment 1 Notify RN    Comment 2 Document in Chart    Ct Angio Head W Or Wo Contrast  Result Date: 07/31/2016 CLINICAL DATA:  Aphasia.  Right arm and leg drift. EXAM: CT ANGIOGRAPHY HEAD AND NECK TECHNIQUE: Multidetector CT imaging of the head and neck was performed using the standard protocol during bolus administration of intravenous contrast. Multiplanar CT  image  reconstructions and MIPs were obtained to evaluate the vascular anatomy. Carotid stenosis measurements (when applicable) are obtained utilizing NASCET criteria, using the distal internal carotid diameter as the denominator. CONTRAST:  50 mL Isovue 370 COMPARISON:  Head CT 07/31/2016 and MRI 02/07/2015. No prior angiographic imaging. FINDINGS: CTA NECK FINDINGS Aortic arch: Standard 3 vessel aortic arch with heavy calcified and irregular soft plaque projecting into the lumen. Non-stenotic plaque in the brachiocephalic and right subclavian arteries. Mild narrowing of the proximal left subclavian artery due to soft and calcified plaque. Right carotid system: Moderate soft and calcified plaque in the mid to distal the common carotid artery without significant stenosis. Extensive, heavily calcified plaque at the bifurcation and carotid bulb results in approximately 50% proximal ICA stenosis. Left carotid system: Mild-to-moderate calcified and soft plaque in the mid to distal common carotid artery without significant stenosis. Eccentric, calcified plaque in the proximal ICA results in 30% stenosis. There is moderate proximal ECA stenosis due to soft plaque. Vertebral arteries: Patent with the left being slightly larger than the right. No evidence of dissection or significant stenosis. Slight irregularity of the V3 segments bilaterally for which mild fibromuscular dysplasia is not excluded. Skeleton: Multilevel cervical disc and facet degeneration. Interbody and posterior element ankylosis at C3-4, C4-5, and C6-7. Other neck: No mass or lymph node enlargement. Upper chest: Partially visualized mosaic attenuation in the left upper lobe, possibly air trapping. Review of the MIP images confirms the above findings CTA HEAD FINDINGS Anterior circulation: The internal carotid arteries are patent from skullbase to carotid termini with diffuse siphon atherosclerosis bilaterally. There is mild cavernous and supraclinoid segment  stenosis on the left. The MCAs are patent without evidence of major branch occlusion or significant proximal stenosis. There is an early MCA bifurcation on the left. There is mild asymmetric attenuation of distal left MCA branch vessels. The ACAs are patent without evidence of major branch occlusion or significant proximal stenosis. The left A1 segment is hypoplastic. No aneurysm. Posterior circulation: The intracranial vertebral arteries are patent with mild non stenotic calcified plaque in the left V4 segment. Patent PICA, AICA, and SCA origins are visualized bilaterally. The basilar artery is patent without stenosis. There is a prominent right posterior communicating artery with markedly hypoplastic or absent/ occluded right P1 segment. No significant PCA stenosis is identified. No aneurysm. Venous sinuses: Not well evaluated due to contrast timing. Anatomic variants: Fetal origin of the right PCA. Hypoplastic left A1. Delayed phase: Not performed. Review of the MIP images confirms the above findings IMPRESSION: 1. No emergent large vessel occlusion. 2. Advanced aortic atherosclerosis. 3. 50% proximal right ICA stenosis. 4. 30% proximal left ICA stenosis. 5. Mild intracranial left ICA stenosis. Preliminary findings of no large vessel occlusion were called by telephone at the time of interpretation on 07/31/2016 at 10:45 am to Dr. Amada Jupiter, who verbally acknowledged these results. Electronically Signed   By: Sebastian Ache M.D.   On: 07/31/2016 11:09   Ct Angio Neck W Or Wo Contrast  Result Date: 07/31/2016 CLINICAL DATA:  Aphasia.  Right arm and leg drift. EXAM: CT ANGIOGRAPHY HEAD AND NECK TECHNIQUE: Multidetector CT imaging of the head and neck was performed using the standard protocol during bolus administration of intravenous contrast. Multiplanar CT image reconstructions and MIPs were obtained to evaluate the vascular anatomy. Carotid stenosis measurements (when applicable) are obtained utilizing NASCET  criteria, using the distal internal carotid diameter as the denominator. CONTRAST:  50 mL Isovue 370 COMPARISON:  Head CT  07/31/2016 and MRI 02/07/2015. No prior angiographic imaging. FINDINGS: CTA NECK FINDINGS Aortic arch: Standard 3 vessel aortic arch with heavy calcified and irregular soft plaque projecting into the lumen. Non-stenotic plaque in the brachiocephalic and right subclavian arteries. Mild narrowing of the proximal left subclavian artery due to soft and calcified plaque. Right carotid system: Moderate soft and calcified plaque in the mid to distal the common carotid artery without significant stenosis. Extensive, heavily calcified plaque at the bifurcation and carotid bulb results in approximately 50% proximal ICA stenosis. Left carotid system: Mild-to-moderate calcified and soft plaque in the mid to distal common carotid artery without significant stenosis. Eccentric, calcified plaque in the proximal ICA results in 30% stenosis. There is moderate proximal ECA stenosis due to soft plaque. Vertebral arteries: Patent with the left being slightly larger than the right. No evidence of dissection or significant stenosis. Slight irregularity of the V3 segments bilaterally for which mild fibromuscular dysplasia is not excluded. Skeleton: Multilevel cervical disc and facet degeneration. Interbody and posterior element ankylosis at C3-4, C4-5, and C6-7. Other neck: No mass or lymph node enlargement. Upper chest: Partially visualized mosaic attenuation in the left upper lobe, possibly air trapping. Review of the MIP images confirms the above findings CTA HEAD FINDINGS Anterior circulation: The internal carotid arteries are patent from skullbase to carotid termini with diffuse siphon atherosclerosis bilaterally. There is mild cavernous and supraclinoid segment stenosis on the left. The MCAs are patent without evidence of major branch occlusion or significant proximal stenosis. There is an early MCA bifurcation  on the left. There is mild asymmetric attenuation of distal left MCA branch vessels. The ACAs are patent without evidence of major branch occlusion or significant proximal stenosis. The left A1 segment is hypoplastic. No aneurysm. Posterior circulation: The intracranial vertebral arteries are patent with mild non stenotic calcified plaque in the left V4 segment. Patent PICA, AICA, and SCA origins are visualized bilaterally. The basilar artery is patent without stenosis. There is a prominent right posterior communicating artery with markedly hypoplastic or absent/ occluded right P1 segment. No significant PCA stenosis is identified. No aneurysm. Venous sinuses: Not well evaluated due to contrast timing. Anatomic variants: Fetal origin of the right PCA. Hypoplastic left A1. Delayed phase: Not performed. Review of the MIP images confirms the above findings IMPRESSION: 1. No emergent large vessel occlusion. 2. Advanced aortic atherosclerosis. 3. 50% proximal right ICA stenosis. 4. 30% proximal left ICA stenosis. 5. Mild intracranial left ICA stenosis. Preliminary findings of no large vessel occlusion were called by telephone at the time of interpretation on 07/31/2016 at 10:45 am to Dr. Amada JupiterKirkpatrick, who verbally acknowledged these results. Electronically Signed   By: Sebastian AcheAllen  Grady M.D.   On: 07/31/2016 11:09   Mr Brain Wo Contrast  Result Date: 07/31/2016 CLINICAL DATA:  Slurred speech and RIGHT-sided weakness, last seen normal last night. History of diabetes, hypertension. EXAM: MRI HEAD WITHOUT CONTRAST TECHNIQUE: Multiplanar, multiecho pulse sequences of the brain and surrounding structures were obtained without intravenous contrast. COMPARISON:  CTA HEAD and neck July 31, 2016 at 1034 hours. FINDINGS: BRAIN: Faint patchy reduced diffusion LEFT frontal lobe including the operculum. Corresponding low ADC values with faint susceptibility artifact LEFT frontal cortex. Moderate to severe ventriculomegaly on the basis  of global parenchymal brain volume loss. Confluent supratentorial white matter FLAIR T2 hyperintensities. No midline shift, mass effect or masses. Old RIGHT caudate head lacunar infarct. No abnormal extra-axial fluid collections. VASCULAR: Major intracranial vascular flow voids present at skull base, dolichoectasia associated  with chronic hypertension. SKULL AND UPPER CERVICAL SPINE: No abnormal sellar expansion. No suspicious calvarial bone marrow signal. Craniocervical junction maintained. SINUSES/ORBITS: The mastoid air-cells and included paranasal sinuses are well-aerated. The included ocular globes and orbital contents are non-suspicious. Status post bilateral ocular lens implants. OTHER: None. IMPRESSION: Acute multifocal small LEFT frontal lobe infarcts (MCA territory) with faint petechial hemorrhage. Chronic changes include old RIGHT caudate head lacunar infarct and moderate chronic small vessel ischemic disease. Electronically Signed   By: Awilda Metro M.D.   On: 07/31/2016 19:57   Ct Head Code Stroke Wo Contrast`  Result Date: 07/31/2016 CLINICAL DATA:  Code stroke.  Aphasia.  Right arm and leg drift. EXAM: CT HEAD WITHOUT CONTRAST TECHNIQUE: Contiguous axial images were obtained from the base of the skull through the vertex without intravenous contrast. COMPARISON:  Brain MRI 02/07/2015 FINDINGS: Brain: There is no evidence of acute intracranial hemorrhage, mass, midline shift, or extra-axial fluid collection. There is age related cerebral atrophy. Patchy to confluent bilateral cerebral white matter hypodensities are nonspecific but compatible with moderate chronic small vessel ischemic disease. There is mildly asymmetric white matter hypoattenuation at the level of the posterior left corona radiata (series 201, image 21) with possible early loss of gray- white differentiation involving the overlying cortex. There is a chronic lacunar infarct in the right caudate nucleus. Vascular: Calcified  atherosclerosis at the skullbase. No proximal dense MCA. Slightly increased density of a small MCA branch vessel posteriorly in the left sylvian fissure, which may represent a distal M2/ proximal M3 embolus versus atherosclerosis. Skull: No fracture or focal osseous lesion. Sinuses/Orbits: Bilateral cataract extraction. No significant sinus disease. Other: None. ASPECTS [facility] Stroke Program Early CT Score) - Ganglionic level infarction (caudate, lentiform nuclei, internal capsule, insula, M1-M3 cortex): 7 - Supraganglionic infarction (M4-M6 cortex): 2 Total score (0-10 with 10 being normal): 9 IMPRESSION: 1. No acute intracranial hemorrhage. 2. Possible early left parietal lobe infarct. 3. ASPECTS is 9. 4. Moderate chronic small vessel ischemic disease. These results were called by telephone at the time of interpretation on 07/31/2016 at 10:30 am to Dr. Amada Jupiter, who verbally acknowledged these results. Electronically Signed   By: Sebastian Ache M.D.   On: 07/31/2016 10:39    Assessment/Plan: Diagnosis: Left frontal embolic infarct with mild right hemiparesis, severe dyskinesia as well as severe dysarthria and gait disorder 1. Does the need for close, 24 hr/day medical supervision in concert with the patient's rehab needs make it unreasonable for this patient to be served in a less intensive setting? Yes 2. Co-Morbidities requiring supervision/potential complications: Paroxysmal atrial fibrillation, type 2 diabetes, chronic kidney disease 3. Due to bladder management, bowel management, safety, skin/wound care, disease management, medication administration, pain management and patient education, does the patient require 24 hr/day rehab nursing? Yes 4. Does the patient require coordinated care of a physician, rehab nurse, PT (1-2 hrs/day, 5 days/week), OT (1-2 hrs/day, 5 days/week) and SLP (.5-1 hrs/day, 5 days/week) to address physical and functional deficits in the context of the above medical  diagnosis(es)? Yes Addressing deficits in the following areas: balance, endurance, locomotion, strength, transferring, bowel/bladder control, bathing, dressing, feeding, grooming, toileting, cognition, speech, language, swallowing and psychosocial support 5. Can the patient actively participate in an intensive therapy program of at least 3 hrs of therapy per day at least 5 days per week? Yes 6. The potential for patient to make measurable gains while on inpatient rehab is good 7. Anticipated functional outcomes upon discharge from inpatient rehab are modified independent  with PT, modified independent and supervision with OT, supervision with SLP. 8. Estimated rehab length of stay to reach the above functional goals is: 10-14d 9. Does the patient have adequate social supports and living environment to accommodate these discharge functional goals? Yes and Potentially 10. Anticipated D/C setting: Home 11. Anticipated post D/C treatments: HH therapy 12. Overall Rehab/Functional Prognosis: good  RECOMMENDATIONS: This patient's condition is appropriate for continued rehabilitative care in the following setting: CIR Patient has agreed to participate in recommended program. Yes Note that insurance prior authorization may be required for reimbursement for recommended care.  Comment: Should be ready in 1-2 days for CIR   Jacquelynn Cree, Cordelia Poche 08/01/2016

## 2016-08-02 DIAGNOSIS — I6300 Cerebral infarction due to thrombosis of unspecified precerebral artery: Secondary | ICD-10-CM

## 2016-08-02 DIAGNOSIS — R4701 Aphasia: Secondary | ICD-10-CM

## 2016-08-02 DIAGNOSIS — I480 Paroxysmal atrial fibrillation: Secondary | ICD-10-CM

## 2016-08-02 DIAGNOSIS — I700 Atherosclerosis of aorta: Secondary | ICD-10-CM

## 2016-08-02 LAB — GLUCOSE, CAPILLARY
GLUCOSE-CAPILLARY: 125 mg/dL — AB (ref 65–99)
Glucose-Capillary: 109 mg/dL — ABNORMAL HIGH (ref 65–99)
Glucose-Capillary: 123 mg/dL — ABNORMAL HIGH (ref 65–99)
Glucose-Capillary: 125 mg/dL — ABNORMAL HIGH (ref 65–99)
Glucose-Capillary: 212 mg/dL — ABNORMAL HIGH (ref 65–99)

## 2016-08-02 LAB — HEMOGLOBIN A1C
Hgb A1c MFr Bld: 7.2 % — ABNORMAL HIGH (ref 4.8–5.6)
Mean Plasma Glucose: 160 mg/dL

## 2016-08-02 MED ORDER — ASPIRIN 325 MG PO TABS
325.0000 mg | ORAL_TABLET | Freq: Every day | ORAL | Status: DC
  Administered 2016-08-03 – 2016-08-05 (×3): 325 mg via ORAL
  Filled 2016-08-02 (×3): qty 1

## 2016-08-02 MED ORDER — RESOURCE THICKENUP CLEAR PO POWD
ORAL | Status: DC | PRN
  Filled 2016-08-02: qty 125

## 2016-08-02 MED ORDER — CLOPIDOGREL BISULFATE 75 MG PO TABS
75.0000 mg | ORAL_TABLET | Freq: Every day | ORAL | Status: DC
  Administered 2016-08-02 – 2016-08-05 (×4): 75 mg via ORAL
  Filled 2016-08-02 (×4): qty 1

## 2016-08-02 NOTE — [place] FL2 (Signed)
[place] MEDICAID FL2 LEVEL OF CARE SCREENING TOOL     IDENTIFICATION  Patient Name: OTIS PORTAL Birthdate: [birth year] Sex: male Admission Date (Current Location): 07/31/2016  [facility] and [place] Number:  Producer, television/film/video and Address:  [facility], [address], [place], [place] [address]      Provider Number: [id number]  Attending Physician Name and Address:  Richarda Overlie, MD  Relative Name and Phone Number:       Current Level of Care: Hospital Recommended Level of Care: Skilled Nursing Facility Prior Approval Number:    Date Approved/Denied:   PASRR Number: [id number]  Discharge Plan: SNF    Current Diagnoses: Patient Active Problem List   Diagnosis Date Noted  . Aphasia   . Aortic atherosclerosis (HCC)   . Stroke (HCC) 07/31/2016  . CKD (chronic kidney disease), stage III 07/31/2016  . Urinary retention 06/30/2015  . AKI (acute kidney injury) (HCC) 05/22/2015  . Acute renal failure superimposed on stage 3 chronic kidney disease (HCC) 02/07/2015  . Syncope 02/07/2015  . Normocytic anemia 02/07/2015  . Hyperkalemia 02/07/2015  . Diabetes mellitus without complication (HCC)   . paroxysmal afib   . Absolute anemia   . Faintness   . Absent pedal pulses 12/28/2014  . Essential hypertension, benign 06/27/2012  . Proximal humerus fracture, right 06/24/2012    Orientation RESPIRATION BLADDER Height & Weight     Self, Place  Normal Incontinent Weight: 140 lb 14 oz (63.9 kg) Height:   (180.3 cm)  BEHAVIORAL SYMPTOMS/MOOD NEUROLOGICAL BOWEL NUTRITION STATUS      Continent  (Please see d/c summary)  AMBULATORY STATUS COMMUNICATION OF NEEDS Skin   Extensive Assist Verbally Normal                       Personal Care Assistance Level of Assistance  Bathing, Feeding, Dressing Bathing Assistance: Maximum assistance Feeding assistance: Limited assistance Dressing Assistance: Maximum assistance     Functional  Limitations Info  Sight, Hearing, Speech Sight Info: Adequate Hearing Info: Adequate Speech Info: Adequate    SPECIAL CARE FACTORS FREQUENCY  PT (By licensed PT), OT (By licensed OT)     PT Frequency: 4x OT Frequency: 4x            Contractures Contractures Info: Not present    Additional Factors Info  Code Status, Allergies Code Status Info: DNR Allergies Info: Sulfa Antibiotics           Current Medications (08/02/2016):  This is the current hospital active medication list Current Facility-Administered Medications  Medication Dose Route Frequency Provider Last Rate Last Dose  .  stroke: mapping our early stages of recovery book   Does not apply Once Isaiah Blakes, PA-C      . 0.9 %  sodium chloride infusion   Intravenous Continuous Isaiah Blakes, PA-C 75 mL/hr at 08/02/16 1340    . acetaminophen (TYLENOL) tablet 650 mg  650 mg Oral Q6H PRN Isaiah Blakes, PA-C       Or  . acetaminophen (TYLENOL) suppository 650 mg  650 mg Rectal Q6H PRN Isaiah Blakes, PA-C      . [START ON 08/03/2016] aspirin tablet 325 mg  325 mg Oral Daily Richarda Overlie, MD      . atorvastatin (LIPITOR) tablet 20 mg  20 mg Oral q1800 Richarda Overlie, MD   20 mg at 08/01/16 1833  . cefTRIAXone (ROCEPHIN) 1 g in dextrose 5 %  50 mL IVPB  1 g Intravenous Q24H Standley Brooking, MD   1 g at 08/01/16 1833  . clopidogrel (PLAVIX) tablet 75 mg  75 mg Oral Daily Richarda Overlie, MD   75 mg at 08/02/16 1249  . heparin injection 5,000 Units  5,000 Units Subcutaneous [address] PA-C   5,000 Units at 08/02/16 1455  . insulin aspart (novoLOG) injection 0-9 Units  0-9 Units Subcutaneous TID WC Isaiah Blakes, PA-C   1 Units at 08/02/16 1247  . ondansetron (ZOFRAN) tablet 4 mg  4 mg Oral Q6H PRN Isaiah Blakes, PA-C       Or  . ondansetron [facility]) injection 4 mg  4 mg Intravenous Q6H PRN Isaiah Blakes, PA-C      . polyethylene glycol (MIRALAX / GLYCOLAX) packet 17 g  17 g Oral  Daily PRN Isaiah Blakes, PA-C      . RESOURCE THICKENUP CLEAR   Oral PRN Richarda Overlie, MD         Discharge Medications: Please see discharge summary for a list of discharge medications.  Relevant Imaging Results:  Relevant Lab Results:   Additional Information SSN: [id number]  Maree Krabbe, LCSW

## 2016-08-02 NOTE — Progress Notes (Signed)
Rehab admissions - I spoke with son and dtr in law at the bedside and then I called the daughter by phone.  Dtr says patient and his [place] live in [place].  Daughter would like inpatient rehab here at [facility].   I have called insurance carrier and opened the case requesting acute inpatient rehab admission.  Will await call back from insurance case manager.  Call me for questions.  #[phone]

## 2016-08-02 NOTE — Progress Notes (Signed)
Rehab admissions - I have received a denial from [place] for acute inpatient rehab admission.  I met with the family and I explained the denial.  I have talked with case manager and case manager in room to discuss options.  Call me for questions.  #[phone]

## 2016-08-02 NOTE — Progress Notes (Signed)
qPhysical Therapy Treatment Patient Details Name: Clayton [name] MRN: [id number] DOB: Oct 29, 1919 Today's Date: 08/02/2016    History of Present Illness 83 year old man PMH paroxysmal atrial fibrillation, chronic slurred speech, swallowing difficulty, chronic kidney disease stage III, ICD, diabetes mellitus, admitted due to new onset aphasia.  Head CT early left parietal CVA.    PT Comments    Patient progressing this session with ambulation in hallway.  Remains unsafe, but better with rollator than with front wheeled walker.  Session limited by urinary incontinence with loose condom cath in hallway.  Patient and family frustrated with SNF plan, but working with staff for placement due to insurance denial for CIR.  Will continue skilled PT until d/c.  Follow Up Recommendations  SNF     Equipment Recommendations  None recommended by PT    Recommendations for Other Services       Precautions / Restrictions Precautions Precautions: Fall    Mobility  Bed Mobility Overal bed mobility: Needs Assistance Bed Mobility: Supine to Sit;Sit to Supine     Supine to sit: Min assist;HOB elevated Sit to supine: Mod assist   General bed mobility comments: assist to elevate trunk and bring LEs back onto bed  Transfers Overall transfer level: Needs assistance Equipment used: 4-wheeled walker Transfers: Sit to/from Stand Sit to Stand: Mod assist         General transfer comment: lifting assist from EOB and from chair in hallway  Ambulation/Gait Ambulation/Gait assistance: Min assist Ambulation Distance (Feet): 110 Feet (x 2) Assistive device: 4-wheeled walker Gait Pattern/deviations: Step-through pattern;Decreased stride length;Shuffle;Trunk flexed     General Gait Details: increased proximity to walker, kept head up, but stays flexed despite cues, at times unsafe with walker on turns    Stairs            Wheelchair Mobility    Modified Rankin (Stroke Patients  Only) Modified Rankin (Stroke Patients Only) Pre-Morbid Rankin Score: Slight disability Modified Rankin: Moderately severe disability     Balance Overall balance assessment: Needs assistance Sitting-balance support: Feet supported Sitting balance-Leahy Scale: Fair     Standing balance support: Bilateral upper extremity supported Standing balance-Leahy Scale: Poor Standing balance comment: flexed posture, anterior lean with ambulation and min A for safety                            Cognition Arousal/Alertness: Awake/alert Behavior During Therapy: Flat affect Overall Cognitive Status: Difficult to assess                                        Exercises      General Comments General comments (skin integrity, edema, etc.): Patient's condom cath loose and was incontinent of urine during ambualtion in hallway.  Assisted back to room and with gown change      Pertinent Vitals/Pain Faces Pain Scale: No hurt    Home Living                      Prior Function            PT Goals (current goals can now be found in the care plan section) Progress towards PT goals: Progressing toward goals    Frequency    Min 4X/week      PT Plan Discharge plan needs to be updated    Co-evaluation  End of Session Equipment Utilized During Treatment: Gait belt Activity Tolerance: Other (comment) (limited by urinary incontinence) Patient left: in bed;with call bell/phone within reach;with family/visitor present   PT Visit Diagnosis: Other abnormalities of gait and mobility (R26.89);Muscle weakness (generalized) (M62.81)     Time: 1914-7829 PT Time Calculation (min) (ACUTE ONLY): 27 min  Charges:  $Gait Training: 8-22 mins $Therapeutic Activity: 8-22 mins                    G CodesSheran Lawless, [place] [phone] 08/02/2016    Elray Mcgregor 08/02/2016, 4:29 PM

## 2016-08-02 NOTE — Evaluation (Signed)
Speech Language Pathology Evaluation Patient Details Name: Clayton [name] MRN: [id number] DOB: Jan 16, 1920 Today's Date: 08/02/2016 Time: 4098-1191 SLP Time Calculation (min) (ACUTE ONLY): 43 min  Problem List:  Patient Active Problem List   Diagnosis Date Noted  . Aphasia   . Aortic atherosclerosis (HCC)   . Stroke (HCC) 07/31/2016  . CKD (chronic kidney disease), stage III 07/31/2016  . Urinary retention 06/30/2015  . AKI (acute kidney injury) (HCC) 05/22/2015  . Acute renal failure superimposed on stage 3 chronic kidney disease (HCC) 02/07/2015  . Syncope 02/07/2015  . Normocytic anemia 02/07/2015  . Hyperkalemia 02/07/2015  . Diabetes mellitus without complication (HCC)   . paroxysmal afib   . Absolute anemia   . Faintness   . Absent pedal pulses 12/28/2014  . Essential hypertension, benign 06/27/2012  . Proximal humerus fracture, right 06/24/2012   Past Medical History:  Past Medical History:  Diagnosis Date  . Anemia   . Cataracts, bilateral   . CKD (chronic kidney disease), stage III   . Diabetes mellitus without complication (HCC)   . Distended bladder   . Diverticulosis    on scan  . Hard of hearing   . Hiatal hernia   . Hypertension   . Kidney stones   . Macular degeneration   . paroxysmal afib    right bundle branch block  . Syncopal episodes    Past Surgical History:  Past Surgical History:  Procedure Laterality Date  . EYE SURGERY     cataracts with lens implants  . HERNIA REPAIR    . KIDNEY STONE SURGERY     since young age  . ORIF HUMERUS FRACTURE Right 06/26/2012   Procedure: OPEN REDUCTION INTERNAL FIXATION (ORIF) PROXIMAL HUMERUS FRACTURE, right ;  Surgeon: Budd Palmer, MD;  Location: [facility] OR;  Service: Orthopedics;  Laterality: Right;  . TRANSURETHRAL RESECTION OF PROSTATE N/A 06/30/2015   Procedure: TRANSURETHRAL RESECTION OF THE PROSTATE (TURP);  Surgeon: Crist Fat, MD;  Location: [facility] ORS;  Service: Urology;  Laterality: N/A;    HPI:  83 y.o. male with baseline dysarthria and hx DMII, HTN, arrived to ED from home with aphasia, right-sided weakness.  Head CT early left parietal CVA. Hx of dysphagia, hence failed RN stroke swallow screen. Evaluated by SLP during hospitalization in [facility] Dec 2016 - clinical swallow evaluation completed and pureed diet, thin liquids recommended.  Pt had OP SLP services upon return home.     Assessment / Plan / Recommendation Clinical Impression  Pt demonstrates severe dysarthria characterized by slow initiation, very poor breath support for volume, slow, weak articulation and decreased verbal output. Difficult to determine pts level of language impairment yet as severity of dysarthria impacts intelligibility and pts willingness to participate in verbal communication. Pt has demonstrated ability to follow 2 step commands with extra time for processing and initiation and also ability to communicate wants and needs in short phrases utilizing gestures to improve listener comprehension.  Primary problem is breath support for volume and cough. Will trial EMST if pt still admitted acutely, as well as compensatory strategies to faciliate communication. Recommend CIR at d/c.     SLP Assessment  SLP Recommendation/Assessment: Patient needs continued Speech Lanaguage Pathology Services SLP Visit Diagnosis: Dysarthria and anarthria (R47.1)    Follow Up Recommendations  Inpatient Rehab    Frequency and Duration min 2x/week  2 weeks      SLP Evaluation Cognition  Overall Cognitive Status: Impaired/Different from baseline Arousal/Alertness: Awake/alert Orientation Level: Oriented  to person;Oriented to place;Disoriented to time Attention: Focused;Sustained Focused Attention: Appears intact Sustained Attention: Impaired Sustained Attention Impairment: Verbal basic;Functional basic       Comprehension  Auditory Comprehension Overall Auditory Comprehension: Impaired Yes/No Questions:  Impaired Basic Biographical Questions: 26-50% accurate Basic Immediate Environment Questions: 25-49% accurate Commands: Impaired One Step Basic Commands: 75-100% accurate Two Step Basic Commands: 75-100% accurate Complex Commands: 25-49% accurate Conversation: Simple Interfering Components: Processing speed Visual Recognition/Discrimination Discrimination: Not tested Reading Comprehension Reading Status: Not tested    Expression Verbal Expression Overall Verbal Expression: Impaired Initiation: Impaired (slow initiation) Automatic Speech: Name;Social Response Level of Generative/Spontaneous Verbalization: Phrase;Word Repetition: No impairment Naming: Impairment Responsive: Not tested Confrontation: Impaired Interfering Components: Speech intelligibility;Attention   Oral / Motor  Oral Motor/Sensory Function Overall Oral Motor/Sensory Function: Moderate impairment Facial ROM: Reduced right;Suspected CN VII (facial) dysfunction Facial Symmetry: Abnormal symmetry right;Suspected CN VII (facial) dysfunction Facial Strength: Reduced right;Suspected CN VII (facial) dysfunction Facial Sensation: Reduced right;Suspected CN V (Trigeminal) dysfunction Lingual Symmetry: Abnormal symmetry right;Suspected CN XII (hypoglossal) dysfunction Lingual Strength: Reduced;Suspected CN XII (hypoglossal) dysfunction Lingual Sensation: Reduced;Suspected CN VII (facial) dysfunction-anterior 2/3 tongue Mandible: Impaired (weak) Motor Speech Overall Motor Speech: Impaired Respiration: Impaired Level of Impairment: Word Phonation: Low vocal intensity Resonance: Within functional limits Articulation: Impaired Level of Impairment: Word Intelligibility: Intelligibility reduced Word: 25-49% accurate Phrase: 25-49% accurate Sentence: Not tested Conversation: Not tested   [employer], MA CCC-SLP [phone]  Clayton [name] 08/02/2016, 9:33 AM

## 2016-08-02 NOTE — Care Management Note (Signed)
Case Management Note  Patient Details  Name: Clayton [name] MRN: [id number] Date of Birth: [birth year]  Subjective/Objective:                    Action/Plan: Pt denied by insurance for CIR. CM met with the patient and his family. They are interested in SNF rehab. CM provided them a SNF list. They would like a rehab in [facility] area. CSW informed. CM following.   Expected Discharge Date:                  Expected Discharge Plan:  Skilled Nursing Facility  In-House Referral:  Clinical Social Work  Discharge planning Services  CM Consult  Post Acute Care Choice:    Choice offered to:     DME Arranged:    DME Agency:     HH Arranged:    [place] Agency:     Status of Service:  In process, will continue to follow  If discussed at Long Length of Stay Meetings, dates discussed:    Additional Comments:  Pollie Friar, RN 08/02/2016, 3:42 PM

## 2016-08-02 NOTE — Progress Notes (Signed)
  Speech Language Pathology Treatment: Dysphagia  Patient Details Name: Clayton [name] MRN: [id number] DOB: August 03, 1919 Today's Date: 08/02/2016 Time: 8295-6213 SLP Time Calculation (min) (ACUTE ONLY): 43 min  Assessment / Plan / Recommendation Clinical Impression  Pt seen with am meal. SLP repositioned pt, provided max verbal and tactile cues to facilitate oral transit and swallow initiation with grits and pureed solids. Pt consumed most of the grits but would not swallow pureed french toast. Likely this is due to dislike, but also the texture of the puree; grits are a more fluid texture that are more easily transited. AFter solids were given SLP cleared mouth and brushed teeth to prepare pt for sips of water. Pt struggled with this despite max assist. Cups sips resulted in too large a bolus even with SLP control. Repositioned pts head with a towel for a slight forward head position and then gave sips of water with a spoon and straw with pressure to lower lip for oral seal. Pts tolerance of water improved with these interventions.  Will add that pt may have honey thick liquids with meals to maximize nutrition and fluid intake, though again there is risk of aspiration of residuals with all textures.    HPI HPI: 83 y.o. male with baseline dysarthria and hx DMII, HTN, arrived to ED from home with aphasia, right-sided weakness.  Head CT early left parietal CVA. Hx of dysphagia, hence failed RN stroke swallow screen. Evaluated by SLP during hospitalization in [facility] Dec 2016 - clinical swallow evaluation completed and pureed diet, thin liquids recommended.  Pt had OP SLP services upon return home.        SLP Plan  Continue with current plan of care       Recommendations  Diet recommendations: Dysphagia 1 (puree);Honey-thick liquid;Thin liquid (thin water only) Liquids provided via: Cup;Teaspoon;Straw Medication Administration: Crushed with puree Supervision: Full supervision/cueing for  compensatory strategies Compensations: Slow rate;Small sips/bites;Minimize environmental distractions;Multiple dry swallows after each bite/sip;Clear throat intermittently Postural Changes and/or Swallow Maneuvers: Seated upright 90 degrees;Upright 30-60 min after meal                Oral Care Recommendations: Oral care prior to ice chip/H20 Follow up Recommendations: Inpatient Rehab SLP Visit Diagnosis: Dysphagia, oropharyngeal phase (R13.12) Plan: Continue with current plan of care       GO                Kaleia Longhi, Riley Nearing 08/02/2016, 9:16 AM

## 2016-08-02 NOTE — Progress Notes (Signed)
[facility] PROGRESS NOTE  Clayton [name] VZC:[id number] DOB: 1919/05/27 DOA: 07/31/2016   PCP: Gwen Pounds, MD     Assessment/Plan: Principal Problem:   Stroke (HCC) Active Problems:   Essential hypertension, benign   Diabetes mellitus without complication (HCC)   paroxysmal afib   CKD (chronic kidney disease), stage III   Aphasia   Aortic atherosclerosis (HCC)   82 y.o. male with medical history significant of DM type II,  CKD, HTN, PAF, anemia who presented to the ED via EMS with c/o slurred speech and right sided weakness. Admitted for stroke workup. MRI positive for stroke  Assessment and plan Acute ischemic stroke Patient was seen by neuro team MRI Acute multifocal small LEFT frontal lobe infarcts (MCA territory) CT head and neck- No emergent large vessel occlusion Continue aspirin, stroke team following and will reassess need for antiplatelet rx  Hemoglobin A1c 7.2 LDL 90, HDL 41, triglycerides 134 PT/OT-CIR/speech therapy  Dysphagia 1 (puree);Honey-thick liquid;Thin liquid (thin water only) 2-D echo EF 55-60% Patient has a history of paroxysmal /transient atrial fibrillation on anticoagulation at home? Recommend 30 day cardiac event monitoring as outpt to rule out afib ASA 325mg  and plavix 75mg  for 3 months and then plavix alone.    Hypertension Continue home meds with permissive hypertension up to 220/110 mmhg  Paroxysmal AFib - not on any anticoagulation therapy Patient will be placed on regular dose aspirin and plavix per neuro  DM type II - most recent HgbA1C is 6.4% on 06/22/16  Hold Metformin while hospitalized Continue diabetic diet, monitor FSBS QACHS, add sliding scale insulin  UTI Continue Rocephin and follow urine culture  CK D stage III-baseline around 1.4-1.5, creatinine stable at this time   DVT prophylaxsis heparin  Code Status:  DNR     Family Communication: Called patient's daughter and wife, son and daughter-in-law  by the bedside and discussed current findings with them Disposition Plan:   CIR    Consultants:  Neurology  Procedures:  None  Antibiotics: Anti-infectives    Start     Dose/Rate Route Frequency Ordered Stop   07/31/16 1900  cefTRIAXone (ROCEPHIN) 1 g in dextrose 5 % 50 mL IVPB     1 g 100 mL/hr over 30 Minutes Intravenous Every 24 hours 07/31/16 1845           HPI/Subjective: Patient continues to have significant expressive aphasia/dysarthria   Objective: Vitals:   08/01/16 2105 08/02/16 0120 08/02/16 0414 08/02/16 0903  BP: 121/61 (!) 162/78 (!) 168/69 136/62  Pulse: 71 79 63 64  Resp: 18 18 18 20   Temp: 98 F (36.7 C) 98 F (36.7 C) 98.1 F (36.7 C) 98.7 F (37.1 C)  TempSrc: Oral Axillary Oral Oral  SpO2: 95% 95% 95% 96%  Weight:      Height:        Intake/Output Summary (Last 24 hours) at 08/02/16 1029 Last data filed at 08/02/16 1287  Gross per 24 hour  Intake            967.5 ml  Output              750 ml  Net            217.5 ml    Exam:  Examination:  General exam: Appears calm and comfortable  Respiratory system: Clear to auscultation. Respiratory effort normal. Cardiovascular system: S1 & S2 heard, RRR. No JVD, murmurs, rubs, gallops or clicks. No pedal edema. Gastrointestinal system:  Abdomen is nondistended, soft and nontender. No organomegaly or masses felt. Normal bowel sounds heard. Central nervous system: Awake,expressive dysarthria Extremities: Symmetric 5 x 5 power. Skin: No rashes, lesions or ulcers Psychiatry: Judgement impaired.     Data Reviewed: I have personally reviewed following labs and imaging studies  Micro Results No results found for this or any previous visit (from the past 240 hour(s)).  Radiology Reports Ct Angio Head W Or Wo Contrast  Result Date: 07/31/2016 CLINICAL DATA:  Aphasia.  Right arm and leg drift. EXAM: CT ANGIOGRAPHY HEAD AND NECK TECHNIQUE: Multidetector CT imaging of the head and neck was  performed using the standard protocol during bolus administration of intravenous contrast. Multiplanar CT image reconstructions and MIPs were obtained to evaluate the vascular anatomy. Carotid stenosis measurements (when applicable) are obtained utilizing NASCET criteria, using the distal internal carotid diameter as the denominator. CONTRAST:  50 mL Isovue 370 COMPARISON:  Head CT 07/31/2016 and MRI 02/07/2015. No prior angiographic imaging. FINDINGS: CTA NECK FINDINGS Aortic arch: Standard 3 vessel aortic arch with heavy calcified and irregular soft plaque projecting into the lumen. Non-stenotic plaque in the brachiocephalic and right subclavian arteries. Mild narrowing of the proximal left subclavian artery due to soft and calcified plaque. Right carotid system: Moderate soft and calcified plaque in the mid to distal the common carotid artery without significant stenosis. Extensive, heavily calcified plaque at the bifurcation and carotid bulb results in approximately 50% proximal ICA stenosis. Left carotid system: Mild-to-moderate calcified and soft plaque in the mid to distal common carotid artery without significant stenosis. Eccentric, calcified plaque in the proximal ICA results in 30% stenosis. There is moderate proximal ECA stenosis due to soft plaque. Vertebral arteries: Patent with the left being slightly larger than the right. No evidence of dissection or significant stenosis. Slight irregularity of the V3 segments bilaterally for which mild fibromuscular dysplasia is not excluded. Skeleton: Multilevel cervical disc and facet degeneration. Interbody and posterior element ankylosis at C3-4, C4-5, and C6-7. Other neck: No mass or lymph node enlargement. Upper chest: Partially visualized mosaic attenuation in the left upper lobe, possibly air trapping. Review of the MIP images confirms the above findings CTA HEAD FINDINGS Anterior circulation: The internal carotid arteries are patent from skullbase to  carotid termini with diffuse siphon atherosclerosis bilaterally. There is mild cavernous and supraclinoid segment stenosis on the left. The MCAs are patent without evidence of major branch occlusion or significant proximal stenosis. There is an early MCA bifurcation on the left. There is mild asymmetric attenuation of distal left MCA branch vessels. The ACAs are patent without evidence of major branch occlusion or significant proximal stenosis. The left A1 segment is hypoplastic. No aneurysm. Posterior circulation: The intracranial vertebral arteries are patent with mild non stenotic calcified plaque in the left V4 segment. Patent PICA, AICA, and SCA origins are visualized bilaterally. The basilar artery is patent without stenosis. There is a prominent right posterior communicating artery with markedly hypoplastic or absent/ occluded right P1 segment. No significant PCA stenosis is identified. No aneurysm. Venous sinuses: Not well evaluated due to contrast timing. Anatomic variants: Fetal origin of the right PCA. Hypoplastic left A1. Delayed phase: Not performed. Review of the MIP images confirms the above findings IMPRESSION: 1. No emergent large vessel occlusion. 2. Advanced aortic atherosclerosis. 3. 50% proximal right ICA stenosis. 4. 30% proximal left ICA stenosis. 5. Mild intracranial left ICA stenosis. Preliminary findings of no large vessel occlusion were called by telephone at the time of interpretation  on 07/31/2016 at 10:45 am to Dr. Amada Jupiter, who verbally acknowledged these results. Electronically Signed   By: Sebastian Ache M.D.   On: 07/31/2016 11:09   Ct Angio Neck W Or Wo Contrast  Result Date: 07/31/2016 CLINICAL DATA:  Aphasia.  Right arm and leg drift. EXAM: CT ANGIOGRAPHY HEAD AND NECK TECHNIQUE: Multidetector CT imaging of the head and neck was performed using the standard protocol during bolus administration of intravenous contrast. Multiplanar CT image reconstructions and MIPs were  obtained to evaluate the vascular anatomy. Carotid stenosis measurements (when applicable) are obtained utilizing NASCET criteria, using the distal internal carotid diameter as the denominator. CONTRAST:  50 mL Isovue 370 COMPARISON:  Head CT 07/31/2016 and MRI 02/07/2015. No prior angiographic imaging. FINDINGS: CTA NECK FINDINGS Aortic arch: Standard 3 vessel aortic arch with heavy calcified and irregular soft plaque projecting into the lumen. Non-stenotic plaque in the brachiocephalic and right subclavian arteries. Mild narrowing of the proximal left subclavian artery due to soft and calcified plaque. Right carotid system: Moderate soft and calcified plaque in the mid to distal the common carotid artery without significant stenosis. Extensive, heavily calcified plaque at the bifurcation and carotid bulb results in approximately 50% proximal ICA stenosis. Left carotid system: Mild-to-moderate calcified and soft plaque in the mid to distal common carotid artery without significant stenosis. Eccentric, calcified plaque in the proximal ICA results in 30% stenosis. There is moderate proximal ECA stenosis due to soft plaque. Vertebral arteries: Patent with the left being slightly larger than the right. No evidence of dissection or significant stenosis. Slight irregularity of the V3 segments bilaterally for which mild fibromuscular dysplasia is not excluded. Skeleton: Multilevel cervical disc and facet degeneration. Interbody and posterior element ankylosis at C3-4, C4-5, and C6-7. Other neck: No mass or lymph node enlargement. Upper chest: Partially visualized mosaic attenuation in the left upper lobe, possibly air trapping. Review of the MIP images confirms the above findings CTA HEAD FINDINGS Anterior circulation: The internal carotid arteries are patent from skullbase to carotid termini with diffuse siphon atherosclerosis bilaterally. There is mild cavernous and supraclinoid segment stenosis on the left. The MCAs  are patent without evidence of major branch occlusion or significant proximal stenosis. There is an early MCA bifurcation on the left. There is mild asymmetric attenuation of distal left MCA branch vessels. The ACAs are patent without evidence of major branch occlusion or significant proximal stenosis. The left A1 segment is hypoplastic. No aneurysm. Posterior circulation: The intracranial vertebral arteries are patent with mild non stenotic calcified plaque in the left V4 segment. Patent PICA, AICA, and SCA origins are visualized bilaterally. The basilar artery is patent without stenosis. There is a prominent right posterior communicating artery with markedly hypoplastic or absent/ occluded right P1 segment. No significant PCA stenosis is identified. No aneurysm. Venous sinuses: Not well evaluated due to contrast timing. Anatomic variants: Fetal origin of the right PCA. Hypoplastic left A1. Delayed phase: Not performed. Review of the MIP images confirms the above findings IMPRESSION: 1. No emergent large vessel occlusion. 2. Advanced aortic atherosclerosis. 3. 50% proximal right ICA stenosis. 4. 30% proximal left ICA stenosis. 5. Mild intracranial left ICA stenosis. Preliminary findings of no large vessel occlusion were called by telephone at the time of interpretation on 07/31/2016 at 10:45 am to Dr. Amada Jupiter, who verbally acknowledged these results. Electronically Signed   By: Sebastian Ache M.D.   On: 07/31/2016 11:09   Mr Brain Wo Contrast  Result Date: 07/31/2016 CLINICAL DATA:  Slurred  speech and RIGHT-sided weakness, last seen normal last night. History of diabetes, hypertension. EXAM: MRI HEAD WITHOUT CONTRAST TECHNIQUE: Multiplanar, multiecho pulse sequences of the brain and surrounding structures were obtained without intravenous contrast. COMPARISON:  CTA HEAD and neck July 31, 2016 at 1034 hours. FINDINGS: BRAIN: Faint patchy reduced diffusion LEFT frontal lobe including the operculum.  Corresponding low ADC values with faint susceptibility artifact LEFT frontal cortex. Moderate to severe ventriculomegaly on the basis of global parenchymal brain volume loss. Confluent supratentorial white matter FLAIR T2 hyperintensities. No midline shift, mass effect or masses. Old RIGHT caudate head lacunar infarct. No abnormal extra-axial fluid collections. VASCULAR: Major intracranial vascular flow voids present at skull base, dolichoectasia associated with chronic hypertension. SKULL AND UPPER CERVICAL SPINE: No abnormal sellar expansion. No suspicious calvarial bone marrow signal. Craniocervical junction maintained. SINUSES/ORBITS: The mastoid air-cells and included paranasal sinuses are well-aerated. The included ocular globes and orbital contents are non-suspicious. Status post bilateral ocular lens implants. OTHER: None. IMPRESSION: Acute multifocal small LEFT frontal lobe infarcts (MCA territory) with faint petechial hemorrhage. Chronic changes include old RIGHT caudate head lacunar infarct and moderate chronic small vessel ischemic disease. Electronically Signed   By: Awilda Metro M.D.   On: 07/31/2016 19:57   Ct Head Code Stroke Wo Contrast`  Result Date: 07/31/2016 CLINICAL DATA:  Code stroke.  Aphasia.  Right arm and leg drift. EXAM: CT HEAD WITHOUT CONTRAST TECHNIQUE: Contiguous axial images were obtained from the base of the skull through the vertex without intravenous contrast. COMPARISON:  Brain MRI 02/07/2015 FINDINGS: Brain: There is no evidence of acute intracranial hemorrhage, mass, midline shift, or extra-axial fluid collection. There is age related cerebral atrophy. Patchy to confluent bilateral cerebral white matter hypodensities are nonspecific but compatible with moderate chronic small vessel ischemic disease. There is mildly asymmetric white matter hypoattenuation at the level of the posterior left corona radiata (series 201, image 21) with possible early loss of gray- white  differentiation involving the overlying cortex. There is a chronic lacunar infarct in the right caudate nucleus. Vascular: Calcified atherosclerosis at the skullbase. No proximal dense MCA. Slightly increased density of a small MCA branch vessel posteriorly in the left sylvian fissure, which may represent a distal M2/ proximal M3 embolus versus atherosclerosis. Skull: No fracture or focal osseous lesion. Sinuses/Orbits: Bilateral cataract extraction. No significant sinus disease. Other: None. ASPECTS [facility] Stroke Program Early CT Score) - Ganglionic level infarction (caudate, lentiform nuclei, internal capsule, insula, M1-M3 cortex): 7 - Supraganglionic infarction (M4-M6 cortex): 2 Total score (0-10 with 10 being normal): 9 IMPRESSION: 1. No acute intracranial hemorrhage. 2. Possible early left parietal lobe infarct. 3. ASPECTS is 9. 4. Moderate chronic small vessel ischemic disease. These results were called by telephone at the time of interpretation on 07/31/2016 at 10:30 am to Dr. Amada Jupiter, who verbally acknowledged these results. Electronically Signed   By: Sebastian Ache M.D.   On: 07/31/2016 10:39     CBC  Recent Labs Lab 07/31/16 0952 07/31/16 1010 08/01/16 0526  WBC 9.2  --  9.6  HGB 13.3 13.6 12.5*  HCT 40.5 40.0 38.2*  PLT 211  --  219  MCV 96.2  --  96.5  MCH 31.6  --  31.6  MCHC 32.8  --  32.7  RDW 13.2  --  13.1  LYMPHSABS 1.3  --   --   MONOABS 0.7  --   --   EOSABS 0.5  --   --   BASOSABS 0.1  --   --  Chemistries   Recent Labs Lab 07/31/16 0952 07/31/16 1010 08/01/16 0526  NA 138 139 140  K 4.5 4.5 4.1  CL 102 103 106  CO2 25  --  24  GLUCOSE 155* 154* 122*  BUN 28* 32* 24*  CREATININE 1.41* 1.40* 1.30*  CALCIUM 9.6  --  9.0  AST 18  --   --   ALT 11*  --   --   ALKPHOS 78  --   --   BILITOT 0.7  --   --    ------------------------------------------------------------------------------------------------------------------ estimated creatinine clearance  is 31.4 mL/min (A) (by C-G formula based on SCr of 1.3 mg/dL (H)). ------------------------------------------------------------------------------------------------------------------  Recent Labs  08/01/16 0526  HGBA1C 7.2*   ------------------------------------------------------------------------------------------------------------------  Recent Labs  08/01/16 0526  CHOL 158  HDL 41  LDLCALC 90  TRIG 134  CHOLHDL 3.9   ------------------------------------------------------------------------------------------------------------------ No results for input(s): TSH, T4TOTAL, T3FREE, THYROIDAB in the last 72 hours.  Invalid input(s): FREET3 ------------------------------------------------------------------------------------------------------------------ No results for input(s): VITAMINB12, FOLATE, FERRITIN, TIBC, IRON, RETICCTPCT in the last 72 hours.  Coagulation profile  Recent Labs Lab 07/31/16 0952  INR 1.02    No results for input(s): DDIMER in the last 72 hours.  Cardiac Enzymes No results for input(s): CKMB, TROPONINI, MYOGLOBIN in the last 168 hours.  Invalid input(s): CK ------------------------------------------------------------------------------------------------------------------ Invalid input(s): POCBNP   CBG:  Recent Labs Lab 08/01/16 1109 08/01/16 1655 08/01/16 1932 08/02/16 0007 08/02/16 0620  GLUCAP 128* 126* 141* 212* 123*       Studies: Ct Angio Head W Or Wo Contrast  Result Date: 07/31/2016 CLINICAL DATA:  Aphasia.  Right arm and leg drift. EXAM: CT ANGIOGRAPHY HEAD AND NECK TECHNIQUE: Multidetector CT imaging of the head and neck was performed using the standard protocol during bolus administration of intravenous contrast. Multiplanar CT image reconstructions and MIPs were obtained to evaluate the vascular anatomy. Carotid stenosis measurements (when applicable) are obtained utilizing NASCET criteria, using the distal internal carotid  diameter as the denominator. CONTRAST:  50 mL Isovue 370 COMPARISON:  Head CT 07/31/2016 and MRI 02/07/2015. No prior angiographic imaging. FINDINGS: CTA NECK FINDINGS Aortic arch: Standard 3 vessel aortic arch with heavy calcified and irregular soft plaque projecting into the lumen. Non-stenotic plaque in the brachiocephalic and right subclavian arteries. Mild narrowing of the proximal left subclavian artery due to soft and calcified plaque. Right carotid system: Moderate soft and calcified plaque in the mid to distal the common carotid artery without significant stenosis. Extensive, heavily calcified plaque at the bifurcation and carotid bulb results in approximately 50% proximal ICA stenosis. Left carotid system: Mild-to-moderate calcified and soft plaque in the mid to distal common carotid artery without significant stenosis. Eccentric, calcified plaque in the proximal ICA results in 30% stenosis. There is moderate proximal ECA stenosis due to soft plaque. Vertebral arteries: Patent with the left being slightly larger than the right. No evidence of dissection or significant stenosis. Slight irregularity of the V3 segments bilaterally for which mild fibromuscular dysplasia is not excluded. Skeleton: Multilevel cervical disc and facet degeneration. Interbody and posterior element ankylosis at C3-4, C4-5, and C6-7. Other neck: No mass or lymph node enlargement. Upper chest: Partially visualized mosaic attenuation in the left upper lobe, possibly air trapping. Review of the MIP images confirms the above findings CTA HEAD FINDINGS Anterior circulation: The internal carotid arteries are patent from skullbase to carotid termini with diffuse siphon atherosclerosis bilaterally. There is mild cavernous and supraclinoid segment stenosis on the left. The MCAs are  patent without evidence of major branch occlusion or significant proximal stenosis. There is an early MCA bifurcation on the left. There is mild asymmetric  attenuation of distal left MCA branch vessels. The ACAs are patent without evidence of major branch occlusion or significant proximal stenosis. The left A1 segment is hypoplastic. No aneurysm. Posterior circulation: The intracranial vertebral arteries are patent with mild non stenotic calcified plaque in the left V4 segment. Patent PICA, AICA, and SCA origins are visualized bilaterally. The basilar artery is patent without stenosis. There is a prominent right posterior communicating artery with markedly hypoplastic or absent/ occluded right P1 segment. No significant PCA stenosis is identified. No aneurysm. Venous sinuses: Not well evaluated due to contrast timing. Anatomic variants: Fetal origin of the right PCA. Hypoplastic left A1. Delayed phase: Not performed. Review of the MIP images confirms the above findings IMPRESSION: 1. No emergent large vessel occlusion. 2. Advanced aortic atherosclerosis. 3. 50% proximal right ICA stenosis. 4. 30% proximal left ICA stenosis. 5. Mild intracranial left ICA stenosis. Preliminary findings of no large vessel occlusion were called by telephone at the time of interpretation on 07/31/2016 at 10:45 am to Dr. Amada Jupiter, who verbally acknowledged these results. Electronically Signed   By: Sebastian Ache M.D.   On: 07/31/2016 11:09   Ct Angio Neck W Or Wo Contrast  Result Date: 07/31/2016 CLINICAL DATA:  Aphasia.  Right arm and leg drift. EXAM: CT ANGIOGRAPHY HEAD AND NECK TECHNIQUE: Multidetector CT imaging of the head and neck was performed using the standard protocol during bolus administration of intravenous contrast. Multiplanar CT image reconstructions and MIPs were obtained to evaluate the vascular anatomy. Carotid stenosis measurements (when applicable) are obtained utilizing NASCET criteria, using the distal internal carotid diameter as the denominator. CONTRAST:  50 mL Isovue 370 COMPARISON:  Head CT 07/31/2016 and MRI 02/07/2015. No prior angiographic imaging.  FINDINGS: CTA NECK FINDINGS Aortic arch: Standard 3 vessel aortic arch with heavy calcified and irregular soft plaque projecting into the lumen. Non-stenotic plaque in the brachiocephalic and right subclavian arteries. Mild narrowing of the proximal left subclavian artery due to soft and calcified plaque. Right carotid system: Moderate soft and calcified plaque in the mid to distal the common carotid artery without significant stenosis. Extensive, heavily calcified plaque at the bifurcation and carotid bulb results in approximately 50% proximal ICA stenosis. Left carotid system: Mild-to-moderate calcified and soft plaque in the mid to distal common carotid artery without significant stenosis. Eccentric, calcified plaque in the proximal ICA results in 30% stenosis. There is moderate proximal ECA stenosis due to soft plaque. Vertebral arteries: Patent with the left being slightly larger than the right. No evidence of dissection or significant stenosis. Slight irregularity of the V3 segments bilaterally for which mild fibromuscular dysplasia is not excluded. Skeleton: Multilevel cervical disc and facet degeneration. Interbody and posterior element ankylosis at C3-4, C4-5, and C6-7. Other neck: No mass or lymph node enlargement. Upper chest: Partially visualized mosaic attenuation in the left upper lobe, possibly air trapping. Review of the MIP images confirms the above findings CTA HEAD FINDINGS Anterior circulation: The internal carotid arteries are patent from skullbase to carotid termini with diffuse siphon atherosclerosis bilaterally. There is mild cavernous and supraclinoid segment stenosis on the left. The MCAs are patent without evidence of major branch occlusion or significant proximal stenosis. There is an early MCA bifurcation on the left. There is mild asymmetric attenuation of distal left MCA branch vessels. The ACAs are patent without evidence of major branch occlusion  or significant proximal stenosis. The  left A1 segment is hypoplastic. No aneurysm. Posterior circulation: The intracranial vertebral arteries are patent with mild non stenotic calcified plaque in the left V4 segment. Patent PICA, AICA, and SCA origins are visualized bilaterally. The basilar artery is patent without stenosis. There is a prominent right posterior communicating artery with markedly hypoplastic or absent/ occluded right P1 segment. No significant PCA stenosis is identified. No aneurysm. Venous sinuses: Not well evaluated due to contrast timing. Anatomic variants: Fetal origin of the right PCA. Hypoplastic left A1. Delayed phase: Not performed. Review of the MIP images confirms the above findings IMPRESSION: 1. No emergent large vessel occlusion. 2. Advanced aortic atherosclerosis. 3. 50% proximal right ICA stenosis. 4. 30% proximal left ICA stenosis. 5. Mild intracranial left ICA stenosis. Preliminary findings of no large vessel occlusion were called by telephone at the time of interpretation on 07/31/2016 at 10:45 am to Dr. Amada Jupiter, who verbally acknowledged these results. Electronically Signed   By: Sebastian Ache M.D.   On: 07/31/2016 11:09   Mr Brain Wo Contrast  Result Date: 07/31/2016 CLINICAL DATA:  Slurred speech and RIGHT-sided weakness, last seen normal last night. History of diabetes, hypertension. EXAM: MRI HEAD WITHOUT CONTRAST TECHNIQUE: Multiplanar, multiecho pulse sequences of the brain and surrounding structures were obtained without intravenous contrast. COMPARISON:  CTA HEAD and neck July 31, 2016 at 1034 hours. FINDINGS: BRAIN: Faint patchy reduced diffusion LEFT frontal lobe including the operculum. Corresponding low ADC values with faint susceptibility artifact LEFT frontal cortex. Moderate to severe ventriculomegaly on the basis of global parenchymal brain volume loss. Confluent supratentorial white matter FLAIR T2 hyperintensities. No midline shift, mass effect or masses. Old RIGHT caudate head lacunar infarct.  No abnormal extra-axial fluid collections. VASCULAR: Major intracranial vascular flow voids present at skull base, dolichoectasia associated with chronic hypertension. SKULL AND UPPER CERVICAL SPINE: No abnormal sellar expansion. No suspicious calvarial bone marrow signal. Craniocervical junction maintained. SINUSES/ORBITS: The mastoid air-cells and included paranasal sinuses are well-aerated. The included ocular globes and orbital contents are non-suspicious. Status post bilateral ocular lens implants. OTHER: None. IMPRESSION: Acute multifocal small LEFT frontal lobe infarcts (MCA territory) with faint petechial hemorrhage. Chronic changes include old RIGHT caudate head lacunar infarct and moderate chronic small vessel ischemic disease. Electronically Signed   By: Awilda Metro M.D.   On: 07/31/2016 19:57      Lab Results  Component Value Date   HGBA1C 7.2 (H) 08/01/2016   HGBA1C 6.4 (H) 06/23/2015   HGBA1C 7.3 (H) 02/07/2015   Lab Results  Component Value Date   LDLCALC 90 08/01/2016   CREATININE 1.30 (H) 08/01/2016       Scheduled Meds: .  stroke: mapping our early stages of recovery book   Does not apply Once  . aspirin  300 mg Rectal Daily  . atorvastatin  20 mg Oral q1800  . cefTRIAXone (ROCEPHIN)  IV  1 g Intravenous Q24H  . heparin  5,000 Units Subcutaneous Q8H  . insulin aspart  0-9 Units Subcutaneous TID WC   Continuous Infusions: . sodium chloride 75 mL/hr at 08/02/16 0045     LOS: 2 days    Time spent: >30 MINS    [facility]  [facility] Pager [phone]. If 7PM-7AM, please contact night-coverage at [email or web address], password [facility] 08/02/2016, 10:29 AM  LOS: 2 days

## 2016-08-02 NOTE — Progress Notes (Signed)
STROKE TEAM PROGRESS NOTE   SUBJECTIVE (INTERVAL HISTORY) Pt son and daughter in law are at bedside. Pt still has severe dysarthria and expressive aphasia. No acute event overnight. Passed swallow on puree diet.   OBJECTIVE Temp:  [97.8 F (36.6 C)-98.7 F (37.1 C)] 97.8 F (36.6 C) (03/30 1300) Pulse Rate:  [63-79] 67 (03/30 1300) Cardiac Rhythm: Atrial fibrillation;Bundle branch block (03/30 0700) Resp:  [16-20] 18 (03/30 1300) BP: (121-171)/(61-90) 140/72 (03/30 1300) SpO2:  [95 %-98 %] 98 % (03/30 1300)  CBC:   Recent Labs Lab 07/31/16 0952 07/31/16 1010 08/01/16 0526  WBC 9.2  --  9.6  NEUTROABS 6.6  --   --   HGB 13.3 13.6 12.5*  HCT 40.5 40.0 38.2*  MCV 96.2  --  96.5  PLT 211  --  219    Basic Metabolic Panel:   Recent Labs Lab 07/31/16 0952 07/31/16 1010 08/01/16 0526  NA 138 139 140  K 4.5 4.5 4.1  CL 102 103 106  CO2 25  --  24  GLUCOSE 155* 154* 122*  BUN 28* 32* 24*  CREATININE 1.41* 1.40* 1.30*  CALCIUM 9.6  --  9.0    Lipid Panel:     Component Value Date/Time   CHOL 158 08/01/2016 0526   TRIG 134 08/01/2016 0526   HDL 41 08/01/2016 0526   CHOLHDL 3.9 08/01/2016 0526   VLDL 27 08/01/2016 0526   LDLCALC 90 08/01/2016 0526   HgbA1c:  Lab Results  Component Value Date   HGBA1C 7.2 (H) 08/01/2016   Urine Drug Screen: No results found for: LABOPIA, COCAINSCRNUR, LABBENZ, AMPHETMU, THCU, LABBARB    IMAGING I have personally reviewed the radiological images below and agree with the radiology interpretations.  Ct Head Code Stroke Wo Contrast` 07/31/2016 1. No acute intracranial hemorrhage. 2. Possible early left parietal lobe infarct. 3. ASPECTS is 9. 4. Moderate chronic small vessel ischemic disease.   Ct Angio Head W Or Wo Contrast Ct Angio Neck W Or Wo Contrast 07/31/2016 1. No emergent large vessel occlusion. 2. Advanced aortic atherosclerosis. 3. 50% proximal right ICA stenosis. 4. 30% proximal left ICA stenosis. 5. Mild  intracranial left ICA stenosis.   Mr Brain Wo Contrast 07/31/2016 Acute multifocal small LEFT frontal lobe infarcts (MCA territory) with faint petechial hemorrhage. Chronic changes include old RIGHT caudate head lacunar infarct and moderate chronic small vessel ischemic disease.   TTE  Left ventricle: The cavity size was normal. Wall thickness was   increased in a pattern of moderate LVH. There was focal basal   hypertrophy. Systolic function was normal. The estimated ejection   fraction was in the range of 55% to 60%. Wall motion was normal;   there were no regional wall motion abnormalities. Doppler   parameters are consistent with abnormal left ventricular   relaxation (grade 1 diastolic dysfunction). - Aortic valve: There was very mild stenosis. Valve area (VTI):   1.69 cm^2. Valve area (Vmax): 1.61 cm^2. Valve area (Vmean): 1.63   cm^2. - Mitral valve: Mildly calcified annulus. Mildly thickened, mildly   calcified leaflets . There was moderate regurgitation. - Right atrium: The atrium was mildly dilated.   PHYSICAL EXAM  Temp:  [97.8 F (36.6 C)-98.7 F (37.1 C)] 97.8 F (36.6 C) (03/30 1300) Pulse Rate:  [63-79] 67 (03/30 1300) Resp:  [16-20] 18 (03/30 1300) BP: (121-171)/(61-90) 140/72 (03/30 1300) SpO2:  [95 %-98 %] 98 % (03/30 1300)  General - Well nourished, well developed, in no apparent  distress.  Ophthalmologic - Fundi not visualized due to noncooperation.  Cardiovascular - Regular rate and rhythm.  Mental Status -  Level of arousal and orientation to month, place and people intact, however, not orientated to year and age. Language exam showed following all simple commands, but severe dysarthria and partial expressive aphasia, able to have single words out but not whole sentences, able to repeat 3 words sentence but not any longer sentences, naming 1/3.   Cranial Nerves II - XII - II - blinking to visual threat bilaterally. III, IV, VI - Extraocular movements  intact, attending to both sides. V - Facial sensation intact bilaterally. VII - right mild nasolabial fold flattening. VIII - Hearing & vestibular intact bilaterally. X - Palate elevates symmetrically. XI - Chin turning & shoulder shrug intact bilaterally. XII - Tongue protrusion intact.  Motor Strength - The patient's strength was normal in all extremities and pronator drift was absent.  Bulk was normal and fasciculations were absent.   Motor Tone - Muscle tone was assessed at the neck and appendages and was normal.  Reflexes - The patient's reflexes were 1+ in all extremities and he had no pathological reflexes.  Sensory - Light touch, temperature/pinprick were assessed and were symmetrical.    Coordination - The patient had normal movements in the hands with no ataxia or dysmetria.  Tremor was absent.  Gait and Station - deferred.   ASSESSMENT/PLAN Mr. KOU GUCCIARDO is a 83 y.o. male with history of AF not on AC, HTN, HLD and DM presenting with aphasia. He did not receive IV t-PA due to delay in arrival.   Stroke:  Small L MCA territory infarcts embolic likely secondary to large vessel atherosclerosis. However, pt did have remote transient afib in the setting of acute pancreatitis, therefore recommend cardiac monitoring to rule out afib.  Resultant  Severe dysarthria and partial expressive aphasia  Code stroke CT no acute hmg. Possible early L parietal infarct. small vessel disease.  Aspects 9.   CTA head & neck no LVO. Severe aortic atheroma. R ICA 50%, L ICA 30%. Mild IC L ICA stenosis.  MRI  Acute small L frontal lobe (MCA) infarcts with petechial hemorrhage. Old R caudate lacune.   Recommend 30 day cardiac event monitoring as outpt to rule out afib  2D Echo  EF 55-60%  LDL 90  HgbA1c 7.2  Heparin 5000 units sq tid for VTE prophylaxis DIET - DYS 1 Room service appropriate? Yes; Fluid consistency: Honey Thick  No antithrombotic prior to admission, now on ASA   and plavix  for 3 months and then plavix alone.   Patient counseled to be compliant with his antithrombotic medications  Ongoing aggressive stroke risk factor management  Therapy recommendations:  CIR  Disposition:  pending   Severe aortic athroma  CTA neck confirmed severe aortic atheroma  Likely the cause of embolic stroke  Stroke risk factor modification  On DAPT for 3 months and then plavix alone  Continue statin  Transient atrial fibrillation in the setting of acute pancreatitis 25 years ago  Not sure if pt still has paroxysmal afib  Pt had followed up with Dr. Allyson Sabal in the past for PVD but not for heart problem  Pt and family denies any arrhythmia since then  Recommend 30 day cardiac event monitoring as outpt to rule out afib    Hypertension  Stable  Hyperlipidemia  Home meds:  No statin  LDL 90, goal < 70  On lipitor   Continue statin at discharge  Diabetes  HgbA1c 7.2, goal < 7.0  Uncontrolled  SSI  CBG monitoring  Other Stroke Risk Factors  Advanced age  PVD - followed with Dr. Allyson Sabal   Former Cigarette smoker  Other Active Problems    Hospital day # 2  Neurology will sign off. Please call with questions. Pt will follow up with Darrol Angel NP at [facility] in about 6 weeks. Thanks for the consult.   Marvel Plan, MD PhD Stroke Neurology 08/02/2016 3:51 PM    To contact Stroke Continuity provider, please refer to [email or web address]. After hours, contact General Neurology

## 2016-08-03 ENCOUNTER — Encounter (HOSPITAL_COMMUNITY): Payer: Self-pay | Admitting: General Practice

## 2016-08-03 LAB — COMPREHENSIVE METABOLIC PANEL
ALBUMIN: 3.3 g/dL — AB (ref 3.5–5.0)
ALK PHOS: 67 U/L (ref 38–126)
ALT: 12 U/L — AB (ref 17–63)
AST: 19 U/L (ref 15–41)
Anion gap: 11 (ref 5–15)
BUN: 19 mg/dL (ref 6–20)
CALCIUM: 8.6 mg/dL — AB (ref 8.9–10.3)
CHLORIDE: 107 mmol/L (ref 101–111)
CO2: 22 mmol/L (ref 22–32)
CREATININE: 1.22 mg/dL (ref 0.61–1.24)
GFR calc Af Amer: 57 mL/min — ABNORMAL LOW (ref >60)
GFR calc non Af Amer: 49 mL/min — ABNORMAL LOW (ref >60)
GLUCOSE: 116 mg/dL — AB (ref 65–99)
Potassium: 4 mmol/L (ref 3.5–5.1)
SODIUM: 140 mmol/L (ref 135–145)
Total Bilirubin: 0.6 mg/dL (ref 0.3–1.2)
Total Protein: 6.4 g/dL — ABNORMAL LOW (ref 6.5–8.1)

## 2016-08-03 LAB — GLUCOSE, CAPILLARY
GLUCOSE-CAPILLARY: 117 mg/dL — AB (ref 65–99)
GLUCOSE-CAPILLARY: 169 mg/dL — AB (ref 65–99)
Glucose-Capillary: 95 mg/dL (ref 65–99)
Glucose-Capillary: 163 mg/dL — ABNORMAL HIGH (ref 65–99)

## 2016-08-03 LAB — CBC
HCT: 37.3 % — ABNORMAL LOW (ref 39.0–52.0)
Hemoglobin: 12.4 g/dL — ABNORMAL LOW (ref 13.0–17.0)
MCH: 31.6 pg (ref 26.0–34.0)
MCHC: 33.2 g/dL (ref 30.0–36.0)
MCV: 95.2 fL (ref 78.0–100.0)
PLATELETS: 189 10*3/uL (ref 150–400)
RBC: 3.92 MIL/uL — AB (ref 4.22–5.81)
RDW: 13.2 % (ref 11.5–15.5)
WBC: 11 10*3/uL — ABNORMAL HIGH (ref 4.0–10.5)

## 2016-08-03 MED ORDER — ATORVASTATIN CALCIUM 20 MG PO TABS: 20.0000 mg | ORAL_TABLET | Freq: Every day | ORAL | 1 refills | Status: DC

## 2016-08-03 MED ORDER — ASPIRIN 325 MG PO TABS: 325.0000 mg | ORAL_TABLET | Freq: Every day | ORAL | 1 refills | Status: DC

## 2016-08-03 MED ORDER — CLOPIDOGREL BISULFATE 75 MG PO TABS: 75.0000 mg | ORAL_TABLET | Freq: Every day | ORAL | 1 refills | Status: DC

## 2016-08-03 MED ORDER — POLYETHYLENE GLYCOL 3350 17 G PO PACK: 17.0000 g | PACK | Freq: Every day | ORAL | 0 refills | Status: AC | PRN

## 2016-08-03 MED ORDER — CEPHALEXIN 500 MG PO CAPS: 500.0000 mg | ORAL_CAPSULE | Freq: Two times a day (BID) | ORAL | 0 refills | Status: DC

## 2016-08-03 MED ORDER — SODIUM CHLORIDE 0.9 % IV SOLN
INTRAVENOUS | Status: DC
  Administered 2016-08-03: 50 mL/h via INTRAVENOUS
  Administered 2016-08-04: 11:00:00 via INTRAVENOUS

## 2016-08-03 NOTE — Discharge Summary (Addendum)
Physician Discharge Summary  GUSTAVUS HASKIN MRN: [id number] DOB/AGE: [birth year] 83 y.o.  PCP: Precious Reel, MD   Admit date: 07/31/2016 Discharge date: 08/03/2016  Discharge Diagnoses:    Principal Problem:   Stroke [facility]) Active Problems:   Essential hypertension, benign   Diabetes mellitus without complication (HCC)   paroxysmal afib   CKD (chronic kidney disease), stage III   Aphasia   Aortic atherosclerosis (HCC)    Addendum-discharge canceled as family has not decided on which nursing home they would like   Follow-up recommendations Follow-up with PCP in 3-5 days , including all  additional recommended appointments as below Follow-up CBC, CMP in 3-5 days Pt will follow up with Cecille Rubin NP at [facility] in about 6 weeks Aspiration precautions and fall precautions at all times   Speech therapy recommendations  Diet recommendations: Dysphagia 1 (puree);Honey-thick liquid;Thin liquid (thin water only) Liquids provided via: Cup;Teaspoon;Straw Medication Administration: Crushed with puree Supervision: Full supervision/cueing for compensatory strategies Compensations: Slow rate;Small sips/bites;Minimize environmental distractions;Multiple dry swallows after each bite/sip;Clear throat intermittently Postural Changes and/or Swallow Maneuvers: Seated upright 90 degrees;Upright 30-60 min after meal Patient needs continued Speech Lanaguage Pathology Services    Current Discharge Medication List    START taking these medications   Details  aspirin 325 MG tablet Take 1 tablet (325 mg total) by mouth daily. Qty: 30 tablet, Refills: 1    atorvastatin (LIPITOR) 20 MG tablet Take 1 tablet (20 mg total) by mouth daily at 6 PM. Qty: 30 tablet, Refills: 1    cephALEXin (KEFLEX) 500 MG capsule Take 1 capsule (500 mg total) by mouth 2 (two) times daily. Qty: 10 capsule, Refills: 0    clopidogrel (PLAVIX) 75 MG tablet Take 1 tablet (75 mg total) by mouth daily. Qty: 30 tablet,  Refills: 1    polyethylene glycol (MIRALAX / GLYCOLAX) packet Take 17 g by mouth daily as needed for mild constipation. Qty: 14 each, Refills: 0      CONTINUE these medications which have NOT CHANGED   Details  alendronate (FOSAMAX) 70 MG tablet Take 70 mg by mouth once a week. Take with a full glass of water on an empty stomach. ( Sunday )    amLODipine (NORVASC) 2.5 MG tablet Take 2.5 mg by mouth at bedtime.    metFORMIN (GLUCOPHAGE-XR) 500 MG 24 hr tablet Take 500 mg by mouth daily with breakfast.      STOP taking these medications     traMADol (ULTRAM) 50 MG tablet          Discharge Condition: Stable Discharge Instructions Get Medicines reviewed and adjusted: Please take all your medications with you for your next visit with your Primary MD  Please request your Primary MD to go over all hospital tests and procedure/radiological results at the follow up, please ask your Primary MD to get all Hospital records sent to his/her office.  If you experience worsening of your admission symptoms, develop shortness of breath, life threatening emergency, suicidal or homicidal thoughts you must seek medical attention immediately by calling 911 or calling your MD immediately if symptoms less severe.  You must read complete instructions/literature along with all the possible adverse reactions/side effects for all the Medicines you take and that have been prescribed to you. Take any new Medicines after you have completely understood and accpet all the possible adverse reactions/side effects.   Do not drive when taking Pain medications.   Do not take more than prescribed Pain, Sleep and Anxiety Medications  Special Instructions: If you have smoked or chewed Tobacco in the last 2 yrs please stop smoking, stop any regular Alcohol and or any Recreational drug use.  Wear Seat belts while driving.  Please note  You were cared for by a hospitalist during your hospital stay. Once you are  discharged, your primary care physician will handle any further medical issues. Please note that NO REFILLS for any discharge medications will be authorized once you are discharged, as it is imperative that you return to your primary care physician (or establish a relationship with a primary care physician if you do not have one) for your aftercare needs so that they can reassess your need for medications and monitor your lab values.  Discharge Instructions    Ambulatory referral to Neurology    Complete by:  As directed    Follow up with NP Cecille Rubin at [facility] in about 2 months. Thanks.       Allergies  Allergen Reactions  . Sulfa Antibiotics Itching      Disposition: SNF   Consults:  Neurology    Significant Diagnostic Studies:  Ct Angio Head W Or Wo Contrast  Result Date: 07/31/2016 CLINICAL DATA:  Aphasia.  Right arm and leg drift. EXAM: CT ANGIOGRAPHY HEAD AND NECK TECHNIQUE: Multidetector CT imaging of the head and neck was performed using the standard protocol during bolus administration of intravenous contrast. Multiplanar CT image reconstructions and MIPs were obtained to evaluate the vascular anatomy. Carotid stenosis measurements (when applicable) are obtained utilizing NASCET criteria, using the distal internal carotid diameter as the denominator. CONTRAST:  50 mL Isovue 370 COMPARISON:  Head CT 07/31/2016 and MRI 02/07/2015. No prior angiographic imaging. FINDINGS: CTA NECK FINDINGS Aortic arch: Standard 3 vessel aortic arch with heavy calcified and irregular soft plaque projecting into the lumen. Non-stenotic plaque in the brachiocephalic and right subclavian arteries. Mild narrowing of the proximal left subclavian artery due to soft and calcified plaque. Right carotid system: Moderate soft and calcified plaque in the mid to distal the common carotid artery without significant stenosis. Extensive, heavily calcified plaque at the bifurcation and carotid bulb results in  approximately 50% proximal ICA stenosis. Left carotid system: Mild-to-moderate calcified and soft plaque in the mid to distal common carotid artery without significant stenosis. Eccentric, calcified plaque in the proximal ICA results in 30% stenosis. There is moderate proximal ECA stenosis due to soft plaque. Vertebral arteries: Patent with the left being slightly larger than the right. No evidence of dissection or significant stenosis. Slight irregularity of the V3 segments bilaterally for which mild fibromuscular dysplasia is not excluded. Skeleton: Multilevel cervical disc and facet degeneration. Interbody and posterior element ankylosis at C3-4, C4-5, and C6-7. Other neck: No mass or lymph node enlargement. Upper chest: Partially visualized mosaic attenuation in the left upper lobe, possibly air trapping. Review of the MIP images confirms the above findings CTA HEAD FINDINGS Anterior circulation: The internal carotid arteries are patent from skullbase to carotid termini with diffuse siphon atherosclerosis bilaterally. There is mild cavernous and supraclinoid segment stenosis on the left. The MCAs are patent without evidence of major branch occlusion or significant proximal stenosis. There is an early MCA bifurcation on the left. There is mild asymmetric attenuation of distal left MCA branch vessels. The ACAs are patent without evidence of major branch occlusion or significant proximal stenosis. The left A1 segment is hypoplastic. No aneurysm. Posterior circulation: The intracranial vertebral arteries are patent with mild non stenotic calcified plaque in the left  V4 segment. Patent PICA, AICA, and SCA origins are visualized bilaterally. The basilar artery is patent without stenosis. There is a prominent right posterior communicating artery with markedly hypoplastic or absent/ occluded right P1 segment. No significant PCA stenosis is identified. No aneurysm. Venous sinuses: Not well evaluated due to contrast  timing. Anatomic variants: Fetal origin of the right PCA. Hypoplastic left A1. Delayed phase: Not performed. Review of the MIP images confirms the above findings IMPRESSION: 1. No emergent large vessel occlusion. 2. Advanced aortic atherosclerosis. 3. 50% proximal right ICA stenosis. 4. 30% proximal left ICA stenosis. 5. Mild intracranial left ICA stenosis. Preliminary findings of no large vessel occlusion were called by telephone at the time of interpretation on 07/31/2016 at 10:45 am to Dr. Leonel Ramsay, who verbally acknowledged these results. Electronically Signed   By: Logan Bores M.D.   On: 07/31/2016 11:09   Ct Angio Neck W Or Wo Contrast  Result Date: 07/31/2016 CLINICAL DATA:  Aphasia.  Right arm and leg drift. EXAM: CT ANGIOGRAPHY HEAD AND NECK TECHNIQUE: Multidetector CT imaging of the head and neck was performed using the standard protocol during bolus administration of intravenous contrast. Multiplanar CT image reconstructions and MIPs were obtained to evaluate the vascular anatomy. Carotid stenosis measurements (when applicable) are obtained utilizing NASCET criteria, using the distal internal carotid diameter as the denominator. CONTRAST:  50 mL Isovue 370 COMPARISON:  Head CT 07/31/2016 and MRI 02/07/2015. No prior angiographic imaging. FINDINGS: CTA NECK FINDINGS Aortic arch: Standard 3 vessel aortic arch with heavy calcified and irregular soft plaque projecting into the lumen. Non-stenotic plaque in the brachiocephalic and right subclavian arteries. Mild narrowing of the proximal left subclavian artery due to soft and calcified plaque. Right carotid system: Moderate soft and calcified plaque in the mid to distal the common carotid artery without significant stenosis. Extensive, heavily calcified plaque at the bifurcation and carotid bulb results in approximately 50% proximal ICA stenosis. Left carotid system: Mild-to-moderate calcified and soft plaque in the mid to distal common carotid artery  without significant stenosis. Eccentric, calcified plaque in the proximal ICA results in 30% stenosis. There is moderate proximal ECA stenosis due to soft plaque. Vertebral arteries: Patent with the left being slightly larger than the right. No evidence of dissection or significant stenosis. Slight irregularity of the V3 segments bilaterally for which mild fibromuscular dysplasia is not excluded. Skeleton: Multilevel cervical disc and facet degeneration. Interbody and posterior element ankylosis at C3-4, C4-5, and C6-7. Other neck: No mass or lymph node enlargement. Upper chest: Partially visualized mosaic attenuation in the left upper lobe, possibly air trapping. Review of the MIP images confirms the above findings CTA HEAD FINDINGS Anterior circulation: The internal carotid arteries are patent from skullbase to carotid termini with diffuse siphon atherosclerosis bilaterally. There is mild cavernous and supraclinoid segment stenosis on the left. The MCAs are patent without evidence of major branch occlusion or significant proximal stenosis. There is an early MCA bifurcation on the left. There is mild asymmetric attenuation of distal left MCA branch vessels. The ACAs are patent without evidence of major branch occlusion or significant proximal stenosis. The left A1 segment is hypoplastic. No aneurysm. Posterior circulation: The intracranial vertebral arteries are patent with mild non stenotic calcified plaque in the left V4 segment. Patent PICA, AICA, and SCA origins are visualized bilaterally. The basilar artery is patent without stenosis. There is a prominent right posterior communicating artery with markedly hypoplastic or absent/ occluded right P1 segment. No significant PCA stenosis is identified.  No aneurysm. Venous sinuses: Not well evaluated due to contrast timing. Anatomic variants: Fetal origin of the right PCA. Hypoplastic left A1. Delayed phase: Not performed. Review of the MIP images confirms the above  findings IMPRESSION: 1. No emergent large vessel occlusion. 2. Advanced aortic atherosclerosis. 3. 50% proximal right ICA stenosis. 4. 30% proximal left ICA stenosis. 5. Mild intracranial left ICA stenosis. Preliminary findings of no large vessel occlusion were called by telephone at the time of interpretation on 07/31/2016 at 10:45 am to Dr. Leonel Ramsay, who verbally acknowledged these results. Electronically Signed   By: Logan Bores M.D.   On: 07/31/2016 11:09   Mr Brain Wo Contrast  Result Date: 07/31/2016 CLINICAL DATA:  Slurred speech and RIGHT-sided weakness, last seen normal last night. History of diabetes, hypertension. EXAM: MRI HEAD WITHOUT CONTRAST TECHNIQUE: Multiplanar, multiecho pulse sequences of the brain and surrounding structures were obtained without intravenous contrast. COMPARISON:  CTA HEAD and neck July 31, 2016 at 1034 hours. FINDINGS: BRAIN: Faint patchy reduced diffusion LEFT frontal lobe including the operculum. Corresponding low ADC values with faint susceptibility artifact LEFT frontal cortex. Moderate to severe ventriculomegaly on the basis of global parenchymal brain volume loss. Confluent supratentorial white matter FLAIR T2 hyperintensities. No midline shift, mass effect or masses. Old RIGHT caudate head lacunar infarct. No abnormal extra-axial fluid collections. VASCULAR: Major intracranial vascular flow voids present at skull base, dolichoectasia associated with chronic hypertension. SKULL AND UPPER CERVICAL SPINE: No abnormal sellar expansion. No suspicious calvarial bone marrow signal. Craniocervical junction maintained. SINUSES/ORBITS: The mastoid air-cells and included paranasal sinuses are well-aerated. The included ocular globes and orbital contents are non-suspicious. Status post bilateral ocular lens implants. OTHER: None. IMPRESSION: Acute multifocal small LEFT frontal lobe infarcts (MCA territory) with faint petechial hemorrhage. Chronic changes include old RIGHT  caudate head lacunar infarct and moderate chronic small vessel ischemic disease. Electronically Signed   By: Elon Alas M.D.   On: 07/31/2016 19:57   Ct Head Code Stroke Wo Contrast`  Result Date: 07/31/2016 CLINICAL DATA:  Code stroke.  Aphasia.  Right arm and leg drift. EXAM: CT HEAD WITHOUT CONTRAST TECHNIQUE: Contiguous axial images were obtained from the base of the skull through the vertex without intravenous contrast. COMPARISON:  Brain MRI 02/07/2015 FINDINGS: Brain: There is no evidence of acute intracranial hemorrhage, mass, midline shift, or extra-axial fluid collection. There is age related cerebral atrophy. Patchy to confluent bilateral cerebral white matter hypodensities are nonspecific but compatible with moderate chronic small vessel ischemic disease. There is mildly asymmetric white matter hypoattenuation at the level of the posterior left corona radiata (series 201, image 21) with possible early loss of gray- white differentiation involving the overlying cortex. There is a chronic lacunar infarct in the right caudate nucleus. Vascular: Calcified atherosclerosis at the skullbase. No proximal dense MCA. Slightly increased density of a small MCA branch vessel posteriorly in the left sylvian fissure, which may represent a distal M2/ proximal M3 embolus versus atherosclerosis. Skull: No fracture or focal osseous lesion. Sinuses/Orbits: Bilateral cataract extraction. No significant sinus disease. Other: None. ASPECTS [facility] Stroke Program Early CT Score) - Ganglionic level infarction (caudate, lentiform nuclei, internal capsule, insula, M1-M3 cortex): 7 - Supraganglionic infarction (M4-M6 cortex): 2 Total score (0-10 with 10 being normal): 9 IMPRESSION: 1. No acute intracranial hemorrhage. 2. Possible early left parietal lobe infarct. 3. ASPECTS is 9. 4. Moderate chronic small vessel ischemic disease. These results were called by telephone at the time of interpretation on 07/31/2016 at 10:30  am  to Dr. Leonel Ramsay, who verbally acknowledged these results. Electronically Signed   By: Logan Bores M.D.   On: 07/31/2016 10:39    echocardiogram  LV EF: 55% -   60%  ------------------------------------------------------------------- History:   PMH:   Atrial fibrillation.  Stroke.  Risk factors: Hypertension. Diabetes mellitus.  ------------------------------------------------------------------- Study Conclusions  - Left ventricle: The cavity size was normal. Wall thickness was   increased in a pattern of moderate LVH. There was focal basal   hypertrophy. Systolic function was normal. The estimated ejection   fraction was in the range of 55% to 60%. Wall motion was normal;   there were no regional wall motion abnormalities. Doppler   parameters are consistent with abnormal left ventricular   relaxation (grade 1 diastolic dysfunction). - Aortic valve: There was very mild stenosis. Valve area (VTI):   1.69 cm^2. Valve area (Vmax): 1.61 cm^2. Valve area (Vmean): 1.63   cm^2. - Mitral valve: Mildly calcified annulus. Mildly thickened, mildly   calcified leaflets . There was moderate regurgitation. - Right atrium: The atrium was mildly dilated.     Filed Weights   07/31/16 0936 07/31/16 1000  Weight: 65.8 kg (145 lb) 63.9 kg (140 lb 14 oz)     Microbiology: No results found for this or any previous visit (from the past 240 hour(s)).     Blood Culture    Component Value Date/Time   SDES URINE, CLEAN CATCH 05/25/2016 1606   SPECREQUEST NONE 05/25/2016 1606   CULT MULTIPLE SPECIES PRESENT, SUGGEST RECOLLECTION (A) 05/25/2016 1606   REPTSTATUS 05/27/2016 FINAL 05/25/2016 1606      Labs: Results for orders placed or performed during the hospital encounter of 07/31/16 (from the past 48 hour(s))  Glucose, capillary     Status: Abnormal   Collection Time: 08/01/16 11:09 AM  Result Value Ref Range   Glucose-Capillary 128 (H) 65 - 99 mg/dL   Comment 1 Notify RN     Comment 2 Document in Chart   Glucose, capillary     Status: Abnormal   Collection Time: 08/01/16  4:55 PM  Result Value Ref Range   Glucose-Capillary 126 (H) 65 - 99 mg/dL  Glucose, capillary     Status: Abnormal   Collection Time: 08/01/16  7:32 PM  Result Value Ref Range   Glucose-Capillary 141 (H) 65 - 99 mg/dL   Comment 1 Notify RN    Comment 2 Document in Chart   Glucose, capillary     Status: Abnormal   Collection Time: 08/02/16 12:07 AM  Result Value Ref Range   Glucose-Capillary 212 (H) 65 - 99 mg/dL   Comment 1 Notify RN    Comment 2 Document in Chart   Glucose, capillary     Status: Abnormal   Collection Time: 08/02/16  6:20 AM  Result Value Ref Range   Glucose-Capillary 123 (H) 65 - 99 mg/dL   Comment 1 Notify RN    Comment 2 Document in Chart   Glucose, capillary     Status: Abnormal   Collection Time: 08/02/16 11:58 AM  Result Value Ref Range   Glucose-Capillary 125 (H) 65 - 99 mg/dL   Comment 1 Notify RN    Comment 2 Document in Chart   Glucose, capillary     Status: Abnormal   Collection Time: 08/02/16  4:22 PM  Result Value Ref Range   Glucose-Capillary 125 (H) 65 - 99 mg/dL   Comment 1 Notify RN    Comment 2 Document in Chart  Glucose, capillary     Status: Abnormal   Collection Time: 08/02/16  9:47 PM  Result Value Ref Range   Glucose-Capillary 109 (H) 65 - 99 mg/dL   Comment 1 Notify RN    Comment 2 Document in Chart   Comprehensive metabolic panel     Status: Abnormal   Collection Time: 08/03/16  4:11 AM  Result Value Ref Range   Sodium 140 135 - 145 mmol/L   Potassium 4.0 3.5 - 5.1 mmol/L   Chloride 107 101 - 111 mmol/L   CO2 22 22 - 32 mmol/L   Glucose, Bld 116 (H) 65 - 99 mg/dL   BUN 19 6 - 20 mg/dL   Creatinine, Ser 1.22 0.61 - 1.24 mg/dL   Calcium 8.6 (L) 8.9 - 10.3 mg/dL   Total Protein 6.4 (L) 6.5 - 8.1 g/dL   Albumin 3.3 (L) 3.5 - 5.0 g/dL   AST 19 15 - 41 U/L   ALT 12 (L) 17 - 63 U/L   Alkaline Phosphatase 67 38 - 126 U/L   Total  Bilirubin 0.6 0.3 - 1.2 mg/dL   GFR calc non Af Amer 49 (L) >60 mL/min   GFR calc Af Amer 57 (L) >60 mL/min    Comment: (NOTE) The eGFR has been calculated using the CKD EPI equation. This calculation has not been validated in all clinical situations. eGFR's persistently <60 mL/min signify possible Chronic Kidney Disease.    Anion gap 11 5 - 15  CBC     Status: Abnormal   Collection Time: 08/03/16  4:11 AM  Result Value Ref Range   WBC 11.0 (H) 4.0 - 10.5 K/uL   RBC 3.92 (L) 4.22 - 5.81 MIL/uL   Hemoglobin 12.4 (L) 13.0 - 17.0 g/dL   HCT 37.3 (L) 39.0 - 52.0 %   MCV 95.2 78.0 - 100.0 fL   MCH 31.6 26.0 - 34.0 pg   MCHC 33.2 30.0 - 36.0 g/dL   RDW 13.2 11.5 - 15.5 %   Platelets 189 150 - 400 K/uL  Glucose, capillary     Status: Abnormal   Collection Time: 08/03/16  6:17 AM  Result Value Ref Range   Glucose-Capillary 117 (H) 65 - 99 mg/dL   Comment 1 Notify RN    Comment 2 Document in Chart      Lipid Panel     Component Value Date/Time   CHOL 158 08/01/2016 0526   TRIG 134 08/01/2016 0526   HDL 41 08/01/2016 0526   CHOLHDL 3.9 08/01/2016 0526   VLDL 27 08/01/2016 0526   LDLCALC 90 08/01/2016 0526     Lab Results  Component Value Date   HGBA1C 7.2 (H) 08/01/2016   HGBA1C 6.4 (H) 06/23/2015   HGBA1C 7.3 (H) 02/07/2015       HPI :  Clayton [name] is a 83 y.o. male with history of T2DM, CKD,  PAF, BPH, syncopal episodes, visual deficits; who was admitted on 07/31/16 with difficulty speaking. MRI brain done revealing acute multifocal left frontal lobe infarcts with faint petechial hemorrhage. CTA head/neck done revealing advanced aortic atherosclerosis, 50% proximal R-ICA and 30% proximal L-ICA stenosis. 2 D echo done today. Dr. Leonel Ramsay recommends ASA for secondary stroke prevention.  MBS with severe dysphagia due to significant oral and pharyngeal sensorimotor weakness and NPO recommended. PT evaluation revealed flexed posture with decreased balance and requires  assistance with mobility. CIR recommended for follow up therapy.  Patient ambulated with walker at home. He lives in  an assisted living facility with his wife.  HOSPITAL COURSE:   Stroke:  Small L MCA territory infarcts embolic likely secondary to large vessel atherosclerosis. However, pt did have remote transient afib in the setting of acute pancreatitis, therefore recommend cardiac monitoring to rule out afib.  Resultant  Severe dysarthria and partial expressive aphasia  Code stroke CT no acute hmg. Possible early L parietal infarct. small vessel disease.  Aspects 9.   CTA head & neck no LVO. Severe aortic atheroma. R ICA 50%, L ICA 30%. Mild IC L ICA stenosis.  MRI  Acute small L frontal lobe (MCA) infarcts with petechial hemorrhage. Old R caudate lacune.   Neurology Recommend 30 day cardiac event monitoring as outpt to rule out afib, requested [facility] to arrange 3/30 at 10:30 am   2D Echo  EF 55-60%  LDL 90  HgbA1c 7.2  Heparin 5000 units sq tid for VTE prophylaxis  DIET - DYS 1 Room service appropriate?   Honey Thick  No antithrombotic prior to admission, now on ASA '325mg'$  and plavix '75mg'$  for 3 months and then plavix alone Recommend 30 day cardiac event monitoring as outpt to rule out afib ASA '325mg'$  and plavix '75mg'$  for 3 months and then plavix alone.    Hypertension Continue home meds    Paroxysmal AFib -not on any anticoagulation therapy Patient will be placed on regular dose aspirin and plavix per neuro Neurology does not recommend anticoagulation at this time  DM type II - most recent HgbA1C is 6.4% on 06/22/16  Continue metformin and renal function remained stable Consider discontinuation of metformin if creatinine greater than 1.4  UTI Treated with Rocephin, now switched to Keflex  CK D stage III-baseline around 1.4-1.5, creatinine stable at this time, improved now down to 1.2     Discharge Exam:  Blood pressure (!) 165/80, pulse 65, temperature 98.3  F (36.8 C), temperature source Axillary, resp. rate 18, height '5\' 11"'$  (1.803 m), weight 63.9 kg (140 lb 14 oz), SpO2 95 %.  General exam: Appears calm and comfortable  Respiratory system: Clear to auscultation. Respiratory effort normal. Cardiovascular system: S1 & S2 heard, RRR. No JVD, murmurs, rubs, gallops or clicks. No pedal edema. Gastrointestinal system: Abdomen is nondistended, soft and nontender. No organomegaly or masses felt. Normal bowel sounds heard. Central nervous system: Awake,expressive dysarthria Extremities: Symmetric 5 x 5 power. Skin: No rashes, lesions or ulcers Psychiatry: Judgement impaired.      Follow-up Information    Dennie Bible, NP. Schedule an appointment as soon as possible for a visit in 6 week(s).   Specialty:  Family Medicine Contact information: [address] [place] [place] [phone]        Precious Reel, MD. Call.   Specialty:  Internal Medicine Why:  Hospital follow-up in 3-5 days Contact information: [address] [place] [address] [phone]           Signed: Reyne Dumas 08/03/2016, 10:29 AM        Time spent >45 mins

## 2016-08-04 LAB — BASIC METABOLIC PANEL
ANION GAP: 7 (ref 5–15)
BUN: 19 mg/dL (ref 6–20)
CHLORIDE: 110 mmol/L (ref 101–111)
CO2: 22 mmol/L (ref 22–32)
Calcium: 8.5 mg/dL — ABNORMAL LOW (ref 8.9–10.3)
Creatinine, Ser: 1.29 mg/dL — ABNORMAL HIGH (ref 0.61–1.24)
GFR calc Af Amer: 53 mL/min — ABNORMAL LOW (ref >60)
GFR, EST NON AFRICAN AMERICAN: 46 mL/min — AB (ref >60)
Glucose, Bld: 211 mg/dL — ABNORMAL HIGH (ref 65–99)
POTASSIUM: 4 mmol/L (ref 3.5–5.1)
SODIUM: 139 mmol/L (ref 135–145)

## 2016-08-04 LAB — GLUCOSE, CAPILLARY
GLUCOSE-CAPILLARY: 182 mg/dL — AB (ref 65–99)
GLUCOSE-CAPILLARY: 98 mg/dL (ref 65–99)
Glucose-Capillary: 108 mg/dL — ABNORMAL HIGH (ref 65–99)

## 2016-08-04 MED ORDER — ENSURE ENLIVE PO LIQD
237.0000 mL | Freq: Two times a day (BID) | ORAL | Status: DC
  Administered 2016-08-04 – 2016-08-05 (×3): 237 mL via ORAL

## 2016-08-04 NOTE — Progress Notes (Signed)
[facility] PROGRESS NOTE  OLON RUSS JXB:[id number] DOB: May 21, 1919 DOA: 07/31/2016   PCP: Gwen Pounds, MD     Assessment/Plan: Principal Problem:   Stroke (HCC) Active Problems:   Essential hypertension, benign   Diabetes mellitus without complication (HCC)   paroxysmal afib   CKD (chronic kidney disease), stage III   Aphasia   Aortic atherosclerosis (HCC)   83 y.o. male with medical history significant of DM type II,  CKD, HTN, PAF, anemia who presented to the ED via EMS with c/o slurred speech and right sided weakness. Admitted for stroke workup. MRI positive for stroke  Assessment and plan Acute ischemic stroke Patient was seen by neuro team MRI Acute multifocal small LEFT frontal lobe infarcts (MCA territory) CT head and neck- No emergent large vessel occlusion Continue aspirin, stroke team following and will reassess need for antiplatelet rx  Hemoglobin A1c 7.2 LDL 90, HDL 41, triglycerides 134 PT/OT-CIR/speech therapy  Dysphagia 1 (puree);Honey-thick liquid;Thin liquid (thin water only) 2-D echo EF 55-60% Patient has a history of paroxysmal  not on anticoagulation at home Recommend 30 day cardiac event monitoring as outpt to rule out afib, requested [facility] to arrange 3/30 at 10:30 am  ASA  and plavix  for 3 months and then plavix alone.  Pt will follow up with [facility] in about 6 weeks Aspiration precautions and fall precautions at all times  Patient needs continued Speech Lanaguage Pathology Services Patient will need 24 /7 care, encouragement for feeding/drinking, given his advanced age-calorie count prior to discharge to ensure that the patient is meeting his daily requirements Did offer NG tube placement for tube feeding, if family was very concerned about dehydration. They do not want NG tube at this point and would like to work with speech therapy to see how he can advance his diet.   Hypertension Continue home meds     Paroxysmal AFib -not on any anticoagulation therapy Patient will be placed on regular dose aspirin and plavix per neuro Neurology does not recommend anticoagulation at this time  DM type II - most recent HgbA1C is 6.4% on 06/22/16  Continue metformin and renal function remained stable Consider discontinuation of metformin if creatinine greater than 1.4  UTI Treated with Rocephin, now switched to Keflex  CK D stage III-baseline around 1.4-1.5, creatinine stable at this time, improved now down to 1.2   DVT prophylaxsis heparin  Code Status:  DNR     Family Communication: Called patient's daughter and wife, son and daughter-in-law by the bedside and discussed current findings with them Disposition Plan: SNF 4/2    Consultants:  Neurology  Procedures:  None  Antibiotics: Anti-infectives    Start     Dose/Rate Route Frequency Ordered Stop   08/03/16 0000  cephALEXin (KEFLEX) 500 MG capsule     500 mg Oral 2 times daily 08/03/16 1029 08/08/16 2359   07/31/16 1900  cefTRIAXone (ROCEPHIN) 1 g in dextrose 5 % 50 mL IVPB     1 g 100 mL/hr over 30 Minutes Intravenous Every 24 hours 07/31/16 1845           HPI/Subjective: Patient continues to have significant expressive aphasia/dysarthria. Poor oral intake per daughter who is concerned about him getting dehydrated   Objective: Vitals:   08/03/16 1445 08/03/16 2043 08/04/16 0057 08/04/16 0521  BP: (!) 160/66 (!) 169/83 127/87 (!) 174/83  Pulse: (!) 58 63 (!) 52 71  Resp: 20  Temp: 97.6 F (36.4 C) 97.6 F (36.4 C) 98.4 F (36.9 C) 98.7 F (37.1 C)  TempSrc: Oral Oral Axillary Axillary  SpO2: 100% 95% 100% 100%  Weight:      Height:        Intake/Output Summary (Last 24 hours) at 08/04/16 1002 Last data filed at 08/04/16 0400  Gross per 24 hour  Intake           874.17 ml  Output              600 ml  Net           274.17 ml    Exam:  Examination:  General exam: Appears calm and  comfortable  Respiratory system: Clear to auscultation. Respiratory effort normal. Cardiovascular system: S1 & S2 heard, RRR. No JVD, murmurs, rubs, gallops or clicks. No pedal edema. Gastrointestinal system: Abdomen is nondistended, soft and nontender. No organomegaly or masses felt. Normal bowel sounds heard. Central nervous system: Awake,expressive dysarthria Extremities: Symmetric 5 x 5 power. Skin: No rashes, lesions or ulcers Psychiatry: Judgement impaired.     Data Reviewed: I have personally reviewed following labs and imaging studies  Micro Results Recent Results (from the past 240 hour(s))  Culture, Urine     Status: Abnormal (Preliminary result)   Collection Time: 08/03/16  2:32 AM  Result Value Ref Range Status   Specimen Description URINE, RANDOM  Final   Special Requests NONE  Final   Culture >=100,000 COLONIES/mL UNIDENTIFIED ORGANISM (A)  Final   Report Status PENDING  Incomplete    Radiology Reports Ct Angio Head W Or Wo Contrast  Result Date: 07/31/2016 CLINICAL DATA:  Aphasia.  Right arm and leg drift. EXAM: CT ANGIOGRAPHY HEAD AND NECK TECHNIQUE: Multidetector CT imaging of the head and neck was performed using the standard protocol during bolus administration of intravenous contrast. Multiplanar CT image reconstructions and MIPs were obtained to evaluate the vascular anatomy. Carotid stenosis measurements (when applicable) are obtained utilizing NASCET criteria, using the distal internal carotid diameter as the denominator. CONTRAST:  50 mL Isovue 370 COMPARISON:  Head CT 07/31/2016 and MRI 02/07/2015. No prior angiographic imaging. FINDINGS: CTA NECK FINDINGS Aortic arch: Standard 3 vessel aortic arch with heavy calcified and irregular soft plaque projecting into the lumen. Non-stenotic plaque in the brachiocephalic and right subclavian arteries. Mild narrowing of the proximal left subclavian artery due to soft and calcified plaque. Right carotid system: Moderate soft  and calcified plaque in the mid to distal the common carotid artery without significant stenosis. Extensive, heavily calcified plaque at the bifurcation and carotid bulb results in approximately 50% proximal ICA stenosis. Left carotid system: Mild-to-moderate calcified and soft plaque in the mid to distal common carotid artery without significant stenosis. Eccentric, calcified plaque in the proximal ICA results in 30% stenosis. There is moderate proximal ECA stenosis due to soft plaque. Vertebral arteries: Patent with the left being slightly larger than the right. No evidence of dissection or significant stenosis. Slight irregularity of the V3 segments bilaterally for which mild fibromuscular dysplasia is not excluded. Skeleton: Multilevel cervical disc and facet degeneration. Interbody and posterior element ankylosis at C3-4, C4-5, and C6-7. Other neck: No mass or lymph node enlargement. Upper chest: Partially visualized mosaic attenuation in the left upper lobe, possibly air trapping. Review of the MIP images confirms the above findings CTA HEAD FINDINGS Anterior circulation: The internal carotid arteries are patent from skullbase to carotid termini with diffuse siphon atherosclerosis bilaterally. There is mild cavernous  and supraclinoid segment stenosis on the left. The MCAs are patent without evidence of major branch occlusion or significant proximal stenosis. There is an early MCA bifurcation on the left. There is mild asymmetric attenuation of distal left MCA branch vessels. The ACAs are patent without evidence of major branch occlusion or significant proximal stenosis. The left A1 segment is hypoplastic. No aneurysm. Posterior circulation: The intracranial vertebral arteries are patent with mild non stenotic calcified plaque in the left V4 segment. Patent PICA, AICA, and SCA origins are visualized bilaterally. The basilar artery is patent without stenosis. There is a prominent right posterior communicating  artery with markedly hypoplastic or absent/ occluded right P1 segment. No significant PCA stenosis is identified. No aneurysm. Venous sinuses: Not well evaluated due to contrast timing. Anatomic variants: Fetal origin of the right PCA. Hypoplastic left A1. Delayed phase: Not performed. Review of the MIP images confirms the above findings IMPRESSION: 1. No emergent large vessel occlusion. 2. Advanced aortic atherosclerosis. 3. 50% proximal right ICA stenosis. 4. 30% proximal left ICA stenosis. 5. Mild intracranial left ICA stenosis. Preliminary findings of no large vessel occlusion were called by telephone at the time of interpretation on 07/31/2016 at 10:45 am to Dr. Amada Jupiter, who verbally acknowledged these results. Electronically Signed   By: Sebastian Ache M.D.   On: 07/31/2016 11:09   Ct Angio Neck W Or Wo Contrast  Result Date: 07/31/2016 CLINICAL DATA:  Aphasia.  Right arm and leg drift. EXAM: CT ANGIOGRAPHY HEAD AND NECK TECHNIQUE: Multidetector CT imaging of the head and neck was performed using the standard protocol during bolus administration of intravenous contrast. Multiplanar CT image reconstructions and MIPs were obtained to evaluate the vascular anatomy. Carotid stenosis measurements (when applicable) are obtained utilizing NASCET criteria, using the distal internal carotid diameter as the denominator. CONTRAST:  50 mL Isovue 370 COMPARISON:  Head CT 07/31/2016 and MRI 02/07/2015. No prior angiographic imaging. FINDINGS: CTA NECK FINDINGS Aortic arch: Standard 3 vessel aortic arch with heavy calcified and irregular soft plaque projecting into the lumen. Non-stenotic plaque in the brachiocephalic and right subclavian arteries. Mild narrowing of the proximal left subclavian artery due to soft and calcified plaque. Right carotid system: Moderate soft and calcified plaque in the mid to distal the common carotid artery without significant stenosis. Extensive, heavily calcified plaque at the bifurcation  and carotid bulb results in approximately 50% proximal ICA stenosis. Left carotid system: Mild-to-moderate calcified and soft plaque in the mid to distal common carotid artery without significant stenosis. Eccentric, calcified plaque in the proximal ICA results in 30% stenosis. There is moderate proximal ECA stenosis due to soft plaque. Vertebral arteries: Patent with the left being slightly larger than the right. No evidence of dissection or significant stenosis. Slight irregularity of the V3 segments bilaterally for which mild fibromuscular dysplasia is not excluded. Skeleton: Multilevel cervical disc and facet degeneration. Interbody and posterior element ankylosis at C3-4, C4-5, and C6-7. Other neck: No mass or lymph node enlargement. Upper chest: Partially visualized mosaic attenuation in the left upper lobe, possibly air trapping. Review of the MIP images confirms the above findings CTA HEAD FINDINGS Anterior circulation: The internal carotid arteries are patent from skullbase to carotid termini with diffuse siphon atherosclerosis bilaterally. There is mild cavernous and supraclinoid segment stenosis on the left. The MCAs are patent without evidence of major branch occlusion or significant proximal stenosis. There is an early MCA bifurcation on the left. There is mild asymmetric attenuation of distal left MCA branch vessels.  The ACAs are patent without evidence of major branch occlusion or significant proximal stenosis. The left A1 segment is hypoplastic. No aneurysm. Posterior circulation: The intracranial vertebral arteries are patent with mild non stenotic calcified plaque in the left V4 segment. Patent PICA, AICA, and SCA origins are visualized bilaterally. The basilar artery is patent without stenosis. There is a prominent right posterior communicating artery with markedly hypoplastic or absent/ occluded right P1 segment. No significant PCA stenosis is identified. No aneurysm. Venous sinuses: Not well  evaluated due to contrast timing. Anatomic variants: Fetal origin of the right PCA. Hypoplastic left A1. Delayed phase: Not performed. Review of the MIP images confirms the above findings IMPRESSION: 1. No emergent large vessel occlusion. 2. Advanced aortic atherosclerosis. 3. 50% proximal right ICA stenosis. 4. 30% proximal left ICA stenosis. 5. Mild intracranial left ICA stenosis. Preliminary findings of no large vessel occlusion were called by telephone at the time of interpretation on 07/31/2016 at 10:45 am to Dr. Amada Jupiter, who verbally acknowledged these results. Electronically Signed   By: Sebastian Ache M.D.   On: 07/31/2016 11:09   Mr Brain Wo Contrast  Result Date: 07/31/2016 CLINICAL DATA:  Slurred speech and RIGHT-sided weakness, last seen normal last night. History of diabetes, hypertension. EXAM: MRI HEAD WITHOUT CONTRAST TECHNIQUE: Multiplanar, multiecho pulse sequences of the brain and surrounding structures were obtained without intravenous contrast. COMPARISON:  CTA HEAD and neck July 31, 2016 at 1034 hours. FINDINGS: BRAIN: Faint patchy reduced diffusion LEFT frontal lobe including the operculum. Corresponding low ADC values with faint susceptibility artifact LEFT frontal cortex. Moderate to severe ventriculomegaly on the basis of global parenchymal brain volume loss. Confluent supratentorial white matter FLAIR T2 hyperintensities. No midline shift, mass effect or masses. Old RIGHT caudate head lacunar infarct. No abnormal extra-axial fluid collections. VASCULAR: Major intracranial vascular flow voids present at skull base, dolichoectasia associated with chronic hypertension. SKULL AND UPPER CERVICAL SPINE: No abnormal sellar expansion. No suspicious calvarial bone marrow signal. Craniocervical junction maintained. SINUSES/ORBITS: The mastoid air-cells and included paranasal sinuses are well-aerated. The included ocular globes and orbital contents are non-suspicious. Status post bilateral  ocular lens implants. OTHER: None. IMPRESSION: Acute multifocal small LEFT frontal lobe infarcts (MCA territory) with faint petechial hemorrhage. Chronic changes include old RIGHT caudate head lacunar infarct and moderate chronic small vessel ischemic disease. Electronically Signed   By: Awilda Metro M.D.   On: 07/31/2016 19:57   Ct Head Code Stroke Wo Contrast`  Result Date: 07/31/2016 CLINICAL DATA:  Code stroke.  Aphasia.  Right arm and leg drift. EXAM: CT HEAD WITHOUT CONTRAST TECHNIQUE: Contiguous axial images were obtained from the base of the skull through the vertex without intravenous contrast. COMPARISON:  Brain MRI 02/07/2015 FINDINGS: Brain: There is no evidence of acute intracranial hemorrhage, mass, midline shift, or extra-axial fluid collection. There is age related cerebral atrophy. Patchy to confluent bilateral cerebral white matter hypodensities are nonspecific but compatible with moderate chronic small vessel ischemic disease. There is mildly asymmetric white matter hypoattenuation at the level of the posterior left corona radiata (series 201, image 21) with possible early loss of gray- white differentiation involving the overlying cortex. There is a chronic lacunar infarct in the right caudate nucleus. Vascular: Calcified atherosclerosis at the skullbase. No proximal dense MCA. Slightly increased density of a small MCA branch vessel posteriorly in the left sylvian fissure, which may represent a distal M2/ proximal M3 embolus versus atherosclerosis. Skull: No fracture or focal osseous lesion. Sinuses/Orbits: Bilateral cataract extraction. No  significant sinus disease. Other: None. ASPECTS [facility] Stroke Program Early CT Score) - Ganglionic level infarction (caudate, lentiform nuclei, internal capsule, insula, M1-M3 cortex): 7 - Supraganglionic infarction (M4-M6 cortex): 2 Total score (0-10 with 10 being normal): 9 IMPRESSION: 1. No acute intracranial hemorrhage. 2. Possible early left  parietal lobe infarct. 3. ASPECTS is 9. 4. Moderate chronic small vessel ischemic disease. These results were called by telephone at the time of interpretation on 07/31/2016 at 10:30 am to Dr. Amada Jupiter, who verbally acknowledged these results. Electronically Signed   By: Sebastian Ache M.D.   On: 07/31/2016 10:39     CBC  Recent Labs Lab 07/31/16 0952 07/31/16 1010 08/01/16 0526 08/03/16 0411  WBC 9.2  --  9.6 11.0*  HGB 13.3 13.6 12.5* 12.4*  HCT 40.5 40.0 38.2* 37.3*  PLT 211  --  219 189  MCV 96.2  --  96.5 95.2  MCH 31.6  --  31.6 31.6  MCHC 32.8  --  32.7 33.2  RDW 13.2  --  13.1 13.2  LYMPHSABS 1.3  --   --   --   MONOABS 0.7  --   --   --   EOSABS 0.5  --   --   --   BASOSABS 0.1  --   --   --     Chemistries   Recent Labs Lab 07/31/16 0952 07/31/16 1010 08/01/16 0526 08/03/16 0411  NA 138 139 140 140  K 4.5 4.5 4.1 4.0  CL 102 103 106 107  CO2 25  --  24 22  GLUCOSE 155* 154* 122* 116*  BUN 28* 32* 24* 19  CREATININE 1.41* 1.40* 1.30* 1.22  CALCIUM 9.6  --  9.0 8.6*  AST 18  --   --  19  ALT 11*  --   --  12*  ALKPHOS 78  --   --  67  BILITOT 0.7  --   --  0.6   ------------------------------------------------------------------------------------------------------------------ estimated creatinine clearance is 33.5 mL/min (by C-G formula based on SCr of 1.22 mg/dL). ------------------------------------------------------------------------------------------------------------------ No results for input(s): HGBA1C in the last 72 hours. ------------------------------------------------------------------------------------------------------------------ No results for input(s): CHOL, HDL, LDLCALC, TRIG, CHOLHDL, LDLDIRECT in the last 72 hours. ------------------------------------------------------------------------------------------------------------------ No results for input(s): TSH, T4TOTAL, T3FREE, THYROIDAB in the last 72 hours.  Invalid input(s):  FREET3 ------------------------------------------------------------------------------------------------------------------ No results for input(s): VITAMINB12, FOLATE, FERRITIN, TIBC, IRON, RETICCTPCT in the last 72 hours.  Coagulation profile  Recent Labs Lab 07/31/16 0952  INR 1.02    No results for input(s): DDIMER in the last 72 hours.  Cardiac Enzymes No results for input(s): CKMB, TROPONINI, MYOGLOBIN in the last 168 hours.  Invalid input(s): CK ------------------------------------------------------------------------------------------------------------------ Invalid input(s): POCBNP   CBG:  Recent Labs Lab 08/03/16 0617 08/03/16 1149 08/03/16 1643 08/03/16 2114 08/04/16 0619  GLUCAP 117* 169* 95 163* 108*       Studies: No results found.    Lab Results  Component Value Date   HGBA1C 7.2 (H) 08/01/2016   HGBA1C 6.4 (H) 06/23/2015   HGBA1C 7.3 (H) 02/07/2015   Lab Results  Component Value Date   LDLCALC 90 08/01/2016   CREATININE 1.22 08/03/2016       Scheduled Meds: .  stroke: mapping our early stages of recovery book   Does not apply Once  . aspirin  325 mg Oral Daily  . atorvastatin  20 mg Oral q1800  . cefTRIAXone (ROCEPHIN)  IV  1 g Intravenous Q24H  . clopidogrel  75 mg  Oral Daily  . heparin  5,000 Units Subcutaneous Q8H  . insulin aspart  0-9 Units Subcutaneous TID WC   Continuous Infusions: . sodium chloride 50 mL/hr (08/03/16 1704)     LOS: 4 days    Time spent: >30 MINS    [facility]  [facility] Pager [phone]. If 7PM-7AM, please contact night-coverage at [email or web address], password [facility] 08/04/2016, 10:02 AM  LOS: 4 days

## 2016-08-05 DIAGNOSIS — E1159 Type 2 diabetes mellitus with other circulatory complications: Secondary | ICD-10-CM | POA: Diagnosis not present

## 2016-08-05 DIAGNOSIS — R54 Age-related physical debility: Secondary | ICD-10-CM | POA: Diagnosis not present

## 2016-08-05 DIAGNOSIS — M810 Age-related osteoporosis without current pathological fracture: Secondary | ICD-10-CM | POA: Diagnosis not present

## 2016-08-05 DIAGNOSIS — H3530 Unspecified macular degeneration: Secondary | ICD-10-CM | POA: Diagnosis not present

## 2016-08-05 DIAGNOSIS — R4701 Aphasia: Secondary | ICD-10-CM | POA: Diagnosis not present

## 2016-08-05 DIAGNOSIS — E119 Type 2 diabetes mellitus without complications: Secondary | ICD-10-CM | POA: Diagnosis not present

## 2016-08-05 DIAGNOSIS — I700 Atherosclerosis of aorta: Secondary | ICD-10-CM | POA: Diagnosis not present

## 2016-08-05 DIAGNOSIS — I4891 Unspecified atrial fibrillation: Secondary | ICD-10-CM | POA: Diagnosis not present

## 2016-08-05 DIAGNOSIS — I639 Cerebral infarction, unspecified: Secondary | ICD-10-CM | POA: Diagnosis not present

## 2016-08-05 DIAGNOSIS — J189 Pneumonia, unspecified organism: Secondary | ICD-10-CM | POA: Diagnosis not present

## 2016-08-05 DIAGNOSIS — N401 Enlarged prostate with lower urinary tract symptoms: Secondary | ICD-10-CM | POA: Diagnosis not present

## 2016-08-05 DIAGNOSIS — N183 Chronic kidney disease, stage 3 (moderate): Secondary | ICD-10-CM | POA: Diagnosis not present

## 2016-08-05 DIAGNOSIS — R1310 Dysphagia, unspecified: Secondary | ICD-10-CM | POA: Diagnosis not present

## 2016-08-05 DIAGNOSIS — I482 Chronic atrial fibrillation: Secondary | ICD-10-CM | POA: Diagnosis not present

## 2016-08-05 DIAGNOSIS — I679 Cerebrovascular disease, unspecified: Secondary | ICD-10-CM | POA: Diagnosis not present

## 2016-08-05 DIAGNOSIS — I10 Essential (primary) hypertension: Secondary | ICD-10-CM | POA: Diagnosis not present

## 2016-08-05 DIAGNOSIS — I63312 Cerebral infarction due to thrombosis of left middle cerebral artery: Secondary | ICD-10-CM | POA: Diagnosis not present

## 2016-08-05 DIAGNOSIS — I480 Paroxysmal atrial fibrillation: Secondary | ICD-10-CM | POA: Diagnosis not present

## 2016-08-05 DIAGNOSIS — E785 Hyperlipidemia, unspecified: Secondary | ICD-10-CM | POA: Diagnosis not present

## 2016-08-05 LAB — CBC
HEMATOCRIT: 37.3 % — AB (ref 39.0–52.0)
Hemoglobin: 12.2 g/dL — ABNORMAL LOW (ref 13.0–17.0)
MCH: 31.4 pg (ref 26.0–34.0)
MCHC: 32.7 g/dL (ref 30.0–36.0)
MCV: 95.9 fL (ref 78.0–100.0)
Platelets: 189 10*3/uL (ref 150–400)
RBC: 3.89 MIL/uL — ABNORMAL LOW (ref 4.22–5.81)
RDW: 13.3 % (ref 11.5–15.5)
WBC: 11.3 10*3/uL — AB (ref 4.0–10.5)

## 2016-08-05 LAB — BASIC METABOLIC PANEL
Anion gap: 10 (ref 5–15)
BUN: 25 mg/dL — ABNORMAL HIGH (ref 6–20)
CO2: 23 mmol/L (ref 22–32)
CREATININE: 1.35 mg/dL — AB (ref 0.61–1.24)
Calcium: 8.9 mg/dL (ref 8.9–10.3)
Chloride: 109 mmol/L (ref 101–111)
GFR calc Af Amer: 50 mL/min — ABNORMAL LOW (ref >60)
GFR calc non Af Amer: 43 mL/min — ABNORMAL LOW (ref >60)
GLUCOSE: 122 mg/dL — AB (ref 65–99)
Potassium: 4.1 mmol/L (ref 3.5–5.1)
Sodium: 142 mmol/L (ref 135–145)

## 2016-08-05 LAB — URINE CULTURE: Culture: >=100000 — AB

## 2016-08-05 LAB — GLUCOSE, CAPILLARY
GLUCOSE-CAPILLARY: 123 mg/dL — AB (ref 65–99)
GLUCOSE-CAPILLARY: 67 mg/dL (ref 65–99)
GLUCOSE-CAPILLARY: 92 mg/dL (ref 65–99)
Glucose-Capillary: 252 mg/dL — ABNORMAL HIGH (ref 65–99)
Glucose-Capillary: 68 mg/dL (ref 65–99)
Glucose-Capillary: 71 mg/dL (ref 65–99)

## 2016-08-05 MED ORDER — AMOXICILLIN 250 MG/5ML PO SUSR: 500.0000 mg | Freq: Two times a day (BID) | ORAL | 0 refills | Status: AC

## 2016-08-05 MED ORDER — AMOXICILLIN 250 MG/5ML PO SUSR
500.0000 mg | Freq: Two times a day (BID) | ORAL | Status: DC
  Filled 2016-08-05: qty 10

## 2016-08-05 MED ORDER — ENSURE ENLIVE PO LIQD: 237.0000 mL | Freq: Two times a day (BID) | ORAL | 12 refills | Status: DC

## 2016-08-05 MED ORDER — AMLODIPINE BESYLATE 2.5 MG PO TABS: 10.0000 mg | ORAL_TABLET | Freq: Every day | ORAL | 1 refills | Status: DC

## 2016-08-05 MED ORDER — AMOXICILLIN 250 MG/5ML PO SUSR
500.0000 mg | Freq: Two times a day (BID) | ORAL | Status: DC
  Administered 2016-08-05: 500 mg via ORAL
  Filled 2016-08-05 (×2): qty 10

## 2016-08-05 MED ORDER — GLIPIZIDE 5 MG PO TABS
2.5000 mg | ORAL_TABLET | Freq: Every day | ORAL | Status: DC
  Administered 2016-08-05: 2.5 mg via ORAL

## 2016-08-05 MED ORDER — INSULIN ASPART 100 UNIT/ML ~~LOC~~ SOLN: SUBCUTANEOUS | 11 refills | Status: DC

## 2016-08-05 MED ORDER — GLIPIZIDE 5 MG PO TABS: 2.5000 mg | ORAL_TABLET | Freq: Every day | ORAL | 1 refills | Status: DC

## 2016-08-05 NOTE — Progress Notes (Signed)
Attempted report to [facility]. Family told to notify staff to check patient's cbg on arrival

## 2016-08-05 NOTE — Progress Notes (Signed)
  Speech Language Pathology Treatment: Dysphagia  Patient Details Name: Clayton [name] MRN: [id number] DOB: 01/27/1920 Today's Date: 08/05/2016 Time: 4098-1191 SLP Time Calculation (min) (ACUTE ONLY): 19 min  Assessment / Plan / Recommendation Clinical Impression  Received new order stating pt's decreased po intake and asking if diet/liquid upgrade is possible. Briefly spoke with pt earlier this morning (no family present yet) and pt's vocal quality extremely wet indicating aspiration of his secretions. SLP returned and and discussed/reviewed education of MBS, recommendations, water protocol etc. Explained that he is not yet appropriate for a repeat MBS. Options are to continue Dys 1, honey thick and water protocol or no limitations/po's with known risks. Daughter verbalized understanding and wishes to continue with Dys 1, honey and free water only 30 min after eating, after oral care and not during meals or with meds. SLP attempted to explain this to pt this morning and he appeared to be having difficulty grasping concepts. Observed with water following oral care with daughter. Pt is silent aspirator and explained to daughter that cannot judge how he is doing with observation alone. Follow 1-2 more times for continued education. Daughter asking if exercises were still appropriate. Explained pitch glides and Masako may be although time post stroke may be more influential than exercises. Pt has acute CVA therefore EMST not warranted.     HPI HPI: 83 y.o. male with baseline dysarthria and hx DMII, HTN, arrived to ED from home with aphasia, right-sided weakness. Head CT early left parietal CVA. Hx of dysphagia, hence failed RN stroke swallow screen. Evaluated by SLP during hospitalization in [facility] Dec 2016 - clinical swallow evaluation completed and pureed diet, thin liquids recommended. Pt had OP SLP services upon return home. MD reordered ST on 08/04/2016 due to poor oral intake of current diet (Dys  1/honey thick) and for possible upgrade.      SLP Plan  Continue with current plan of care       Recommendations  Diet recommendations: Dysphagia 1 (puree);Honey-thick liquid Liquids provided via: Cup Medication Administration: Crushed with puree Supervision: Full supervision/cueing for compensatory strategies;Patient able to self feed Compensations: Slow rate;Small sips/bites;Minimize environmental distractions;Multiple dry swallows after each bite/sip;Clear throat intermittently Postural Changes and/or Swallow Maneuvers: Seated upright 90 degrees;Upright 30-60 min after meal                Oral Care Recommendations: Oral care prior to ice chip/H20 Follow up Recommendations: Skilled Nursing facility SLP Visit Diagnosis: Dysphagia, oropharyngeal phase (R13.12) Plan: Continue with current plan of care       GO                Royce Macadamia 08/05/2016, 12:09 PM  Breck Coons Lonell Face.Ed [employer] [phone]

## 2016-08-05 NOTE — Progress Notes (Signed)
Hypoglycemic Event  CBG: 68  Treatment: Patient ate dinner. Estimated about 30% eaten, patient refused to eat more.  Symptoms: None  Follow-up CBG: Time: See results review CBG Result:71, follow up after dinner was 92  Possible Reasons for Event: Poor PO intake, given glipizide.  Comments/MD notified: Dr. Susie Cassette notified, patient ate 30% of dinner, refused to eat more. Per Dr. Susie Cassette, continue with discharge, notify nurse receiving [facility] at [facility] that patient's cbg must be taken upon arrival.    Arlyce Harman

## 2016-08-05 NOTE — Progress Notes (Signed)
[facility] PROGRESS NOTE  Clayton [name] ZOX:[id number] DOB: 1920/01/23 DOA: 07/31/2016   PCP: Gwen Pounds, MD     Assessment/Plan: Principal Problem:   Stroke (HCC) Active Problems:   Essential hypertension, benign   Diabetes mellitus without complication (HCC)   paroxysmal afib   CKD (chronic kidney disease), stage III   Aphasia   Aortic atherosclerosis (HCC)   83 y.o. male with medical history significant of DM type II,  CKD, HTN, PAF, anemia who presented to the ED via EMS with c/o slurred speech and right sided weakness. Admitted for stroke workup. MRI positive for stroke  Assessment and plan Acute ischemic stroke Patient was seen by neuro team MRI Acute multifocal small LEFT frontal lobe infarcts (MCA territory) CT head and neck- No emergent large vessel occlusion Continue aspirin, stroke team following and will reassess need for antiplatelet rx  Hemoglobin A1c 7.2 LDL 90, HDL 41, triglycerides 134 PT/OT-CIR/speech therapy  Dysphagia 1 (puree);Honey-thick liquid;Thin liquid (thin water only) 2-D echo EF 55-60% Patient has a history of paroxysmal  not on anticoagulation at home Recommend 30 day cardiac event monitoring as outpt to rule out afib, requested [facility] to arrange 3/30 at 10:30 am  ASA 325mg  and plavix 75mg  for 3 months and then plavix alone.  Pt will follow up with [facility] in about 6 weeks Aspiration precautions and fall precautions at all times  Patient needs continued Speech Lanaguage Pathology Services Patient will need 24 /7 care, encouragement for feeding/drinking, given his advanced age-calorie count prior to discharge to ensure that the patient is meeting his daily requirements Did offer NG tube placement for tube feeding, if family was very concerned about dehydration. They do not want NG tube at this point and would like to work with speech therapy to see how he can advance his diet. Nutrition consult-document by mouth  intake has been pending for 2 days   Hypertension Continue home meds    Paroxysmal AFib -not on any anticoagulation therapy Patient will be placed on regular dose aspirin and plavix per neuro Neurology does not recommend anticoagulation at this time  DM type II - most recent HgbA1C is 6.4% on 06/22/16  Continue metformin and renal function remained stable Consider discontinuation of metformin if creatinine greater than 1.4  UTI Urine culture shows enterococcus, antibiotic changed from Rocephin to amoxicillin  CKD stage III-baseline around 1.4-1.5, creatinine stable at this time, improved now down to 1.2   DVT prophylaxsis heparin  Code Status:  DNR     Family Communication: Called patient's daughter and wife, son and daughter-in-law by the bedside and discussed current findings with them Disposition Plan: Awaiting bed at [facility] 4/2    Consultants:  Neurology  Procedures:  None  Antibiotics: Anti-infectives    Start     Dose/Rate Route Frequency Ordered Stop   08/05/16 1400  amoxicillin (AMOXIL) 250 MG/5ML suspension 500 mg     500 mg Oral Every 12 hours 08/05/16 1325     08/05/16 0000  amoxicillin (AMOXIL) 250 MG/5ML suspension     500 mg Oral Every 12 hours 08/05/16 1327 08/08/16 2359   08/03/16 0000  cephALEXin (KEFLEX) 500 MG capsule  Status:  Discontinued     500 mg Oral 2 times daily 08/03/16 1029 08/05/16    07/31/16 1900  cefTRIAXone (ROCEPHIN) 1 g in dextrose 5 % 50 mL IVPB  Status:  Discontinued     1 g 100 mL/hr over 30  Minutes Intravenous Every 24 hours 07/31/16 1845 08/05/16 1325         HPI/Subjective: Patient continues to have poor cognition, expressive aphasia    Objective: Vitals:   08/04/16 2134 08/05/16 0123 08/05/16 0511 08/05/16 0929  BP: (!) 152/60 (!) 167/95 (!) 145/80 (!) 150/81  Pulse: 93 78 71 74  Resp: Temp: 97.9 F (36.6 C) 97.9 F (36.6 C) 97.7 F (36.5 C) 97.7 F (36.5 C)  TempSrc: Oral Oral Axillary  Oral  SpO2: 96% 98% 95% 96%  Weight:      Height:        Intake/Output Summary (Last 24 hours) at 08/05/16 1334 Last data filed at 08/05/16 1300  Gross per 24 hour  Intake             1950 ml  Output             1300 ml  Net              650 ml    Exam:  Examination:  General exam: Appears calm and comfortable  Respiratory system: Clear to auscultation. Respiratory effort normal. Cardiovascular system: S1 & S2 heard, RRR. No JVD, murmurs, rubs, gallops or clicks. No pedal edema. Gastrointestinal system: Abdomen is nondistended, soft and nontender. No organomegaly or masses felt. Normal bowel sounds heard. Central nervous system: Awake,expressive dysarthria Extremities: Symmetric 5 x 5 power. Skin: No rashes, lesions or ulcers Psychiatry: Judgement impaired.     Data Reviewed: I have personally reviewed following labs and imaging studies  Micro Results Recent Results (from the past 240 hour(s))  Culture, Urine     Status: Abnormal   Collection Time: 08/03/16  2:32 AM  Result Value Ref Range Status   Specimen Description URINE, RANDOM  Final   Special Requests NONE  Final   Culture >=100,000 COLONIES/mL ENTEROCOCCUS FAECALIS (A)  Final   Report Status 08/05/2016 FINAL  Final   Organism ID, Bacteria ENTEROCOCCUS FAECALIS (A)  Final      Susceptibility   Enterococcus faecalis - MIC*    AMPICILLIN <=2 SENSITIVE Sensitive     LEVOFLOXACIN 0.5 SENSITIVE Sensitive     NITROFURANTOIN <=16 SENSITIVE Sensitive     VANCOMYCIN 1 SENSITIVE Sensitive     * >=100,000 COLONIES/mL ENTEROCOCCUS FAECALIS    Radiology Reports Ct Angio Head W Or Wo Contrast  Result Date: 07/31/2016 CLINICAL DATA:  Aphasia.  Right arm and leg drift. EXAM: CT ANGIOGRAPHY HEAD AND NECK TECHNIQUE: Multidetector CT imaging of the head and neck was performed using the standard protocol during bolus administration of intravenous contrast. Multiplanar CT image reconstructions and MIPs were obtained to evaluate  the vascular anatomy. Carotid stenosis measurements (when applicable) are obtained utilizing NASCET criteria, using the distal internal carotid diameter as the denominator. CONTRAST:  50 mL Isovue 370 COMPARISON:  Head CT 07/31/2016 and MRI 02/07/2015. No prior angiographic imaging. FINDINGS: CTA NECK FINDINGS Aortic arch: Standard 3 vessel aortic arch with heavy calcified and irregular soft plaque projecting into the lumen. Non-stenotic plaque in the brachiocephalic and right subclavian arteries. Mild narrowing of the proximal left subclavian artery due to soft and calcified plaque. Right carotid system: Moderate soft and calcified plaque in the mid to distal the common carotid artery without significant stenosis. Extensive, heavily calcified plaque at the bifurcation and carotid bulb results in approximately 50% proximal ICA stenosis. Left carotid system: Mild-to-moderate calcified and soft plaque in the mid to distal common carotid artery without significant  stenosis. Eccentric, calcified plaque in the proximal ICA results in 30% stenosis. There is moderate proximal ECA stenosis due to soft plaque. Vertebral arteries: Patent with the left being slightly larger than the right. No evidence of dissection or significant stenosis. Slight irregularity of the V3 segments bilaterally for which mild fibromuscular dysplasia is not excluded. Skeleton: Multilevel cervical disc and facet degeneration. Interbody and posterior element ankylosis at C3-4, C4-5, and C6-7. Other neck: No mass or lymph node enlargement. Upper chest: Partially visualized mosaic attenuation in the left upper lobe, possibly air trapping. Review of the MIP images confirms the above findings CTA HEAD FINDINGS Anterior circulation: The internal carotid arteries are patent from skullbase to carotid termini with diffuse siphon atherosclerosis bilaterally. There is mild cavernous and supraclinoid segment stenosis on the left. The MCAs are patent without  evidence of major branch occlusion or significant proximal stenosis. There is an early MCA bifurcation on the left. There is mild asymmetric attenuation of distal left MCA branch vessels. The ACAs are patent without evidence of major branch occlusion or significant proximal stenosis. The left A1 segment is hypoplastic. No aneurysm. Posterior circulation: The intracranial vertebral arteries are patent with mild non stenotic calcified plaque in the left V4 segment. Patent PICA, AICA, and SCA origins are visualized bilaterally. The basilar artery is patent without stenosis. There is a prominent right posterior communicating artery with markedly hypoplastic or absent/ occluded right P1 segment. No significant PCA stenosis is identified. No aneurysm. Venous sinuses: Not well evaluated due to contrast timing. Anatomic variants: Fetal origin of the right PCA. Hypoplastic left A1. Delayed phase: Not performed. Review of the MIP images confirms the above findings IMPRESSION: 1. No emergent large vessel occlusion. 2. Advanced aortic atherosclerosis. 3. 50% proximal right ICA stenosis. 4. 30% proximal left ICA stenosis. 5. Mild intracranial left ICA stenosis. Preliminary findings of no large vessel occlusion were called by telephone at the time of interpretation on 07/31/2016 at 10:45 am to Dr. Amada Jupiter, who verbally acknowledged these results. Electronically Signed   By: Sebastian Ache M.D.   On: 07/31/2016 11:09   Ct Angio Neck W Or Wo Contrast  Result Date: 07/31/2016 CLINICAL DATA:  Aphasia.  Right arm and leg drift. EXAM: CT ANGIOGRAPHY HEAD AND NECK TECHNIQUE: Multidetector CT imaging of the head and neck was performed using the standard protocol during bolus administration of intravenous contrast. Multiplanar CT image reconstructions and MIPs were obtained to evaluate the vascular anatomy. Carotid stenosis measurements (when applicable) are obtained utilizing NASCET criteria, using the distal internal carotid  diameter as the denominator. CONTRAST:  50 mL Isovue 370 COMPARISON:  Head CT 07/31/2016 and MRI 02/07/2015. No prior angiographic imaging. FINDINGS: CTA NECK FINDINGS Aortic arch: Standard 3 vessel aortic arch with heavy calcified and irregular soft plaque projecting into the lumen. Non-stenotic plaque in the brachiocephalic and right subclavian arteries. Mild narrowing of the proximal left subclavian artery due to soft and calcified plaque. Right carotid system: Moderate soft and calcified plaque in the mid to distal the common carotid artery without significant stenosis. Extensive, heavily calcified plaque at the bifurcation and carotid bulb results in approximately 50% proximal ICA stenosis. Left carotid system: Mild-to-moderate calcified and soft plaque in the mid to distal common carotid artery without significant stenosis. Eccentric, calcified plaque in the proximal ICA results in 30% stenosis. There is moderate proximal ECA stenosis due to soft plaque. Vertebral arteries: Patent with the left being slightly larger than the right. No evidence of dissection or significant stenosis.  Slight irregularity of the V3 segments bilaterally for which mild fibromuscular dysplasia is not excluded. Skeleton: Multilevel cervical disc and facet degeneration. Interbody and posterior element ankylosis at C3-4, C4-5, and C6-7. Other neck: No mass or lymph node enlargement. Upper chest: Partially visualized mosaic attenuation in the left upper lobe, possibly air trapping. Review of the MIP images confirms the above findings CTA HEAD FINDINGS Anterior circulation: The internal carotid arteries are patent from skullbase to carotid termini with diffuse siphon atherosclerosis bilaterally. There is mild cavernous and supraclinoid segment stenosis on the left. The MCAs are patent without evidence of major branch occlusion or significant proximal stenosis. There is an early MCA bifurcation on the left. There is mild asymmetric  attenuation of distal left MCA branch vessels. The ACAs are patent without evidence of major branch occlusion or significant proximal stenosis. The left A1 segment is hypoplastic. No aneurysm. Posterior circulation: The intracranial vertebral arteries are patent with mild non stenotic calcified plaque in the left V4 segment. Patent PICA, AICA, and SCA origins are visualized bilaterally. The basilar artery is patent without stenosis. There is a prominent right posterior communicating artery with markedly hypoplastic or absent/ occluded right P1 segment. No significant PCA stenosis is identified. No aneurysm. Venous sinuses: Not well evaluated due to contrast timing. Anatomic variants: Fetal origin of the right PCA. Hypoplastic left A1. Delayed phase: Not performed. Review of the MIP images confirms the above findings IMPRESSION: 1. No emergent large vessel occlusion. 2. Advanced aortic atherosclerosis. 3. 50% proximal right ICA stenosis. 4. 30% proximal left ICA stenosis. 5. Mild intracranial left ICA stenosis. Preliminary findings of no large vessel occlusion were called by telephone at the time of interpretation on 07/31/2016 at 10:45 am to Dr. Amada Jupiter, who verbally acknowledged these results. Electronically Signed   By: Sebastian Ache M.D.   On: 07/31/2016 11:09   Mr Brain Wo Contrast  Result Date: 07/31/2016 CLINICAL DATA:  Slurred speech and RIGHT-sided weakness, last seen normal last night. History of diabetes, hypertension. EXAM: MRI HEAD WITHOUT CONTRAST TECHNIQUE: Multiplanar, multiecho pulse sequences of the brain and surrounding structures were obtained without intravenous contrast. COMPARISON:  CTA HEAD and neck July 31, 2016 at 1034 hours. FINDINGS: BRAIN: Faint patchy reduced diffusion LEFT frontal lobe including the operculum. Corresponding low ADC values with faint susceptibility artifact LEFT frontal cortex. Moderate to severe ventriculomegaly on the basis of global parenchymal brain volume  loss. Confluent supratentorial white matter FLAIR T2 hyperintensities. No midline shift, mass effect or masses. Old RIGHT caudate head lacunar infarct. No abnormal extra-axial fluid collections. VASCULAR: Major intracranial vascular flow voids present at skull base, dolichoectasia associated with chronic hypertension. SKULL AND UPPER CERVICAL SPINE: No abnormal sellar expansion. No suspicious calvarial bone marrow signal. Craniocervical junction maintained. SINUSES/ORBITS: The mastoid air-cells and included paranasal sinuses are well-aerated. The included ocular globes and orbital contents are non-suspicious. Status post bilateral ocular lens implants. OTHER: None. IMPRESSION: Acute multifocal small LEFT frontal lobe infarcts (MCA territory) with faint petechial hemorrhage. Chronic changes include old RIGHT caudate head lacunar infarct and moderate chronic small vessel ischemic disease. Electronically Signed   By: Awilda Metro M.D.   On: 07/31/2016 19:57   Ct Head Code Stroke Wo Contrast`  Result Date: 07/31/2016 CLINICAL DATA:  Code stroke.  Aphasia.  Right arm and leg drift. EXAM: CT HEAD WITHOUT CONTRAST TECHNIQUE: Contiguous axial images were obtained from the base of the skull through the vertex without intravenous contrast. COMPARISON:  Brain MRI 02/07/2015 FINDINGS: Brain: There is  no evidence of acute intracranial hemorrhage, mass, midline shift, or extra-axial fluid collection. There is age related cerebral atrophy. Patchy to confluent bilateral cerebral white matter hypodensities are nonspecific but compatible with moderate chronic small vessel ischemic disease. There is mildly asymmetric white matter hypoattenuation at the level of the posterior left corona radiata (series 201, image 21) with possible early loss of gray- white differentiation involving the overlying cortex. There is a chronic lacunar infarct in the right caudate nucleus. Vascular: Calcified atherosclerosis at the skullbase. No  proximal dense MCA. Slightly increased density of a small MCA branch vessel posteriorly in the left sylvian fissure, which may represent a distal M2/ proximal M3 embolus versus atherosclerosis. Skull: No fracture or focal osseous lesion. Sinuses/Orbits: Bilateral cataract extraction. No significant sinus disease. Other: None. ASPECTS [facility] Stroke Program Early CT Score) - Ganglionic level infarction (caudate, lentiform nuclei, internal capsule, insula, M1-M3 cortex): 7 - Supraganglionic infarction (M4-M6 cortex): 2 Total score (0-10 with 10 being normal): 9 IMPRESSION: 1. No acute intracranial hemorrhage. 2. Possible early left parietal lobe infarct. 3. ASPECTS is 9. 4. Moderate chronic small vessel ischemic disease. These results were called by telephone at the time of interpretation on 07/31/2016 at 10:30 am to Dr. Amada Jupiter, who verbally acknowledged these results. Electronically Signed   By: Sebastian Ache M.D.   On: 07/31/2016 10:39     CBC  Recent Labs Lab 07/31/16 0952 07/31/16 1010 08/01/16 0526 08/03/16 0411  WBC 9.2  --  9.6 11.0*  HGB 13.3 13.6 12.5* 12.4*  HCT 40.5 40.0 38.2* 37.3*  PLT 211  --  219 189  MCV 96.2  --  96.5 95.2  MCH 31.6  --  31.6 31.6  MCHC 32.8  --  32.7 33.2  RDW 13.2  --  13.1 13.2  LYMPHSABS 1.3  --   --   --   MONOABS 0.7  --   --   --   EOSABS 0.5  --   --   --   BASOSABS 0.1  --   --   --     Chemistries   Recent Labs Lab 07/31/16 0952 07/31/16 1010 08/01/16 0526 08/03/16 0411 08/04/16 1005  NA 138 139 140 140 139  K 4.5 4.5 4.1 4.0 4.0  CL 102 103 106 107 110  CO2 25  --  GLUCOSE 155* 154* 122* 116* 211*  BUN 28* 32* 24* 19 19  CREATININE 1.41* 1.40* 1.30* 1.22 1.29*  CALCIUM 9.6  --  9.0 8.6* 8.5*  AST 18  --   --  19  --   ALT 11*  --   --  12*  --   ALKPHOS 78  --   --  67  --   BILITOT 0.7  --   --  0.6  --     ------------------------------------------------------------------------------------------------------------------ estimated creatinine clearance is 31.6 mL/min (A) (by C-G formula based on SCr of 1.29 mg/dL (H)). ------------------------------------------------------------------------------------------------------------------ No results for input(s): HGBA1C in the last 72 hours. ------------------------------------------------------------------------------------------------------------------ No results for input(s): CHOL, HDL, LDLCALC, TRIG, CHOLHDL, LDLDIRECT in the last 72 hours. ------------------------------------------------------------------------------------------------------------------ No results for input(s): TSH, T4TOTAL, T3FREE, THYROIDAB in the last 72 hours.  Invalid input(s): FREET3 ------------------------------------------------------------------------------------------------------------------ No results for input(s): VITAMINB12, FOLATE, FERRITIN, TIBC, IRON, RETICCTPCT in the last 72 hours.  Coagulation profile  Recent Labs Lab 07/31/16 0952  INR 1.02    No results for input(s): DDIMER in the last 72 hours.  Cardiac Enzymes No results for  input(s): CKMB, TROPONINI, MYOGLOBIN in the last 168 hours.  Invalid input(s): CK ------------------------------------------------------------------------------------------------------------------ Invalid input(s): POCBNP   CBG:  Recent Labs Lab 08/04/16 0619 08/04/16 1141 08/04/16 1654 08/05/16 0553 08/05/16 1140  GLUCAP 108* 182* 98 123* 252*       Studies: No results found.    Lab Results  Component Value Date   HGBA1C 7.2 (H) 08/01/2016   HGBA1C 6.4 (H) 06/23/2015   HGBA1C 7.3 (H) 02/07/2015   Lab Results  Component Value Date   LDLCALC 90 08/01/2016   CREATININE 1.29 (H) 08/04/2016       Scheduled Meds: .  stroke: mapping our early stages of recovery book   Does not apply Once  .  amoxicillin  500 mg Oral Q12H  . aspirin  325 mg Oral Daily  . atorvastatin  20 mg Oral q1800  . clopidogrel  75 mg Oral Daily  . feeding supplement (ENSURE ENLIVE)  237 mL Oral BID BM  . glipiZIDE  2.5 mg Oral QAC breakfast  . heparin  5,000 Units Subcutaneous Q8H  . insulin aspart  0-9 Units Subcutaneous TID WC   Continuous Infusions: . sodium chloride 50 mL/hr at 08/05/16 0700     LOS: 5 days    Time spent: >30 MINS    [facility]  [facility] Pager [phone]. If 7PM-7AM, please contact night-coverage at [email or web address], password [facility] 08/05/2016, 1:34 PM  LOS: 5 days

## 2016-08-05 NOTE — Care Management Important Message (Signed)
Important Message  Patient Details  Name: Clayton [name] MRN: [id number] Date of Birth: February 21, 1920   Medicare Important Message Given:  Yes    Suhayb Anzalone Stefan Church 08/05/2016, 4:42 PM

## 2016-08-05 NOTE — Progress Notes (Signed)
Report given to Kelly RN at this time.

## 2016-08-05 NOTE — Progress Notes (Signed)
Occupational Therapy Treatment Patient Details Name: Clayton [name] MRN: [id number] DOB: [birth year] Today's Date: 08/05/2016    History of present illness 83 year old man PMH paroxysmal atrial fibrillation, chronic slurred speech, swallowing difficulty, chronic kidney disease stage III, ICD, diabetes mellitus, admitted due to new onset aphasia.  Head CT early left parietal CVA.   OT comments  Pt progressing toward OT goals. He was able to stand at the sink for grooming tasks with min assist for approximately 5 minutes. Pt followed commands during ADL well this session. He required mod assist for stand-pivot toilet transfer with Rollator this session and verbal cues to prevent standing unsafely. He would continue to benefit from OT services while admitted. Due to insurance denial for CIR, updated D/C plan to SNF for continued rehabilitation services to maximize return to PLOF. Will continue to follow acutely.  Follow Up Recommendations  SNF    Equipment Recommendations  Other (comment) (TBD)    Recommendations for Other Services      Precautions / Restrictions Precautions Precautions: Fall Restrictions Weight Bearing Restrictions: No       Mobility Bed Mobility Overal bed mobility: Needs Assistance Bed Mobility: Sit to Supine       Sit to supine: Mod assist   General bed mobility comments: Mod assist for raising B LE's into bed.  Transfers Overall transfer level: Needs assistance Equipment used: 4-wheeled walker Transfers: Stand Pivot Transfers;Sit to/from Stand Sit to Stand: Mod assist Stand pivot transfers: Mod assist       General transfer comment: Pt eager to stand and required verbal cues to wait for Rollator.    Balance Overall balance assessment: Needs assistance Sitting-balance support: Bilateral upper extremity supported Sitting balance-Leahy Scale: Fair     Standing balance support: Bilateral upper extremity supported Standing balance-Leahy Scale:  Poor Standing balance comment: Flexed posture noted when standing at the sink for ADL. Pt minimally able to correct.                            ADL either performed or assessed with clinical judgement   ADL Overall ADL's : Needs assistance/impaired     Grooming: Minimal assistance;Standing;Wash/dry face Grooming Details (indicate cue type and reason): Mod assist sit<>stand                 Toilet Transfer: Moderate assistance;Stand-pivot             General ADL Comments: Pt able to stand at sink for grooming tasks for approximately 5 mintues with min assist for balance and mod assist to rise to stand.     Vision   Additional Comments: Pt blind at baseline per family due to macular degeneration.   Perception     Praxis      Cognition Arousal/Alertness: Awake/alert Behavior During Therapy: Flat affect;WFL for tasks assessed/performed Overall Cognitive Status: Difficult to assess                                 General Comments: Able to follow commands well during ADL. Pt speech was difficult to interpret.  Better with yes/no questions.        Exercises General Exercises - Upper Extremity Shoulder Flexion: AROM;Both;5 reps General Exercises - Lower Extremity Hip Flexion/Marching: Both Toe Raises: 15 reps;Seated   Shoulder Instructions       General Comments Pt had increased salivation at start of session.  He had a very flat affect which made it difficult to assess fatigue and cognitive status.      Pertinent Vitals/ Pain       Pain Assessment: No/denies pain Faces Pain Scale: No hurt Pain Intervention(s): Monitored during session  Home Living                                          Prior Functioning/Environment              Frequency  Min 2X/week        Progress Toward Goals  OT Goals(current goals can now be found in the care plan section)  Progress towards OT goals: Progressing toward  goals  Acute Rehab OT Goals Patient Stated Goal: To return to ILF OT Goal Formulation: With patient/family Time For Goal Achievement: 08/08/16 Potential to Achieve Goals: Good ADL Goals Pt Will Perform Grooming: with min guard assist;sitting Pt Will Perform Upper Body Bathing: with mod assist;sitting Pt Will Perform Lower Body Bathing: with mod assist;sitting/lateral leans Pt Will Perform Upper Body Dressing: with mod assist;sitting Pt Will Transfer to Toilet: with min assist;bedside commode;ambulating;regular height toilet;grab bars Additional ADL Goal #1: B UE strengthening exercises seated EOB to increase strength and acitivity tolerance for functional tasks  Plan Discharge plan needs to be updated    Co-evaluation                 End of Session Equipment Utilized During Treatment: Gait belt  OT Visit Diagnosis: Unsteadiness on feet (R26.81);Muscle weakness (generalized) (M62.81);Other symptoms and signs involving the nervous system (R29.898)   Activity Tolerance Patient limited by fatigue   Patient Left in bed;with call bell/phone within reach;with family/visitor present   Nurse Communication Mobility status        Time: 1610-9604 OT Time Calculation (min): 27 min  Charges: OT General Charges $OT Visit: 1 Procedure OT Treatments $Self Care/Home Management : 23-37 mins  Doristine Section, MS OTR/L  Pager: [phone]    [name] 08/05/2016, 2:54 PM

## 2016-08-05 NOTE — Progress Notes (Signed)
Patient will discharge to [facility] SNF Anticipated discharge date: 4/2 Family notified: dtr at bedside Transportation by pt dtr Report #: [phone]  CSW signing off.  Burna Sis, LCSW Clinical Social Worker [phone]

## 2016-08-05 NOTE — Discharge Summary (Addendum)
Physician Discharge Summary  EAMONN SERMENO MRN: [id number] DOB/AGE: Mar 14, 1920 83 y.o.  PCP: Gwen Pounds, MD   Admit date: 07/31/2016 Discharge date: 08/05/2016  Discharge Diagnoses:    Principal Problem:   Stroke [facility]) Active Problems:   Essential hypertension, benign   Diabetes mellitus without complication (HCC)   paroxysmal afib   CKD (chronic kidney disease), stage III   Aphasia   Aortic atherosclerosis (HCC)       Follow-up recommendations Follow-up with PCP in 3-5 days , including all  additional recommended appointments as below Follow-up CBC, CMP in 3-5 days Pt will follow up with Darrol Angel NP at [facility] in about 6 weeks Aspiration precautions and fall precautions at all times   Speech therapy recommendations  Diet recommendations: Dysphagia 1 (puree);Honey-thick liquid;Thin liquid (thin water only) Liquids provided via: Cup;Teaspoon;Straw Medication Administration: Crushed with puree Supervision: Full supervision/cueing for compensatory strategies Compensations: Slow rate;Small sips/bites;Minimize environmental distractions;Multiple dry swallows after each bite/sip;Clear throat intermittently Postural Changes and/or Swallow Maneuvers: Seated upright 90 degrees;Upright 30-60 min after meal Patient needs continued [facility] Services  Consider palliative care services if no improvement in one week,overall prognosis guarded,discussed with daughter in presence of RN accuchecks 3 times a day, very labile CBG's in the hospital   Current Discharge Medication List    START taking these medications   Details  amoxicillin (AMOXIL) 250 MG/5ML suspension Take 10 mLs (500 mg total) by mouth every 12 (twelve) hours. Qty: 150 mL, Refills: 0    aspirin 325 MG tablet Take 1 tablet (325 mg total) by mouth daily. Qty: 30 tablet, Refills: 1    atorvastatin (LIPITOR) 20 MG tablet Take 1 tablet (20 mg total) by mouth daily at 6 PM. Qty: 30 tablet, Refills:  1    clopidogrel (PLAVIX) 75 MG tablet Take 1 tablet (75 mg total) by mouth daily. Qty: 30 tablet, Refills: 1    feeding supplement, ENSURE ENLIVE, (ENSURE ENLIVE) LIQD Take 237 mLs by mouth 2 (two) times daily between meals. Qty: 237 mL, Refills: 12    glipiZIDE (GLUCOTROL) 5 MG tablet Take 0.5 tablets (2.5 mg total) by mouth daily before breakfast. Qty: 30 tablet, Refills: 1    insulin aspart (NOVOLOG) 100 UNIT/ML injection Correction coverage: Sensitive (thin, NPO, renal)  CBG < 70: implement hypoglycemia protocol  CBG 70 - 120: 0 units  CBG 121 - 150: 1 unit  CBG 151 - 200: 2 units  CBG 201 - 250: 3 units  CBG 251 - 300: 5 units  CBG 301 - 350: 7 units  CBG 351 - 400 9 units  CBG > 400 call MD and obtain STAT lab verification Qty: 10 mL, Refills: 11    polyethylene glycol (MIRALAX / GLYCOLAX) packet Take 17 g by mouth daily as needed for mild constipation. Qty: 14 each, Refills: 0      CONTINUE these medications which have NOT CHANGED   Details  alendronate (FOSAMAX) 70 MG tablet Take 70 mg by mouth once a week. Take with a full glass of water on an empty stomach. ( Sunday )    amLODipine (NORVASC)10 MG tablet Take 10 mg by mouth at bedtime.    metFORMIN (GLUCOPHAGE-XR) 500 MG 24 hr tablet Take 500 mg by mouth daily with breakfast.      STOP taking these medications     traMADol (ULTRAM) 50 MG tablet          Discharge Condition: Stable Discharge Instructions Get Medicines reviewed and adjusted: Please  take all your medications with you for your next visit with your Primary MD  Please request your Primary MD to go over all hospital tests and procedure/radiological results at the follow up, please ask your Primary MD to get all Hospital records sent to his/her office.  If you experience worsening of your admission symptoms, develop shortness of breath, life threatening emergency, suicidal or homicidal thoughts you must seek medical attention immediately by  calling 911 or calling your MD immediately if symptoms less severe.  You must read complete instructions/literature along with all the possible adverse reactions/side effects for all the Medicines you take and that have been prescribed to you. Take any new Medicines after you have completely understood and accpet all the possible adverse reactions/side effects.   Do not drive when taking Pain medications.   Do not take more than prescribed Pain, Sleep and Anxiety Medications  Special Instructions: If you have smoked or chewed Tobacco in the last 2 yrs please stop smoking, stop any regular Alcohol and or any Recreational drug use.  Wear Seat belts while driving.  Please note  You were cared for by a hospitalist during your hospital stay. Once you are discharged, your primary care physician will handle any further medical issues. Please note that NO REFILLS for any discharge medications will be authorized once you are discharged, as it is imperative that you return to your primary care physician (or establish a relationship with a primary care physician if you do not have one) for your aftercare needs so that they can reassess your need for medications and monitor your lab values.  Discharge Instructions    Ambulatory referral to Neurology    Complete by:  As directed    Follow up with NP Darrol Angel at [facility] in about 2 months. Thanks.   Diet - low sodium heart healthy    Complete by:  As directed    Increase activity slowly    Complete by:  As directed        Allergies  Allergen Reactions  . Sulfa Antibiotics Itching      Disposition: SNF   Consults:  Neurology    Significant Diagnostic Studies:  Ct Angio Head W Or Wo Contrast  Result Date: 07/31/2016 CLINICAL DATA:  Aphasia.  Right arm and leg drift. EXAM: CT ANGIOGRAPHY HEAD AND NECK TECHNIQUE: Multidetector CT imaging of the head and neck was performed using the standard protocol during bolus administration of  intravenous contrast. Multiplanar CT image reconstructions and MIPs were obtained to evaluate the vascular anatomy. Carotid stenosis measurements (when applicable) are obtained utilizing NASCET criteria, using the distal internal carotid diameter as the denominator. CONTRAST:  50 mL Isovue 370 COMPARISON:  Head CT 07/31/2016 and MRI 02/07/2015. No prior angiographic imaging. FINDINGS: CTA NECK FINDINGS Aortic arch: Standard 3 vessel aortic arch with heavy calcified and irregular soft plaque projecting into the lumen. Non-stenotic plaque in the brachiocephalic and right subclavian arteries. Mild narrowing of the proximal left subclavian artery due to soft and calcified plaque. Right carotid system: Moderate soft and calcified plaque in the mid to distal the common carotid artery without significant stenosis. Extensive, heavily calcified plaque at the bifurcation and carotid bulb results in approximately 50% proximal ICA stenosis. Left carotid system: Mild-to-moderate calcified and soft plaque in the mid to distal common carotid artery without significant stenosis. Eccentric, calcified plaque in the proximal ICA results in 30% stenosis. There is moderate proximal ECA stenosis due to soft plaque. Vertebral arteries: Patent with  the left being slightly larger than the right. No evidence of dissection or significant stenosis. Slight irregularity of the V3 segments bilaterally for which mild fibromuscular dysplasia is not excluded. Skeleton: Multilevel cervical disc and facet degeneration. Interbody and posterior element ankylosis at C3-4, C4-5, and C6-7. Other neck: No mass or lymph node enlargement. Upper chest: Partially visualized mosaic attenuation in the left upper lobe, possibly air trapping. Review of the MIP images confirms the above findings CTA HEAD FINDINGS Anterior circulation: The internal carotid arteries are patent from skullbase to carotid termini with diffuse siphon atherosclerosis bilaterally. There is  mild cavernous and supraclinoid segment stenosis on the left. The MCAs are patent without evidence of major branch occlusion or significant proximal stenosis. There is an early MCA bifurcation on the left. There is mild asymmetric attenuation of distal left MCA branch vessels. The ACAs are patent without evidence of major branch occlusion or significant proximal stenosis. The left A1 segment is hypoplastic. No aneurysm. Posterior circulation: The intracranial vertebral arteries are patent with mild non stenotic calcified plaque in the left V4 segment. Patent PICA, AICA, and SCA origins are visualized bilaterally. The basilar artery is patent without stenosis. There is a prominent right posterior communicating artery with markedly hypoplastic or absent/ occluded right P1 segment. No significant PCA stenosis is identified. No aneurysm. Venous sinuses: Not well evaluated due to contrast timing. Anatomic variants: Fetal origin of the right PCA. Hypoplastic left A1. Delayed phase: Not performed. Review of the MIP images confirms the above findings IMPRESSION: 1. No emergent large vessel occlusion. 2. Advanced aortic atherosclerosis. 3. 50% proximal right ICA stenosis. 4. 30% proximal left ICA stenosis. 5. Mild intracranial left ICA stenosis. Preliminary findings of no large vessel occlusion were called by telephone at the time of interpretation on 07/31/2016 at 10:45 am to Dr. Leonel Ramsay, who verbally acknowledged these results. Electronically Signed   By: Logan Bores M.D.   On: 07/31/2016 11:09   Ct Angio Neck W Or Wo Contrast  Result Date: 07/31/2016 CLINICAL DATA:  Aphasia.  Right arm and leg drift. EXAM: CT ANGIOGRAPHY HEAD AND NECK TECHNIQUE: Multidetector CT imaging of the head and neck was performed using the standard protocol during bolus administration of intravenous contrast. Multiplanar CT image reconstructions and MIPs were obtained to evaluate the vascular anatomy. Carotid stenosis measurements (when  applicable) are obtained utilizing NASCET criteria, using the distal internal carotid diameter as the denominator. CONTRAST:  50 mL Isovue 370 COMPARISON:  Head CT 07/31/2016 and MRI 02/07/2015. No prior angiographic imaging. FINDINGS: CTA NECK FINDINGS Aortic arch: Standard 3 vessel aortic arch with heavy calcified and irregular soft plaque projecting into the lumen. Non-stenotic plaque in the brachiocephalic and right subclavian arteries. Mild narrowing of the proximal left subclavian artery due to soft and calcified plaque. Right carotid system: Moderate soft and calcified plaque in the mid to distal the common carotid artery without significant stenosis. Extensive, heavily calcified plaque at the bifurcation and carotid bulb results in approximately 50% proximal ICA stenosis. Left carotid system: Mild-to-moderate calcified and soft plaque in the mid to distal common carotid artery without significant stenosis. Eccentric, calcified plaque in the proximal ICA results in 30% stenosis. There is moderate proximal ECA stenosis due to soft plaque. Vertebral arteries: Patent with the left being slightly larger than the right. No evidence of dissection or significant stenosis. Slight irregularity of the V3 segments bilaterally for which mild fibromuscular dysplasia is not excluded. Skeleton: Multilevel cervical disc and facet degeneration. Interbody and posterior element  ankylosis at C3-4, C4-5, and C6-7. Other neck: No mass or lymph node enlargement. Upper chest: Partially visualized mosaic attenuation in the left upper lobe, possibly air trapping. Review of the MIP images confirms the above findings CTA HEAD FINDINGS Anterior circulation: The internal carotid arteries are patent from skullbase to carotid termini with diffuse siphon atherosclerosis bilaterally. There is mild cavernous and supraclinoid segment stenosis on the left. The MCAs are patent without evidence of major branch occlusion or significant proximal  stenosis. There is an early MCA bifurcation on the left. There is mild asymmetric attenuation of distal left MCA branch vessels. The ACAs are patent without evidence of major branch occlusion or significant proximal stenosis. The left A1 segment is hypoplastic. No aneurysm. Posterior circulation: The intracranial vertebral arteries are patent with mild non stenotic calcified plaque in the left V4 segment. Patent PICA, AICA, and SCA origins are visualized bilaterally. The basilar artery is patent without stenosis. There is a prominent right posterior communicating artery with markedly hypoplastic or absent/ occluded right P1 segment. No significant PCA stenosis is identified. No aneurysm. Venous sinuses: Not well evaluated due to contrast timing. Anatomic variants: Fetal origin of the right PCA. Hypoplastic left A1. Delayed phase: Not performed. Review of the MIP images confirms the above findings IMPRESSION: 1. No emergent large vessel occlusion. 2. Advanced aortic atherosclerosis. 3. 50% proximal right ICA stenosis. 4. 30% proximal left ICA stenosis. 5. Mild intracranial left ICA stenosis. Preliminary findings of no large vessel occlusion were called by telephone at the time of interpretation on 07/31/2016 at 10:45 am to Dr. Leonel Ramsay, who verbally acknowledged these results. Electronically Signed   By: Logan Bores M.D.   On: 07/31/2016 11:09   Mr Brain Wo Contrast  Result Date: 07/31/2016 CLINICAL DATA:  Slurred speech and RIGHT-sided weakness, last seen normal last night. History of diabetes, hypertension. EXAM: MRI HEAD WITHOUT CONTRAST TECHNIQUE: Multiplanar, multiecho pulse sequences of the brain and surrounding structures were obtained without intravenous contrast. COMPARISON:  CTA HEAD and neck July 31, 2016 at 1034 hours. FINDINGS: BRAIN: Faint patchy reduced diffusion LEFT frontal lobe including the operculum. Corresponding low ADC values with faint susceptibility artifact LEFT frontal cortex.  Moderate to severe ventriculomegaly on the basis of global parenchymal brain volume loss. Confluent supratentorial white matter FLAIR T2 hyperintensities. No midline shift, mass effect or masses. Old RIGHT caudate head lacunar infarct. No abnormal extra-axial fluid collections. VASCULAR: Major intracranial vascular flow voids present at skull base, dolichoectasia associated with chronic hypertension. SKULL AND UPPER CERVICAL SPINE: No abnormal sellar expansion. No suspicious calvarial bone marrow signal. Craniocervical junction maintained. SINUSES/ORBITS: The mastoid air-cells and included paranasal sinuses are well-aerated. The included ocular globes and orbital contents are non-suspicious. Status post bilateral ocular lens implants. OTHER: None. IMPRESSION: Acute multifocal small LEFT frontal lobe infarcts (MCA territory) with faint petechial hemorrhage. Chronic changes include old RIGHT caudate head lacunar infarct and moderate chronic small vessel ischemic disease. Electronically Signed   By: Elon Alas M.D.   On: 07/31/2016 19:57   Ct Head Code Stroke Wo Contrast`  Result Date: 07/31/2016 CLINICAL DATA:  Code stroke.  Aphasia.  Right arm and leg drift. EXAM: CT HEAD WITHOUT CONTRAST TECHNIQUE: Contiguous axial images were obtained from the base of the skull through the vertex without intravenous contrast. COMPARISON:  Brain MRI 02/07/2015 FINDINGS: Brain: There is no evidence of acute intracranial hemorrhage, mass, midline shift, or extra-axial fluid collection. There is age related cerebral atrophy. Patchy to confluent bilateral cerebral white matter  hypodensities are nonspecific but compatible with moderate chronic small vessel ischemic disease. There is mildly asymmetric white matter hypoattenuation at the level of the posterior left corona radiata (series 201, image 21) with possible early loss of gray- white differentiation involving the overlying cortex. There is a chronic lacunar infarct in  the right caudate nucleus. Vascular: Calcified atherosclerosis at the skullbase. No proximal dense MCA. Slightly increased density of a small MCA branch vessel posteriorly in the left sylvian fissure, which may represent a distal M2/ proximal M3 embolus versus atherosclerosis. Skull: No fracture or focal osseous lesion. Sinuses/Orbits: Bilateral cataract extraction. No significant sinus disease. Other: None. ASPECTS [facility] Stroke Program Early CT Score) - Ganglionic level infarction (caudate, lentiform nuclei, internal capsule, insula, M1-M3 cortex): 7 - Supraganglionic infarction (M4-M6 cortex): 2 Total score (0-10 with 10 being normal): 9 IMPRESSION: 1. No acute intracranial hemorrhage. 2. Possible early left parietal lobe infarct. 3. ASPECTS is 9. 4. Moderate chronic small vessel ischemic disease. These results were called by telephone at the time of interpretation on 07/31/2016 at 10:30 am to Dr. Leonel Ramsay, who verbally acknowledged these results. Electronically Signed   By: Logan Bores M.D.   On: 07/31/2016 10:39    echocardiogram  LV EF: 55% -   60%  ------------------------------------------------------------------- History:   PMH:   Atrial fibrillation.  Stroke.  Risk factors: Hypertension. Diabetes mellitus.  ------------------------------------------------------------------- Study Conclusions  - Left ventricle: The cavity size was normal. Wall thickness was   increased in a pattern of moderate LVH. There was focal basal   hypertrophy. Systolic function was normal. The estimated ejection   fraction was in the range of 55% to 60%. Wall motion was normal;   there were no regional wall motion abnormalities. Doppler   parameters are consistent with abnormal left ventricular   relaxation (grade 1 diastolic dysfunction). - Aortic valve: There was very mild stenosis. Valve area (VTI):   1.69 cm^2. Valve area (Vmax): 1.61 cm^2. Valve area (Vmean): 1.63   cm^2. - Mitral valve: Mildly  calcified annulus. Mildly thickened, mildly   calcified leaflets . There was moderate regurgitation. - Right atrium: The atrium was mildly dilated.     Filed Weights   07/31/16 0936 07/31/16 1000  Weight: 65.8 kg (145 lb) 63.9 kg (140 lb 14 oz)     Microbiology: Recent Results (from the past 240 hour(s))  Culture, Urine     Status: Abnormal   Collection Time: 08/03/16  2:32 AM  Result Value Ref Range Status   Specimen Description URINE, RANDOM  Final   Special Requests NONE  Final   Culture >=100,000 COLONIES/mL ENTEROCOCCUS FAECALIS (A)  Final   Report Status 08/05/2016 FINAL  Final   Organism ID, Bacteria ENTEROCOCCUS FAECALIS (A)  Final      Susceptibility   Enterococcus faecalis - MIC*    AMPICILLIN <=2 SENSITIVE Sensitive     LEVOFLOXACIN 0.5 SENSITIVE Sensitive     NITROFURANTOIN <=16 SENSITIVE Sensitive     VANCOMYCIN 1 SENSITIVE Sensitive     * >=100,000 COLONIES/mL ENTEROCOCCUS FAECALIS       Blood Culture    Component Value Date/Time   SDES URINE, RANDOM 08/03/2016 0232   SPECREQUEST NONE 08/03/2016 0232   CULT >=100,000 COLONIES/mL ENTEROCOCCUS FAECALIS (A) 08/03/2016 0232   REPTSTATUS 08/05/2016 FINAL 08/03/2016 0232      Labs: Results for orders placed or performed during the hospital encounter of 07/31/16 (from the past 48 hour(s))  Glucose, capillary     Status: None  Collection Time: 08/03/16  4:43 PM  Result Value Ref Range   Glucose-Capillary 95 65 - 99 mg/dL   Comment 1 Notify RN    Comment 2 Document in Chart   Glucose, capillary     Status: Abnormal   Collection Time: 08/03/16  9:14 PM  Result Value Ref Range   Glucose-Capillary 163 (H) 65 - 99 mg/dL   Comment 1 Notify RN    Comment 2 Document in Chart   Glucose, capillary     Status: Abnormal   Collection Time: 08/04/16  6:19 AM  Result Value Ref Range   Glucose-Capillary 108 (H) 65 - 99 mg/dL   Comment 1 Notify RN    Comment 2 Document in Chart   Basic metabolic panel     Status:  Abnormal   Collection Time: 08/04/16 10:05 AM  Result Value Ref Range   Sodium 139 135 - 145 mmol/L   Potassium 4.0 3.5 - 5.1 mmol/L   Chloride 110 101 - 111 mmol/L   CO2 22 22 - 32 mmol/L   Glucose, Bld 211 (H) 65 - 99 mg/dL   BUN 19 6 - 20 mg/dL   Creatinine, Ser 1.29 (H) 0.61 - 1.24 mg/dL   Calcium 8.5 (L) 8.9 - 10.3 mg/dL   GFR calc non Af Amer 46 (L) >60 mL/min   GFR calc Af Amer 53 (L) >60 mL/min    Comment: (NOTE) The eGFR has been calculated using the CKD EPI equation. This calculation has not been validated in all clinical situations. eGFR's persistently <60 mL/min signify possible Chronic Kidney Disease.    Anion gap 7 5 - 15  Glucose, capillary     Status: Abnormal   Collection Time: 08/04/16 11:41 AM  Result Value Ref Range   Glucose-Capillary 182 (H) 65 - 99 mg/dL  Glucose, capillary     Status: None   Collection Time: 08/04/16  4:54 PM  Result Value Ref Range   Glucose-Capillary 98 65 - 99 mg/dL   Comment 1 Notify RN    Comment 2 Document in Chart   Glucose, capillary     Status: Abnormal   Collection Time: 08/05/16  5:53 AM  Result Value Ref Range   Glucose-Capillary 123 (H) 65 - 99 mg/dL   Comment 1 Notify RN    Comment 2 Document in Chart   Glucose, capillary     Status: Abnormal   Collection Time: 08/05/16 11:40 AM  Result Value Ref Range   Glucose-Capillary 252 (H) 65 - 99 mg/dL  Basic metabolic panel     Status: Abnormal   Collection Time: 08/05/16  2:56 PM  Result Value Ref Range   Sodium 142 135 - 145 mmol/L   Potassium 4.1 3.5 - 5.1 mmol/L   Chloride 109 101 - 111 mmol/L   CO2 23 22 - 32 mmol/L   Glucose, Bld 122 (H) 65 - 99 mg/dL   BUN 25 (H) 6 - 20 mg/dL   Creatinine, Ser 1.35 (H) 0.61 - 1.24 mg/dL   Calcium 8.9 8.9 - 10.3 mg/dL   GFR calc non Af Amer 43 (L) >60 mL/min   GFR calc Af Amer 50 (L) >60 mL/min    Comment: (NOTE) The eGFR has been calculated using the CKD EPI equation. This calculation has not been validated in all clinical  situations. eGFR's persistently <60 mL/min signify possible Chronic Kidney Disease.    Anion gap 10 5 - 15  CBC     Status: Abnormal  Collection Time: 08/05/16  2:56 PM  Result Value Ref Range   WBC 11.3 (H) 4.0 - 10.5 K/uL   RBC 3.89 (L) 4.22 - 5.81 MIL/uL   Hemoglobin 12.2 (L) 13.0 - 17.0 g/dL   HCT 37.3 (L) 39.0 - 52.0 %   MCV 95.9 78.0 - 100.0 fL   MCH 31.4 26.0 - 34.0 pg   MCHC 32.7 30.0 - 36.0 g/dL   RDW 13.3 11.5 - 15.5 %   Platelets 189 150 - 400 K/uL     Lipid Panel     Component Value Date/Time   CHOL 158 08/01/2016 0526   TRIG 134 08/01/2016 0526   HDL 41 08/01/2016 0526   CHOLHDL 3.9 08/01/2016 0526   VLDL 27 08/01/2016 0526   LDLCALC 90 08/01/2016 0526     Lab Results  Component Value Date   HGBA1C 7.2 (H) 08/01/2016   HGBA1C 6.4 (H) 06/23/2015   HGBA1C 7.3 (H) 02/07/2015       HPI :  Clayton [name] is a 83 y.o. male with history of T2DM, CKD,  PAF, BPH, syncopal episodes, visual deficits; who was admitted on 07/31/16 with difficulty speaking. MRI brain done revealing acute multifocal left frontal lobe infarcts with faint petechial hemorrhage. CTA head/neck done revealing advanced aortic atherosclerosis, 50% proximal R-ICA and 30% proximal L-ICA stenosis. 2 D echo done today. Dr. Leonel Ramsay recommends ASA for secondary stroke prevention.  MBS with severe dysphagia due to significant oral and pharyngeal sensorimotor weakness and NPO recommended. PT evaluation revealed flexed posture with decreased balance and requires assistance with mobility. CIR recommended for follow up therapy.  Patient ambulated with walker at home. He lives in an assisted living facility with his wife.  HOSPITAL COURSE:   Acute ischemic stroke MRI Acute multifocal small LEFT frontal lobe infarcts (MCA territory) CT head and neck- No emergent large vessel occlusion Continue aspirin, stroke team following and will reassess need for antiplatelet rx  Hemoglobin A1c 7.2 LDL 90, HDL  41, triglycerides 134 PT/OT-CIR/speech therapy  Dysphagia 1 (puree);Honey-thick liquid;Thin liquid (thin water only) 2-D echo EF 55-60% Patient has a history of paroxysmal  not on anticoagulation at home Recommend 30 day cardiac event monitoring as outpt to rule out afib, requested [facility] to arrange 3/30 at 10:30 am  ASA '325mg'$  and plavix '75mg'$  for 3 months and then plavix alone.  Pt will follow up with Cecille Rubin [facility] in about 6 weeks Aspiration precautions and fall precautions at all times  Patient needs continued [place] [facility] Patient will need 24 /7 care, encouragement for feeding/drinking, given his advanced age-calorie count prior to discharge to ensure that the patient is meeting his daily requirements Did offer NG tube placement for tube feeding, if family was very concerned about dehydration. They do not want NG tube at this point and would like to work with speech therapy to see how he can advance his diet. Nutrition will need to follow patient to assess nutritional intake    Hypertension Increased norvasc to 10 mg /day  Paroxysmal AFib -not on any anticoagulation therapy Patient will be placed on regular dose aspirin and plavix per neuro Neurology does not recommend anticoagulation at this time  DM type II - most recent HgbA1C is 6.4% on 06/22/16  Continue metformin and renal function remained stable Consider discontinuation of metformin if creatinine greater than 1.4 Have started this patient on low dose glipizide   UTI Urine culture shows enterococcus, antibiotic changed from Rocephin to amoxicillin  CKD stage III-baseline around 1.4-1.5, creatinine stable at this time, improved now down to 1.2  Discharge Exam:  Blood pressure (!) 150/81, pulse 74, temperature 97.7 F (36.5 C), temperature source Oral, resp. rate 16, height '5\' 11"'$  (1.803 m), weight 63.9 kg (140 lb 14 oz), SpO2 96 %.  General exam: Appears calm and comfortable   Respiratory system: Clear to auscultation. Respiratory effort normal. Cardiovascular system: S1 & S2 heard, RRR. No JVD, murmurs, rubs, gallops or clicks. No pedal edema. Gastrointestinal system: Abdomen is nondistended, soft and nontender. No organomegaly or masses felt. Normal bowel sounds heard. Central nervous system: Awake,expressive dysarthria Extremities: Symmetric 5 x 5 power. Skin: No rashes, lesions or ulcers Psychiatry: Judgement impaired.      Follow-up Information    Dennie Bible, NP. Schedule an appointment as soon as possible for a visit in 6 week(s).   Specialty:  Family Medicine Contact information: [address] [place] [address] [phone]        Precious Reel, MD. Call.   Specialty:  Internal Medicine Why:  Hospital follow-up in 3-5 days Contact information: [address] [place] [address] [phone]           Signed: Reyne Dumas 08/05/2016, 4:04 PM        Time spent >45 mins

## 2016-08-05 NOTE — Progress Notes (Signed)
qPhysical Therapy Treatment Patient Details Name: Clayton [name] MRN: [id number] DOB: [birth year] Today's Date: 08/05/2016    History of Present Illness 83 year old man PMH paroxysmal atrial fibrillation, chronic slurred speech, swallowing difficulty, chronic kidney disease stage III, ICD, diabetes mellitus, admitted due to new onset aphasia.  Head CT early left parietal CVA.    PT Comments    Pt is progressing toward  PT goals but shows limitations in safety awareness and overall strength.  He requires min assist for transfers and ambulation for safe use of the rollator.  He will benefit from continued rehab services at the acute level, as well as upon discharge at the venue listed below.  Follow Up Recommendations  SNF     Equipment Recommendations  None recommended by PT    Recommendations for Other Services Rehab consult     Precautions / Restrictions Precautions Precautions: Fall Restrictions Weight Bearing Restrictions: No    Mobility  Bed Mobility               General bed mobility comments: Pt OOB in chair at start of session.  Transfers Overall transfer level: Needs assistance Equipment used: 4-wheeled walker Transfers: Sit to/from Stand Sit to Stand: Min assist         General transfer comment: Pt slightly impulsive and started to stand up before instructed. VC's for hand placement and min physical assist for powerup needed.  VC's for hand placement and safe descent into chair.  Ambulation/Gait Ambulation/Gait assistance: Min assist Ambulation Distance (Feet): 180 Feet Assistive device: 4-wheeled walker Gait Pattern/deviations: Step-through pattern;Shuffle;Trunk flexed;Decreased stride length Gait velocity: decreased Gait velocity interpretation: Below normal speed for age/gender General Gait Details: Needed frequent verbal and physical cueing for walker proximity and posture.  Wore house shoes which scuffed the floor with each step.  Needed a  standing break and appeared fatigued, but denied when asked.  Vision was tested and he was unable to see a person ~100 ft away, but could see and identify a bed ~ 20 feet away.   Stairs            Wheelchair Mobility    Modified Rankin (Stroke Patients Only) Modified Rankin (Stroke Patients Only) Pre-Morbid Rankin Score: Slight disability Modified Rankin: Moderately severe disability     Balance Overall balance assessment: Needs assistance Sitting-balance support: Bilateral upper extremity supported Sitting balance-Leahy Scale: Fair     Standing balance support: Bilateral upper extremity supported Standing balance-Leahy Scale: Poor Standing balance comment: flexed posture, anterior lean with ambulation and min A for safety                            Cognition Arousal/Alertness: Awake/alert Behavior During Therapy: Flat affect;WFL for tasks assessed/performed Overall Cognitive Status: Difficult to assess                                 General Comments: Pt speech was difficult to interpret.  Better with yes/no questions.      Exercises General Exercises - Lower Extremity Hip Flexion/Marching: Both Toe Raises: 15 reps;Seated    General Comments General comments (skin integrity, edema, etc.): Pt had increased salivation at start of session.  He had a very flat affect which made it difficult to assess fatigue and cognitive status.        Pertinent Vitals/Pain Pain Assessment: No/denies pain Faces Pain Scale: No hurt Pain Intervention(s):  Monitored during session    Home Living                      Prior Function            PT Goals (current goals can now be found in the care plan section) Acute Rehab PT Goals Patient Stated Goal: To return to ILF PT Goal Formulation: With patient/family Time For Goal Achievement: 08/15/16 Potential to Achieve Goals: Fair Progress towards PT goals: Progressing toward goals     Frequency    Min 4X/week      PT Plan Current plan remains appropriate    Co-evaluation             End of Session Equipment Utilized During Treatment: Gait belt Activity Tolerance: Patient tolerated treatment well Patient left: in chair;with call bell/phone within reach;with family/visitor present Nurse Communication: Mobility status PT Visit Diagnosis: Unsteadiness on feet (R26.81);Other abnormalities of gait and mobility (R26.89);Muscle weakness (generalized) (M62.81)     Time: 1610-9604 PT Time Calculation (min) (ACUTE ONLY): 20 min  Charges:                       G Codes:       Willaim Rayas SPT   Willaim Rayas 08/05/2016, 12:16 PM

## 2016-08-05 NOTE — [place] FL2 (Signed)
[place] MEDICAID FL2 LEVEL OF CARE SCREENING TOOL     IDENTIFICATION  Patient Name: Clayton [name] Birthdate: 1919/12/08 Sex: male Admission Date (Current Location): 07/31/2016  [facility] and [place] Number:  Producer, television/film/video and Address:  [facility], [address], [place], [place] [address]      Provider Number: [id number]  Attending Physician Name and Address:  Richarda Overlie, MD  Relative Name and Phone Number:       Current Level of Care: Hospital Recommended Level of Care: Skilled Nursing Facility Prior Approval Number:    Date Approved/Denied:   PASRR Number: [id number]  Discharge Plan: SNF    Current Diagnoses: Patient Active Problem List   Diagnosis Date Noted  . Aphasia   . Aortic atherosclerosis (HCC)   . Stroke (HCC) 07/31/2016  . CKD (chronic kidney disease), stage III 07/31/2016  . Urinary retention 06/30/2015  . AKI (acute kidney injury) (HCC) 05/22/2015  . Acute renal failure superimposed on stage 3 chronic kidney disease (HCC) 02/07/2015  . Syncope 02/07/2015  . Normocytic anemia 02/07/2015  . Hyperkalemia 02/07/2015  . Diabetes mellitus without complication (HCC)   . paroxysmal afib   . Absolute anemia   . Faintness   . Absent pedal pulses 12/28/2014  . Essential hypertension, benign 06/27/2012  . Proximal humerus fracture, right 06/24/2012    Orientation RESPIRATION BLADDER Height & Weight     Self, Place  Normal Incontinent Weight: 140 lb 14 oz (63.9 kg) Height:   (180.3 cm)  BEHAVIORAL SYMPTOMS/MOOD NEUROLOGICAL BOWEL NUTRITION STATUS      Continent  (Please see d/c summary)  AMBULATORY STATUS COMMUNICATION OF NEEDS Skin   Extensive Assist Verbally Normal                       Personal Care Assistance Level of Assistance  Bathing, Feeding, Dressing Bathing Assistance: Maximum assistance Feeding assistance: Limited assistance Dressing Assistance: Maximum assistance     Functional  Limitations Info  Sight, Hearing, Speech Sight Info: Adequate Hearing Info: Adequate Speech Info: Adequate    SPECIAL CARE FACTORS FREQUENCY  Speech therapy     PT Frequency: 4x OT Frequency: 4x     Speech Therapy Frequency: 2/wk      Contractures Contractures Info: Not present    Additional Factors Info  Code Status, Allergies Code Status Info: DNR Allergies Info: Sulfa Antibiotics           Current Medications (08/05/2016):  This is the current hospital active medication list Current Facility-Administered Medications  Medication Dose Route Frequency Provider Last Rate Last Dose  .  stroke: mapping our early stages of recovery book   Does not apply Once Isaiah Blakes, PA-C      . 0.9 %  sodium chloride infusion   Intravenous Continuous Richarda Overlie, MD 50 mL/hr at 08/05/16 0700    . acetaminophen (TYLENOL) tablet 650 mg  650 mg Oral Q6H PRN Isaiah Blakes, PA-C       Or  . acetaminophen (TYLENOL) suppository 650 mg  650 mg Rectal Q6H PRN Isaiah Blakes, PA-C      . amoxicillin (AMOXIL) 250 MG/5ML suspension 500 mg  500 mg Oral Q12H Richarda Overlie, MD      . aspirin tablet 325 mg  325 mg Oral Daily Richarda Overlie, MD   325 mg at 08/05/16 0932  . atorvastatin (LIPITOR) tablet 20 mg  20 mg Oral q1800 Richarda Overlie, MD  20 mg at 08/04/16 1745  . clopidogrel (PLAVIX) tablet 75 mg  75 mg Oral Daily Richarda Overlie, MD   75 mg at 08/05/16 0932  . feeding supplement (ENSURE ENLIVE) (ENSURE ENLIVE) liquid 237 mL  237 mL Oral BID BM Richarda Overlie, MD   237 mL at 08/05/16 1400  . glipiZIDE (GLUCOTROL) tablet 2.5 mg  2.5 mg Oral QAC breakfast Richarda Overlie, MD   2.5 mg at 08/05/16 1500  . heparin injection 5,000 Units  5,000 Units Subcutaneous [address] [place], PA-C   5,000 Units at 08/05/16 1255  . insulin aspart (novoLOG) injection 0-9 Units  0-9 Units Subcutaneous TID WC Isaiah Blakes, PA-C   5 Units at 08/05/16 1245  . ondansetron (ZOFRAN) tablet 4 mg  4 mg Oral Q6H  PRN Isaiah Blakes, PA-C       Or  . ondansetron [facility]) injection 4 mg  4 mg Intravenous Q6H PRN Isaiah Blakes, PA-C      . polyethylene glycol (MIRALAX / GLYCOLAX) packet 17 g  17 g Oral Daily PRN Isaiah Blakes, PA-C      . RESOURCE THICKENUP CLEAR   Oral PRN Richarda Overlie, MD         Discharge Medications: Please see discharge summary for a list of discharge medications.  Relevant Imaging Results:  Relevant Lab Results:   Additional Information SSN: [id number]  [place], [place]

## 2016-08-07 ENCOUNTER — Non-Acute Institutional Stay (SKILLED_NURSING_FACILITY): Payer: Medicare (Managed Care) | Admitting: Internal Medicine

## 2016-08-07 ENCOUNTER — Encounter: Payer: Self-pay | Admitting: Internal Medicine

## 2016-08-07 DIAGNOSIS — N183 Chronic kidney disease, stage 3 unspecified: Secondary | ICD-10-CM

## 2016-08-07 DIAGNOSIS — R4701 Aphasia: Secondary | ICD-10-CM

## 2016-08-07 DIAGNOSIS — I63312 Cerebral infarction due to thrombosis of left middle cerebral artery: Secondary | ICD-10-CM | POA: Diagnosis not present

## 2016-08-07 DIAGNOSIS — I10 Essential (primary) hypertension: Secondary | ICD-10-CM

## 2016-08-07 NOTE — Progress Notes (Signed)
Provider:  Einar Crow Location:   [facility] Room Number: 111/A Place of Service:  SNF (31)  PCP: Gwen Pounds, MD Patient Care Team: Creola Corn, MD as PCP - General (Internal Medicine)  Extended Emergency Contact Information Primary Emergency Contact: Morris,Laurie          [place] [address] [place] Home Phone: [phone] Mobile Phone: [phone] Relation: Daughter  Code Status: Full Code Goals of Care: Advanced Directive information Advanced Directives 08/07/2016  Does Patient Have a Medical Advance Directive? Yes  Type of Advance Directive (No Data)  Does patient want to make changes to medical advance directive? No - Patient declined  Copy of Healthcare Power of Attorney in Chart? -  Would patient like information on creating a medical advance directive? -  Pre-existing out of facility DNR order (yellow form or pink MOST form) -      Chief Complaint  Patient presents with  . New Admit To SNF    HPI: Patient is a 83 y.o. male seen today for admission to SNF for therapy. Patient has H/O Diabetes Mellitus type 2 , Chronic Renal Disease,Legally Blind due to Macular degeneration, HTN, BPH s/pTURP, UTI, Paroxysmal Atrial fibrillation per family they are not sure of this history. Patient has never been on any coagulation.  He was living with his wife in independent apartment when she noticed that he couldn't talk and couldn't swallow. He was evaluated with MRI which showed Acute small L frontal lobe (MCA) infarcts with petechial hemorrhage. Old R caudate lacune.  CTA of head shows R ICA 50%, L ICA 30%. 2D echo was negative for any vegetations EF of 55%. Patient is discharged to SNF for rehab.Per family he is able to walk with the walker with therapist. His only problem is severe Expressive Aphasia and Dysarthria. Also Dysphagia which seems was present before the acute stroke. Patient was unable to give me any history as he is  legally blind, Hard of hearing and has Aphasia now. But he would follow all commands. And seem comfortable. Per family their focus is to take him back to the aprtment with his wife. Past Medical History:  Diagnosis Date  . Anemia   . Cataracts, bilateral   . CKD (chronic kidney disease), stage III   . Diabetes mellitus without complication (HCC)   . Distended bladder   . Diverticulosis    on scan  . Hard of hearing   . Hiatal hernia   . Hypertension   . Kidney stones   . Macular degeneration   . paroxysmal afib    right bundle branch block  . Syncopal episodes    Past Surgical History:  Procedure Laterality Date  . EYE SURGERY     cataracts with lens implants  . HERNIA REPAIR    . KIDNEY STONE SURGERY     since young age  . ORIF HUMERUS FRACTURE Right 06/26/2012   Procedure: OPEN REDUCTION INTERNAL FIXATION (ORIF) PROXIMAL HUMERUS FRACTURE, right ;  Surgeon: Budd Palmer, MD;  Location: [facility] OR;  Service: Orthopedics;  Laterality: Right;  . TRANSURETHRAL RESECTION OF PROSTATE N/A 06/30/2015   Procedure: TRANSURETHRAL RESECTION OF THE PROSTATE (TURP);  Surgeon: Crist Fat, MD;  Location: [facility] ORS;  Service: Urology;  Laterality: N/A;    reports that he quit smoking about 47 years ago. His smoking use included Cigars. He has never used smokeless tobacco. He reports that he does not drink alcohol or use drugs. Social History  Social History  . Marital status: Married    Spouse name: N/A  . Number of children: 3  . Years of education: N/A   Occupational History  . retired    Social History Main Topics  . Smoking status: Former Smoker    Types: Cigars    Quit date: 05/06/1969  . Smokeless tobacco: Never Used  . Alcohol use No  . Drug use: No  . Sexual activity: Not on file   Other Topics Concern  . Not on file   Social History Narrative  . No narrative on file    Functional Status Survey:    Family History  Problem Relation Age of Onset  . Aneurysm  Father     Aortic aneurysm  . Macular degeneration Mother   . Gallbladder disease Mother   . Pancreatitis Sister   . Macular degeneration Sister     x 4    Health Maintenance  Topic Date Due  . FOOT EXAM  [birth year]31  . OPHTHALMOLOGY EXAM  11[birth year]31  . URINE MICROALBUMIN  11/21/1931  . TETANUS/TDAP  11/20/1940  . PNA vac Low Risk Adult (1 of 2 - PCV13) 04/05/2017 (Originally 11/21/1986)  . INFLUENZA VACCINE  12/04/2016  . HEMOGLOBIN A1C  02/01/2017    Allergies  Allergen Reactions  . Sulfa Antibiotics Itching    Allergies as of 08/07/2016      Reactions   Sulfa Antibiotics Itching      Medication List       Accurate as of 08/07/16 10:22 AM. Always use your most recent med list.          alendronate 70 MG tablet Commonly known as:  FOSAMAX Take 70 mg by mouth once a week. Take with a full glass of water on an empty stomach. ( Sunday )   amLODipine 10 MG tablet Commonly known as:  NORVASC Take 10 mg by mouth daily.   amoxicillin 250 MG/5ML suspension Commonly known as:  AMOXIL Take 10 mLs (500 mg total) by mouth every 12 (twelve) hours.   aspirin 325 MG tablet Take 1 tablet (325 mg total) by mouth daily.   atorvastatin 20 MG tablet Commonly known as:  LIPITOR Take 1 tablet (20 mg total) by mouth daily at 6 PM.   clopidogrel 75 MG tablet Commonly known as:  PLAVIX Take 1 tablet (75 mg total) by mouth daily.   feeding supplement (ENSURE ENLIVE) Liqd Take 237 mLs by mouth 2 (two) times daily between meals.   glipiZIDE 5 MG tablet Commonly known as:  GLUCOTROL Take 0.5 tablets (2.5 mg total) by mouth daily before breakfast.   insulin aspart 100 UNIT/ML injection Commonly known as:  NOVOLOG Correction coverage: Sensitive (thin, NPO, renal)  CBG < 70: implement hypoglycemia protocol  CBG 70 - 120: 0 units  CBG 121 - 150: 1 unit  CBG 151 - 200: 2 units  CBG 201 - 250: 3 units  CBG 251 - 300: 5 units  CBG 301 - 350: 7 units  CBG 351 - 400 9 units  CBG > 400 call  MD and obtain STAT lab verification   metFORMIN 500 MG 24 hr tablet Commonly known as:  GLUCOPHAGE-XR Take 500 mg by mouth daily with breakfast.   polyethylene glycol packet Commonly known as:  MIRALAX / GLYCOLAX Take 17 g by mouth daily as needed for mild constipation.       Review of Systems  Unable to perform ROS: Other    Vitals:  08/07/16 1000  BP: 138/78  Pulse: 73  Resp: 18  Temp: 97 F (36.1 C)  TempSrc: Oral  SpO2: 95%   There is no height or weight on file to calculate BMI. Physical Exam  Constitutional: He appears well-developed and well-nourished.  HENT:  Head: Normocephalic.  Mouth/Throat: Oropharynx is clear and moist.  Eyes: Pupils are equal, round, and reactive to light.  Neck: Neck supple.  Cardiovascular: Normal rate, regular rhythm and normal heart sounds.   No murmur heard. Pulmonary/Chest: Effort normal and breath sounds normal. No respiratory distress. He has no wheezes. He has no rales.  Abdominal: Soft. Bowel sounds are normal. He exhibits no distension. There is no tenderness. There is no rebound.  Musculoskeletal: He exhibits no edema.  Neurological: He is alert.  Could not check for Orientation as patient aphasic. Was able to follow commands. No focal deficit.   Skin: Skin is warm and dry. No rash noted. No erythema.  Psychiatric: He has a normal mood and affect. His behavior is normal. Thought content normal.    Labs reviewed: Basic Metabolic Panel:  Recent Labs  16/10/96 0411 08/04/16 1005 08/05/16 1456  NA 140 139 142  K 4.0 4.0 4.1  CL 107 110 109  CO2 GLUCOSE 116* 211* 122*  BUN 19 19 25*  CREATININE 1.22 1.29* 1.35*  CALCIUM 8.6* 8.5* 8.9   Liver Function Tests:  Recent Labs  05/25/16 1726 07/31/16 0952 08/03/16 0411  AST ALT 16* 11* 12*  ALKPHOS 120 78 67  BILITOT 0.7 0.7 0.6  PROT 7.2 6.9 6.4*  ALBUMIN 4.1 3.8 3.3*   No results for input(s): LIPASE, AMYLASE in the last 8760  hours. No results for input(s): AMMONIA in the last 8760 hours. CBC:  Recent Labs  05/25/16 1726 07/31/16 0952  08/01/16 0526 08/03/16 0411 08/05/16 1456  WBC 21.7* 9.2  --  9.6 11.0* 11.3*  NEUTROABS 19.7* 6.6  --   --   --   --   HGB 12.6* 13.3  < > 12.5* 12.4* 12.2*  HCT 38.2* 40.5  < > 38.2* 37.3* 37.3*  MCV 96.0 96.2  --  96.5 95.2 95.9  PLT 222 211  --  219 189 189  < > = values in this interval not displayed. Cardiac Enzymes: No results for input(s): CKTOTAL, CKMB, CKMBINDEX, TROPONINI in the last 8760 hours. BNP: Invalid input(s): POCBNP Lab Results  Component Value Date   HGBA1C 7.2 (H) 08/01/2016   No results found for: TSH Lab Results  Component Value Date   VITAMINB12 354 05/11/2015   Lab Results  Component Value Date   FOLATE 26.1 02/07/2015   Lab Results  Component Value Date   IRON 24 (L) 02/07/2015   TIBC 435 02/07/2015   FERRITIN 10.0 (L) 05/11/2015    Imaging and Procedures obtained prior to SNF admission: Ct Angio Head W Or Wo Contrast  Result Date: 07/31/2016 CLINICAL DATA:  Aphasia.  Right arm and leg drift. EXAM: CT ANGIOGRAPHY HEAD AND NECK TECHNIQUE: Multidetector CT imaging of the head and neck was performed using the standard protocol during bolus administration of intravenous contrast. Multiplanar CT image reconstructions and MIPs were obtained to evaluate the vascular anatomy. Carotid stenosis measurements (when applicable) are obtained utilizing NASCET criteria, using the distal internal carotid diameter as the denominator. CONTRAST:  50 mL Isovue 370 COMPARISON:  Head CT 07/31/2016 and MRI 02/07/2015. No prior angiographic imaging. FINDINGS: CTA NECK FINDINGS Aortic  arch: Standard 3 vessel aortic arch with heavy calcified and irregular soft plaque projecting into the lumen. Non-stenotic plaque in the brachiocephalic and right subclavian arteries. Mild narrowing of the proximal left subclavian artery due to soft and calcified plaque. Right  carotid system: Moderate soft and calcified plaque in the mid to distal the common carotid artery without significant stenosis. Extensive, heavily calcified plaque at the bifurcation and carotid bulb results in approximately 50% proximal ICA stenosis. Left carotid system: Mild-to-moderate calcified and soft plaque in the mid to distal common carotid artery without significant stenosis. Eccentric, calcified plaque in the proximal ICA results in 30% stenosis. There is moderate proximal ECA stenosis due to soft plaque. Vertebral arteries: Patent with the left being slightly larger than the right. No evidence of dissection or significant stenosis. Slight irregularity of the V3 segments bilaterally for which mild fibromuscular dysplasia is not excluded. Skeleton: Multilevel cervical disc and facet degeneration. Interbody and posterior element ankylosis at C3-4, C4-5, and C6-7. Other neck: No mass or lymph node enlargement. Upper chest: Partially visualized mosaic attenuation in the left upper lobe, possibly air trapping. Review of the MIP images confirms the above findings CTA HEAD FINDINGS Anterior circulation: The internal carotid arteries are patent from skullbase to carotid termini with diffuse siphon atherosclerosis bilaterally. There is mild cavernous and supraclinoid segment stenosis on the left. The MCAs are patent without evidence of major branch occlusion or significant proximal stenosis. There is an early MCA bifurcation on the left. There is mild asymmetric attenuation of distal left MCA branch vessels. The ACAs are patent without evidence of major branch occlusion or significant proximal stenosis. The left A1 segment is hypoplastic. No aneurysm. Posterior circulation: The intracranial vertebral arteries are patent with mild non stenotic calcified plaque in the left V4 segment. Patent PICA, AICA, and SCA origins are visualized bilaterally. The basilar artery is patent without stenosis. There is a prominent  right posterior communicating artery with markedly hypoplastic or absent/ occluded right P1 segment. No significant PCA stenosis is identified. No aneurysm. Venous sinuses: Not well evaluated due to contrast timing. Anatomic variants: Fetal origin of the right PCA. Hypoplastic left A1. Delayed phase: Not performed. Review of the MIP images confirms the above findings IMPRESSION: 1. No emergent large vessel occlusion. 2. Advanced aortic atherosclerosis. 3. 50% proximal right ICA stenosis. 4. 30% proximal left ICA stenosis. 5. Mild intracranial left ICA stenosis. Preliminary findings of no large vessel occlusion were called by telephone at the time of interpretation on 07/31/2016 at 10:45 am to Dr. Amada Jupiter, who verbally acknowledged these results. Electronically Signed   By: Sebastian Ache M.D.   On: 07/31/2016 11:09   Ct Angio Neck W Or Wo Contrast  Result Date: 07/31/2016 CLINICAL DATA:  Aphasia.  Right arm and leg drift. EXAM: CT ANGIOGRAPHY HEAD AND NECK TECHNIQUE: Multidetector CT imaging of the head and neck was performed using the standard protocol during bolus administration of intravenous contrast. Multiplanar CT image reconstructions and MIPs were obtained to evaluate the vascular anatomy. Carotid stenosis measurements (when applicable) are obtained utilizing NASCET criteria, using the distal internal carotid diameter as the denominator. CONTRAST:  50 mL Isovue 370 COMPARISON:  Head CT 07/31/2016 and MRI 02/07/2015. No prior angiographic imaging. FINDINGS: CTA NECK FINDINGS Aortic arch: Standard 3 vessel aortic arch with heavy calcified and irregular soft plaque projecting into the lumen. Non-stenotic plaque in the brachiocephalic and right subclavian arteries. Mild narrowing of the proximal left subclavian artery due to soft and calcified plaque. Right  carotid system: Moderate soft and calcified plaque in the mid to distal the common carotid artery without significant stenosis. Extensive, heavily  calcified plaque at the bifurcation and carotid bulb results in approximately 50% proximal ICA stenosis. Left carotid system: Mild-to-moderate calcified and soft plaque in the mid to distal common carotid artery without significant stenosis. Eccentric, calcified plaque in the proximal ICA results in 30% stenosis. There is moderate proximal ECA stenosis due to soft plaque. Vertebral arteries: Patent with the left being slightly larger than the right. No evidence of dissection or significant stenosis. Slight irregularity of the V3 segments bilaterally for which mild fibromuscular dysplasia is not excluded. Skeleton: Multilevel cervical disc and facet degeneration. Interbody and posterior element ankylosis at C3-4, C4-5, and C6-7. Other neck: No mass or lymph node enlargement. Upper chest: Partially visualized mosaic attenuation in the left upper lobe, possibly air trapping. Review of the MIP images confirms the above findings CTA HEAD FINDINGS Anterior circulation: The internal carotid arteries are patent from skullbase to carotid termini with diffuse siphon atherosclerosis bilaterally. There is mild cavernous and supraclinoid segment stenosis on the left. The MCAs are patent without evidence of major branch occlusion or significant proximal stenosis. There is an early MCA bifurcation on the left. There is mild asymmetric attenuation of distal left MCA branch vessels. The ACAs are patent without evidence of major branch occlusion or significant proximal stenosis. The left A1 segment is hypoplastic. No aneurysm. Posterior circulation: The intracranial vertebral arteries are patent with mild non stenotic calcified plaque in the left V4 segment. Patent PICA, AICA, and SCA origins are visualized bilaterally. The basilar artery is patent without stenosis. There is a prominent right posterior communicating artery with markedly hypoplastic or absent/ occluded right P1 segment. No significant PCA stenosis is identified. No  aneurysm. Venous sinuses: Not well evaluated due to contrast timing. Anatomic variants: Fetal origin of the right PCA. Hypoplastic left A1. Delayed phase: Not performed. Review of the MIP images confirms the above findings IMPRESSION: 1. No emergent large vessel occlusion. 2. Advanced aortic atherosclerosis. 3. 50% proximal right ICA stenosis. 4. 30% proximal left ICA stenosis. 5. Mild intracranial left ICA stenosis. Preliminary findings of no large vessel occlusion were called by telephone at the time of interpretation on 07/31/2016 at 10:45 am to Dr. Amada Jupiter, who verbally acknowledged these results. Electronically Signed   By: Sebastian Ache M.D.   On: 07/31/2016 11:09   Mr Brain Wo Contrast  Result Date: 07/31/2016 CLINICAL DATA:  Slurred speech and RIGHT-sided weakness, last seen normal last night. History of diabetes, hypertension. EXAM: MRI HEAD WITHOUT CONTRAST TECHNIQUE: Multiplanar, multiecho pulse sequences of the brain and surrounding structures were obtained without intravenous contrast. COMPARISON:  CTA HEAD and neck July 31, 2016 at 1034 hours. FINDINGS: BRAIN: Faint patchy reduced diffusion LEFT frontal lobe including the operculum. Corresponding low ADC values with faint susceptibility artifact LEFT frontal cortex. Moderate to severe ventriculomegaly on the basis of global parenchymal brain volume loss. Confluent supratentorial white matter FLAIR T2 hyperintensities. No midline shift, mass effect or masses. Old RIGHT caudate head lacunar infarct. No abnormal extra-axial fluid collections. VASCULAR: Major intracranial vascular flow voids present at skull base, dolichoectasia associated with chronic hypertension. SKULL AND UPPER CERVICAL SPINE: No abnormal sellar expansion. No suspicious calvarial bone marrow signal. Craniocervical junction maintained. SINUSES/ORBITS: The mastoid air-cells and included paranasal sinuses are well-aerated. The included ocular globes and orbital contents are  non-suspicious. Status post bilateral ocular lens implants. OTHER: None. IMPRESSION: Acute multifocal small LEFT  frontal lobe infarcts (MCA territory) with faint petechial hemorrhage. Chronic changes include old RIGHT caudate head lacunar infarct and moderate chronic small vessel ischemic disease. Electronically Signed   By: Awilda Metro M.D.   On: 07/31/2016 19:57   Ct Head Code Stroke Wo Contrast`  Result Date: 07/31/2016 CLINICAL DATA:  Code stroke.  Aphasia.  Right arm and leg drift. EXAM: CT HEAD WITHOUT CONTRAST TECHNIQUE: Contiguous axial images were obtained from the base of the skull through the vertex without intravenous contrast. COMPARISON:  Brain MRI 02/07/2015 FINDINGS: Brain: There is no evidence of acute intracranial hemorrhage, mass, midline shift, or extra-axial fluid collection. There is age related cerebral atrophy. Patchy to confluent bilateral cerebral white matter hypodensities are nonspecific but compatible with moderate chronic small vessel ischemic disease. There is mildly asymmetric white matter hypoattenuation at the level of the posterior left corona radiata (series 201, image 21) with possible early loss of gray- white differentiation involving the overlying cortex. There is a chronic lacunar infarct in the right caudate nucleus. Vascular: Calcified atherosclerosis at the skullbase. No proximal dense MCA. Slightly increased density of a small MCA branch vessel posteriorly in the left sylvian fissure, which may represent a distal M2/ proximal M3 embolus versus atherosclerosis. Skull: No fracture or focal osseous lesion. Sinuses/Orbits: Bilateral cataract extraction. No significant sinus disease. Other: None. ASPECTS [facility] Stroke Program Early CT Score) - Ganglionic level infarction (caudate, lentiform nuclei, internal capsule, insula, M1-M3 cortex): 7 - Supraganglionic infarction (M4-M6 cortex): 2 Total score (0-10 with 10 being normal): 9 IMPRESSION: 1. No acute intracranial  hemorrhage. 2. Possible early left parietal lobe infarct. 3. ASPECTS is 9. 4. Moderate chronic small vessel ischemic disease. These results were called by telephone at the time of interpretation on 07/31/2016 at 10:30 am to Dr. Amada Jupiter, who verbally acknowledged these results. Electronically Signed   By: Sebastian Ache M.D.   On: 07/31/2016 10:39    Assessment/Plan S/P CVA Leading to Severe Dysarthria and Aphasia. He is on both Plavix and aspirin and per nephrology dual therapy for 3 months and then only Plavix. They have also recommended 30 day cardiac Event Monitoring. For some remote history of PAF.Patient family is not sure how this will help him.  Will order and it can be reviewed with patient's family again. Continue statin with goal of LDL less then 100. LDL is 90. Aggressive speech therapy. Follow aspiration Precautions. Follow up with Neurology in 6 weeks.,  Aortic Athroma No aggressive treatment Continue Dual treament of r3 months  Hypertension Well controlled. His Amlodipine was increased to 10 mg Diabetes mellitus He was only on Metformin at home. His A1C is 7.2. Glipizide was added in hospital. BS mostly less then 200. UTI Patient was started on Amoxicillin for Enterococcus UTI.  Family/ staff Communication:  D/W Family in details Labs/tests ordered: CBC and CMP. Total time spent in this patient care encounter was 45_ minutes; greater than 50% of the visit spent counseling patient and coordinating care for problems addressed at this encounter.

## 2016-08-09 LAB — HEPATIC FUNCTION PANEL
ALT: 15 U/L (ref 10–40)
AST: 17 U/L (ref 14–40)
Alkaline Phosphatase: 82 U/L (ref 25–125)
Bilirubin, Total: 0.5 mg/dL

## 2016-08-09 LAB — BASIC METABOLIC PANEL
BUN: 33 mg/dL — AB (ref 4–21)
CREATININE: 1.3 mg/dL (ref 0.6–1.3)
Glucose: 181 mg/dL
POTASSIUM: 4.5 mmol/L (ref 3.4–5.3)
SODIUM: 148 mmol/L — AB (ref 137–147)

## 2016-08-09 LAB — CBC AND DIFFERENTIAL
HCT: 46 % (ref 41–53)
Hemoglobin: 15 g/dL (ref 13.5–17.5)
Neutrophils Absolute: 8 /uL
Platelets: 225 10*3/uL (ref 150–399)
WBC: 11.4 10^3/mL

## 2016-08-13 ENCOUNTER — Encounter: Payer: Self-pay | Admitting: Adult Health

## 2016-08-13 ENCOUNTER — Non-Acute Institutional Stay (SKILLED_NURSING_FACILITY): Payer: Medicare Other | Admitting: Adult Health

## 2016-08-13 DIAGNOSIS — I480 Paroxysmal atrial fibrillation: Secondary | ICD-10-CM | POA: Diagnosis not present

## 2016-08-13 DIAGNOSIS — I63312 Cerebral infarction due to thrombosis of left middle cerebral artery: Secondary | ICD-10-CM

## 2016-08-13 DIAGNOSIS — N183 Chronic kidney disease, stage 3 unspecified: Secondary | ICD-10-CM

## 2016-08-13 DIAGNOSIS — R4701 Aphasia: Secondary | ICD-10-CM | POA: Diagnosis not present

## 2016-08-13 DIAGNOSIS — I10 Essential (primary) hypertension: Secondary | ICD-10-CM | POA: Diagnosis not present

## 2016-08-13 NOTE — Progress Notes (Signed)
Location:   [facility] Room Number: 111 A Place of Service:  SNF (31)    CODE STATUS: DNR  Allergies  Allergen Reactions  . Sulfa Antibiotics Itching    Chief Complaint  Patient presents with  . Discharge Note    Discharge to Hospice    HPI:  He is being discharged to home with hospice care. His family has decided that his need will be better met in the home environment. He will not need dme as hospice will provide and will not need any home health. He will need to follow up with his medical provider.     Past Medical History:  Diagnosis Date  . Anemia   . Cataracts, bilateral   . CKD (chronic kidney disease), stage III   . Diabetes mellitus without complication (HCC)   . Distended bladder   . Diverticulosis    on scan  . Hard of hearing   . Hiatal hernia   . Hypertension   . Kidney stones   . Macular degeneration   . paroxysmal afib    right bundle branch block  . Syncopal episodes     Past Surgical History:  Procedure Laterality Date  . EYE SURGERY     cataracts with lens implants  . HERNIA REPAIR    . KIDNEY STONE SURGERY     since young age  . ORIF HUMERUS FRACTURE Right 06/26/2012   Procedure: OPEN REDUCTION INTERNAL FIXATION (ORIF) PROXIMAL HUMERUS FRACTURE, right ;  Surgeon: Budd Palmer, MD;  Location: [facility] OR;  Service: Orthopedics;  Laterality: Right;  . TRANSURETHRAL RESECTION OF PROSTATE N/A 06/30/2015   Procedure: TRANSURETHRAL RESECTION OF THE PROSTATE (TURP);  Surgeon: Crist Fat, MD;  Location: [facility] ORS;  Service: Urology;  Laterality: N/A;    Social History   Social History  . Marital status: Married    Spouse name: N/A  . Number of children: 3  . Years of education: N/A   Occupational History  . retired    Social History Main Topics  . Smoking status: Former Smoker    Types: Cigars    Quit date: 05/06/1969  . Smokeless tobacco: Never Used  . Alcohol use No  . Drug use: No  . Sexual activity: Not on file    Other Topics Concern  . Not on file   Social History Narrative  . No narrative on file   Family History  Problem Relation Age of Onset  . Aneurysm Father     Aortic aneurysm  . Macular degeneration Mother   . Gallbladder disease Mother   . Pancreatitis Sister   . Macular degeneration Sister     x 4    VITAL SIGNS BP 130/74   Pulse 75   Temp 98.3 F (36.8 C)   Resp 20   Ht 5\' 2"  (1.575 m)   Wt 130 lb 12.8 oz (59.3 kg)   BMI 23.92 kg/m   Patient's Medications  New Prescriptions   No medications on file  Previous Medications   ALENDRONATE (FOSAMAX) 70 MG TABLET    Take 70 mg by mouth once a week. Take with a full glass of water on an empty stomach. ( Sunday )   AMLODIPINE (NORVASC) 10 MG TABLET    Take 10 mg by mouth daily.   ASPIRIN 325 MG TABLET    Take 1 tablet (325 mg total) by mouth daily.   ATORVASTATIN (LIPITOR) 20 MG TABLET    Take 1 tablet (20  mg total) by mouth daily at 6 PM.   CLOPIDOGREL (PLAVIX) 75 MG TABLET    Take 1 tablet (75 mg total) by mouth daily.   GLIPIZIDE (GLUCOTROL) 5 MG TABLET    Take 0.5 tablets (2.5 mg total) by mouth daily before breakfast.   INSULIN ASPART (NOVOLOG) 100 UNIT/ML INJECTION    Correction coverage: Sensitive (thin, NPO, renal)  CBG < 70: implement hypoglycemia protocol  CBG 70 - 120: 0 units  CBG 121 - 150: 1 unit  CBG 151 - 200: 2 units  CBG 201 - 250: 3 units  CBG 251 - 300: 5 units  CBG 301 - 350: 7 units  CBG 351 - 400 9 units  CBG > 400 call MD and obtain STAT lab verification   METFORMIN (GLUCOPHAGE-XR) 500 MG 24 HR TABLET    Take 500 mg by mouth daily with breakfast.   POLYETHYLENE GLYCOL (MIRALAX / GLYCOLAX) PACKET    Take 17 g by mouth daily as needed for mild constipation.  Modified Medications   No medications on file  Discontinued Medications   FEEDING SUPPLEMENT, ENSURE ENLIVE, (ENSURE ENLIVE) LIQD    Take 237 mLs by mouth 2 (two) times daily between meals.     SIGNIFICANT DIAGNOSTIC EXAMS   Review of  Systems  Unable to perform ROS: Medical condition    Physical Exam  Constitutional: No distress.  Eyes: Conjunctivae are normal.  Neck: Neck supple. No JVD present. No thyromegaly present.  Cardiovascular: Normal rate, regular rhythm and intact distal pulses.   Respiratory: Effort normal and breath sounds normal. No respiratory distress. He has no wheezes.  GI: Soft. Bowel sounds are normal. He exhibits no distension. There is no tenderness.  Musculoskeletal: He exhibits no edema.  Lymphadenopathy:    He has no cervical adenopathy.  Neurological:  Is aware   Skin: Skin is warm and dry. He is not diaphoretic.  Psychiatric: He has a normal mood and affect.     ASSESSMENT/ PLAN:  Patient is being discharged with the following home health services:  Hospice care   Patient is being discharged with the following durable medical equipment:   None required; hospice will provide  Patient has been advised to f/u with their PCP in 1-2 weeks to bring them up to date on their rehab stay.  Social services at facility was responsible for arranging this appointment.  Pt was provided with a 30 day supply of prescriptions for medications and refills must be obtained from their PCP.  For controlled substances, a more limited supply may be provided adequate until PCP appointment only.  Time spent with patient 40   minutes >50% time spent counseling; reviewing medical record; tests; labs; and developing future plan of care    Ok Edwards NP [facility]  Contact [phone] Monday through Friday 8am- 5pm  After hours call [phone]

## 2016-08-19 DIAGNOSIS — I480 Paroxysmal atrial fibrillation: Secondary | ICD-10-CM | POA: Diagnosis not present

## 2016-08-19 DIAGNOSIS — N183 Chronic kidney disease, stage 3 (moderate): Secondary | ICD-10-CM | POA: Diagnosis not present

## 2016-08-19 DIAGNOSIS — N401 Enlarged prostate with lower urinary tract symptoms: Secondary | ICD-10-CM | POA: Diagnosis not present

## 2016-08-19 DIAGNOSIS — E1122 Type 2 diabetes mellitus with diabetic chronic kidney disease: Secondary | ICD-10-CM | POA: Diagnosis not present

## 2016-08-19 DIAGNOSIS — R2689 Other abnormalities of gait and mobility: Secondary | ICD-10-CM | POA: Diagnosis not present

## 2016-08-19 DIAGNOSIS — I129 Hypertensive chronic kidney disease with stage 1 through stage 4 chronic kidney disease, or unspecified chronic kidney disease: Secondary | ICD-10-CM | POA: Diagnosis not present

## 2016-08-19 DIAGNOSIS — R627 Adult failure to thrive: Secondary | ICD-10-CM | POA: Diagnosis not present

## 2016-08-19 DIAGNOSIS — I639 Cerebral infarction, unspecified: Secondary | ICD-10-CM | POA: Diagnosis not present

## 2016-08-19 DIAGNOSIS — R4701 Aphasia: Secondary | ICD-10-CM | POA: Diagnosis not present

## 2016-09-09 ENCOUNTER — Other Ambulatory Visit: Payer: Self-pay | Admitting: Adult Health

## 2016-09-11 ENCOUNTER — Other Ambulatory Visit: Payer: Self-pay | Admitting: Adult Health

## 2016-09-12 ENCOUNTER — Other Ambulatory Visit: Payer: Self-pay | Admitting: Adult Health

## 2016-09-16 DIAGNOSIS — R300 Dysuria: Secondary | ICD-10-CM | POA: Diagnosis not present

## 2016-09-16 DIAGNOSIS — R358 Other polyuria: Secondary | ICD-10-CM | POA: Diagnosis not present

## 2016-09-23 DIAGNOSIS — I129 Hypertensive chronic kidney disease with stage 1 through stage 4 chronic kidney disease, or unspecified chronic kidney disease: Secondary | ICD-10-CM | POA: Diagnosis not present

## 2016-09-23 DIAGNOSIS — M810 Age-related osteoporosis without current pathological fracture: Secondary | ICD-10-CM | POA: Diagnosis not present

## 2016-09-23 DIAGNOSIS — N183 Chronic kidney disease, stage 3 (moderate): Secondary | ICD-10-CM | POA: Diagnosis not present

## 2016-09-23 DIAGNOSIS — I714 Abdominal aortic aneurysm, without rupture: Secondary | ICD-10-CM | POA: Diagnosis not present

## 2016-09-23 DIAGNOSIS — I2789 Other specified pulmonary heart diseases: Secondary | ICD-10-CM | POA: Diagnosis not present

## 2016-09-23 DIAGNOSIS — R4701 Aphasia: Secondary | ICD-10-CM | POA: Diagnosis not present

## 2016-09-23 DIAGNOSIS — E1122 Type 2 diabetes mellitus with diabetic chronic kidney disease: Secondary | ICD-10-CM | POA: Diagnosis not present

## 2016-09-23 DIAGNOSIS — Z6821 Body mass index (BMI) 21.0-21.9, adult: Secondary | ICD-10-CM | POA: Diagnosis not present

## 2016-09-23 DIAGNOSIS — N390 Urinary tract infection, site not specified: Secondary | ICD-10-CM | POA: Diagnosis not present

## 2016-09-23 DIAGNOSIS — R627 Adult failure to thrive: Secondary | ICD-10-CM | POA: Diagnosis not present

## 2016-09-23 DIAGNOSIS — I638 Other cerebral infarction: Secondary | ICD-10-CM | POA: Diagnosis not present

## 2016-09-23 DIAGNOSIS — B351 Tinea unguium: Secondary | ICD-10-CM | POA: Diagnosis not present

## 2016-10-02 ENCOUNTER — Encounter: Payer: Self-pay | Admitting: Podiatry

## 2016-10-02 ENCOUNTER — Ambulatory Visit (INDEPENDENT_AMBULATORY_CARE_PROVIDER_SITE_OTHER): Payer: Medicare (Managed Care) | Admitting: Podiatry

## 2016-10-02 VITALS — BP 132/63 | HR 56 | Resp 18

## 2016-10-02 DIAGNOSIS — B351 Tinea unguium: Secondary | ICD-10-CM

## 2016-10-02 DIAGNOSIS — I739 Peripheral vascular disease, unspecified: Secondary | ICD-10-CM | POA: Diagnosis not present

## 2016-10-02 NOTE — Patient Instructions (Signed)
Removed Band-Aid on right great toe 1-3 days and apply topical antibiotic ointment and a Band-Aid daily until a scab forms  Diabetes and Foot Care Diabetes may cause you to have problems because of poor blood supply (circulation) to your feet and legs. This may cause the skin on your feet to become thinner, break easier, and heal more slowly. Your skin may become dry, and the skin may peel and crack. You may also have nerve damage in your legs and feet causing decreased feeling in them. You may not notice minor injuries to your feet that could lead to infections or more serious problems. Taking care of your feet is one of the most important things you can do for yourself. Follow these instructions at home:  Wear shoes at all times, even in the house. Do not go barefoot. Bare feet are easily injured.  Check your feet daily for blisters, cuts, and redness. If you cannot see the bottom of your feet, use a mirror or ask someone for help.  Wash your feet with warm water (do not use hot water) and mild soap. Then pat your feet and the areas between your toes until they are completely dry. Do not soak your feet as this can dry your skin.  Apply a moisturizing lotion or petroleum jelly (that does not contain alcohol and is unscented) to the skin on your feet and to dry, brittle toenails. Do not apply lotion between your toes.  Trim your toenails straight across. Do not dig under them or around the cuticle. File the edges of your nails with an emery board or nail file.  Do not cut corns or calluses or try to remove them with medicine.  Wear clean socks or stockings every day. Make sure they are not too tight. Do not wear knee-high stockings since they may decrease blood flow to your legs.  Wear shoes that fit properly and have enough cushioning. To break in new shoes, wear them for just a few hours a day. This prevents you from injuring your feet. Always look in your shoes before you put them on to be  sure there are no objects inside.  Do not cross your legs. This may decrease the blood flow to your feet.  If you find a minor scrape, cut, or break in the skin on your feet, keep it and the skin around it clean and dry. These areas may be cleansed with mild soap and water. Do not cleanse the area with peroxide, alcohol, or iodine.  When you remove an adhesive bandage, be sure not to damage the skin around it.  If you have a wound, look at it several times a day to make sure it is healing.  Do not use heating pads or hot water bottles. They may burn your skin. If you have lost feeling in your feet or legs, you may not know it is happening until it is too late.  Make sure your health care provider performs a complete foot exam at least annually or more often if you have foot problems. Report any cuts, sores, or bruises to your health care provider immediately. Contact a health care provider if:  You have an injury that is not healing.  You have cuts or breaks in the skin.  You have an ingrown nail.  You notice redness on your legs or feet.  You feel burning or tingling in your legs or feet.  You have pain or cramps in your legs and feet.  Your legs or feet are numb.  Your feet always feel cold. Get help right away if:  There is increasing redness, swelling, or pain in or around a wound.  There is a red line that goes up your leg.  Pus is coming from a wound.  You develop a fever or as directed by your health care provider.  You notice a bad smell coming from an ulcer or wound. This information is not intended to replace advice given to you by your health care provider. Make sure you discuss any questions you have with your health care provider. Document Released: 04/19/2000 Document Revised: 09/28/2015 Document Reviewed: 09/29/2012 Elsevier Interactive Patient Education  2017 [employer].

## 2016-10-02 NOTE — Progress Notes (Signed)
Patient ID: Clayton SkyeWilliam G Shults, male   DOB: [birth year], 83 y.o.   MRN: [id number]    Subjective: This patient presents with daughter and treatment room who is requesting debridement of her father's toenails. She is concerned the toenails are thickened and elongated.  Objective: Patient is visually impaired Patient does respond to questioning DP and PT pulses nonpalpable bilaterally Capillary reflex immediate bilaterally Sensation to 10 g monofilament wire intact 4/5 bilaterally Vibratory sensation reactive bilaterally Ankle reflexes reactive bilaterally Patient walks slowly with a roller walker No open skin lesions bilaterally Atrophic skin with absent hair growth bilaterally The toenails are elongated, brittle, deformed, hypertrophic 6-10  Assessment: Diabetic with peripheral arterial disease Absent pedal pulses bilaterally Protective sensation intact bilaterally Mycotic toenails 6-10  Plan: Debridement toenails 6-10 mechanically and electronically slight bleeding distal  Right hallux, treated with topical antibiotic ointment and Band-Aid. Patient instructed removed Band-Aid 1-3 days and continue to apply topical antibiotic ointment and Band-Aid daily until a scab forms  Reappoint 3 months

## 2016-10-24 ENCOUNTER — Ambulatory Visit (INDEPENDENT_AMBULATORY_CARE_PROVIDER_SITE_OTHER): Payer: Medicare (Managed Care) | Admitting: Nurse Practitioner

## 2016-10-24 ENCOUNTER — Encounter: Payer: Self-pay | Admitting: Nurse Practitioner

## 2016-10-24 VITALS — BP 125/70 | HR 64 | Ht 62.0 in | Wt 129.0 lb

## 2016-10-24 DIAGNOSIS — I63312 Cerebral infarction due to thrombosis of left middle cerebral artery: Secondary | ICD-10-CM

## 2016-10-24 DIAGNOSIS — N183 Chronic kidney disease, stage 3 unspecified: Secondary | ICD-10-CM

## 2016-10-24 DIAGNOSIS — I10 Essential (primary) hypertension: Secondary | ICD-10-CM

## 2016-10-24 DIAGNOSIS — R4701 Aphasia: Secondary | ICD-10-CM

## 2016-10-24 DIAGNOSIS — E785 Hyperlipidemia, unspecified: Secondary | ICD-10-CM | POA: Diagnosis not present

## 2016-10-24 MED ORDER — CLOPIDOGREL BISULFATE 75 MG PO TABS: 75.0000 mg | ORAL_TABLET | Freq: Every day | ORAL | 6 refills | Status: DC

## 2016-10-24 MED ORDER — ATORVASTATIN CALCIUM 20 MG PO TABS: 20.0000 mg | ORAL_TABLET | Freq: Every day | ORAL | 6 refills | Status: DC

## 2016-10-24 NOTE — Progress Notes (Signed)
I have read the note, and I agree with the clinical assessment and plan.  Richard A. Sater, MD, PhD, FAAN Certified in Neurology, Clinical Neurophysiology, Sleep Medicine, Pain Medicine and Neuroimaging  [facility] [address], [address] [place], [place] [address] [phone]  

## 2016-10-24 NOTE — Progress Notes (Signed)
[facility]  PATIENT: Clayton [name] DOB: 12-13-1919   REASON FOR VISIT: Hospital follow-up for small left MCA territory infarcts embolic likely secondary to large vessel atherosclerosis HISTORY FROM: Patient, wife and daughter    HISTORY OF PRESENT ILLNESS: FROM RECORDMr. [name] is a 83 year old male with a history of atrial fib not on anticoagulant, hypertension hyperlipidemia and diabetes presenting with aphasia. He did not receive IV TPA due to delay in arrival. MRI small left MCA territory infarcts likely embolic secondary to large vessel atherosclerosis. Patient had remote transient atrial fibrillation in the setting of acute pancreatitis therefore cardiac monitoring to rule out atrial fibrillation and. CTA head and neck enlarged vessel occlusion 2-D echo EF 55-60%. LDL 90. Hemoglobin A1c 7.2 he was placed on aspirin and Plavix for 3 months. Patient has a history of peripheral vascular disease followed  with Dr. Allyson [name]. Interval history 06/21/2018CM patient returns to the stroke clinic for hospital follow-up. He was in skilled nursing care for approximately a week and the family brought him home. He was in hospice care for a while and he was taken off of all of his medications except Norvasc and aspirin. Hospice is no longer involved. He is now getting physical therapy and speech therapy in home. Occupational therapy has concluded. According to the daughter he is eating what he wants to basically a soft diet he has not had any choking spells. He has not had cardiac event monitoring and they do not feel this is necessary. They are willing to place him back on Plavix and Lipitor. He is ambulating with a walker. No recent falls blood pressure in the office today 125/70. He returns for reevaluation   REVIEW OF SYSTEMS: Full 14 system review of systems performed and notable only for those listed, all others are neg:  Constitutional: neg  Cardiovascular: neg Ear/Nose/Throat:  Hearing loss  Skin: neg Eyes: Macular degeneration Respiratory: neg Gastroitestinal: Incontinence of bladder at night Hematology/Lymphatic: neg  Endocrine: neg Musculoskeletal:neg Allergy/Immunology: neg Neurological: Speech difficulty weakness Psychiatric: neg Sleep : neg   ALLERGIES: Allergies  Allergen Reactions  . Sulfa Antibiotics Itching    HOME MEDICATIONS: Outpatient Medications Prior to Visit  Medication Sig Dispense Refill  . amLODipine (NORVASC) 10 MG tablet Take 10 mg by mouth daily.    Marland Kitchen atorvastatin (LIPITOR) 20 MG tablet Take 1 tablet (20 mg total) by mouth daily at 6 PM. 30 tablet 1  . clopidogrel (PLAVIX) 75 MG tablet Take 1 tablet (75 mg total) by mouth daily. 30 tablet 1  . metFORMIN (GLUCOPHAGE-XR) 500 MG 24 hr tablet Take 500 mg by mouth daily with breakfast.    . polyethylene glycol (MIRALAX / GLYCOLAX) packet Take 17 g by mouth daily as needed for mild constipation. 14 each 0  . aspirin 325 MG tablet Take 1 tablet (325 mg total) by mouth daily. 30 tablet 1  . alendronate (FOSAMAX) 70 MG tablet Take 70 mg by mouth once a week. Take with a full glass of water on an empty stomach. ( Sunday )    . glipiZIDE (GLUCOTROL) 5 MG tablet Take 0.5 tablets (2.5 mg total) by mouth daily before breakfast. (Patient not taking: Reported on 10/24/2016) 30 tablet 1  . insulin aspart (NOVOLOG) 100 UNIT/ML injection Correction coverage: Sensitive (thin, NPO, renal)  CBG < 70: implement hypoglycemia protocol  CBG 70 - 120: 0 units  CBG 121 - 150: 1 unit  CBG 151 - 200: 2 units  CBG 201 - 250: 3  units  CBG 251 - 300: 5 units  CBG 301 - 350: 7 units  CBG 351 - 400 9 units  CBG > 400 call MD and obtain STAT lab verification (Patient not taking: Reported on 10/24/2016) 10 mL 11   No facility-administered medications prior to visit.     PAST MEDICAL HISTORY: Past Medical History:  Diagnosis Date  . Anemia   . Cataracts, bilateral   . CKD (chronic kidney disease), stage III    . Diabetes mellitus without complication (HCC)   . Distended bladder   . Diverticulosis    on scan  . Hard of hearing   . Hiatal hernia   . Hypertension   . Kidney stones   . Macular degeneration   . paroxysmal afib    right bundle branch block  . Syncopal episodes     PAST SURGICAL HISTORY: Past Surgical History:  Procedure Laterality Date  . EYE SURGERY     cataracts with lens implants  . HERNIA REPAIR    . KIDNEY STONE SURGERY     since young age  . ORIF HUMERUS FRACTURE Right 06/26/2012   Procedure: OPEN REDUCTION INTERNAL FIXATION (ORIF) PROXIMAL HUMERUS FRACTURE, right ;  Surgeon: Budd Palmer, MD;  Location: [facility] OR;  Service: Orthopedics;  Laterality: Right;  . TRANSURETHRAL RESECTION OF PROSTATE N/A 06/30/2015   Procedure: TRANSURETHRAL RESECTION OF THE PROSTATE (TURP);  Surgeon: Crist Fat, MD;  Location: [facility] OR[facility];  Service: Urology;  Laterality: N/A;    FAMILY HISTORY: Family History  Problem Relation Age of Onset  . Aneurysm Father        Aortic aneurysm  . Macular degeneration Mother   . Gallbladder disease Mother   . Pancreatitis Sister   . Macular degeneration Sister        x 4    SOCIAL HISTORY: Social History   Social History  . Marital status: Married    Spouse name: N/A  . Number of children: 3  . Years of education: N/A   Occupational History  . retired    Social History Main Topics  . Smoking status: Former Smoker    Types: Cigars    Quit date: 05/06/1969  . Smokeless tobacco: Never Used  . Alcohol use No  . Drug use: No  . Sexual activity: Not on file   Other Topics Concern  . Not on file   Social History Narrative  . No narrative on file     PHYSICAL EXAM  Vitals:   10/24/16 1022  BP: 125/70  Pulse: 64  Weight: 129 lb (58.5 kg)  Height: 5\' 2"  (1.575 m)   Body mass index is 23.59 kg/m.  Generalized: Well developed, in no acute distress  Head: normocephalic and atraumatic,. Oropharynx benign  Neck: Supple,  no carotid bruits  Cardiac: Regular rate rhythm, no murmur  Musculoskeletal: No deformity   Neurological examination   Mentation: Alert oriented to time, place, history provided by daughter  Follows all commands, dysarthria and expressive aphasia.   Cranial nerve II-XII: Blinks to threat , extraocular movements were full, visual field were full on confrontational test. Right mild nasolabial fold flattening  hearing was intact to finger rubbing bilaterally. Uvula tongue midline. head turning and shoulder shrug were normal and symmetric.Tongue protrusion into cheek strength was normal. Motor: normal bulk and tone, full strength in the BUE, 4 out of 5 BLE,  Sensory: normal and symmetric to light touch,   Coordination: finger-nose-finger, , no dysmetria, no tremor  Reflexes: 1+ upper lower and symmetric plantar responses were flexor bilaterally. Gait and Station: Rising up from seated position with push off, wide  stance,  moderate stride, ambulated short distance with rolling walker, no difficulty with turns  DIAGNOSTIC DATA (LABS, IMAGING, TESTING) - I reviewed patient records, labs, notes, testing and imaging myself where available.  Lab Results  Component Value Date   WBC 11.4 08/09/2016   HGB 15.0 08/09/2016   HCT 46 08/09/2016   MCV 95.9 08/05/2016   PLT 225 08/09/2016      Component Value Date/Time   NA 148 (A) 08/09/2016   K 4.5 08/09/2016   CL 109 08/05/2016 1456   CO2 23 08/05/2016 1456   GLUCOSE 122 (H) 08/05/2016 1456   BUN 33 (A) 08/09/2016   CREATININE 1.3 08/09/2016   CREATININE 1.35 (H) 08/05/2016 1456   CALCIUM 8.9 08/05/2016 1456   PROT 6.4 (L) 08/03/2016 0411   ALBUMIN 3.3 (L) 08/03/2016 0411   AST 17 08/09/2016   ALT 15 08/09/2016   ALKPHOS 82 08/09/2016   BILITOT 0.6 08/03/2016 0411   GFRNONAA 43 (L) 08/05/2016 1456   GFRAA 50 (L) 08/05/2016 1456   Lab Results  Component Value Date   CHOL 158 08/01/2016   HDL 41 08/01/2016   LDLCALC 90 08/01/2016    TRIG 134 08/01/2016   CHOLHDL 3.9 08/01/2016   Lab Results  Component Value Date   HGBA1C 7.2 (H) 08/01/2016   Lab Results  Component Value Date   VITAMINB12 354 05/11/2015   No results found for: TSH    ASSESSMENT AND PLAN  83 y.o. year old male  has a past medical history of Anemia; Cataracts, bilateral; CKD (chronic kidney disease), stage III; Diabetes mellitus without complication (HCC); Distended bladder; Diverticulosis; Hard of hearing; Hiatal hernia; Hypertension; Kidney stones; Macular degeneration; paroxysmal afib; and Syncopal episodes. here For hospital follow-up small left MCA territory infarcts embolic likely secondary to large vessel atherosclerosis.The patient is a current patient of Dr. Roda ShuttersXu  who is out of the office today . This note is sent to the work in doctor.      PLAN: Stressed the importance of management of risk factors to prevent further stroke Restart Plavix for secondary stroke prevention Maintain strict control of hypertension with blood pressure goal below 130/90, today's reading 125/70  continue Norvasc Control of diabetes with hemoglobin A1c below 6.5 followed by primary care most recent hemoglobin A1c 7.2  continue diabetic medications Cholesterol with LDL cholesterol less than 70, followed by primary care,  most recent 90 restart Lipitor Continue physical therapy and speech therapy   Pure diet was recommended from the hospital  Continue to use rolling walker for safe ambulation at risk for falls Follow-up in 6 months Daughter refused to have cardiac event monitor Discussed risk for recurrent stroke/ TIA and answered additional questions This was a  visit requiring 30 minutes and medical decision making of high complexity with extensive review of history, hospital chart, counseling and answering questions Nilda RiggsNancy Carolyn Jamilett Ferrante, [facility] HospitalGNP, [facility], LLCBC, APRN  [facility] [address], [address] [place], [place] [id number] [phone]

## 2016-10-24 NOTE — Patient Instructions (Signed)
Stressed the importance of management of risk factors to prevent further stroke Restart Plavix for secondary stroke prevention Maintain strict control of hypertension with blood pressure goal below 130/90, today's reading 125/70  continue Norvasc Control of diabetes with hemoglobin A1c below 6.5 followed by primary care most recent hemoglobin A1c 7.2  continue diabetic medications Cholesterol with LDL cholesterol less than 70, followed by primary care,  most recent 90 restart Lipitor Continue physical therapy and speech therapy   Pure diet was recommended from the hospital  Continue to use rolling walker for safe ambulation at risk for falls Follow-up in 6 months

## 2016-11-26 ENCOUNTER — Telehealth: Payer: Self-pay | Admitting: Nurse Practitioner

## 2016-11-26 NOTE — Telephone Encounter (Signed)
Patient's daughter is calling to have Speech [facility].

## 2016-11-26 NOTE — Telephone Encounter (Signed)
Called and spoke to Patient's daughter relaying I will send Order  To [facility].  Wylie HailMary Claire will you place and order please. Thanks Annabelle Harmanana.

## 2016-11-26 NOTE — Telephone Encounter (Addendum)
Called dgtr, Jacki ConesLaurie to advise her that this office did not place speech therapy order. She stated that Dr Timothy Lassousso, PCP placed order for PT/OT/ST. She stated that the [facility] speech therapy "is disorganized". She stated he has been seen by four different therapists but no therapy started. This RN advised her to call Dr Ferd Hibbsusso's office and have them change order to [facility]. Advised her the office will most likely fax referral to [facility], and patient will get a call. She verbalized understanding, appreciation.

## 2016-12-05 ENCOUNTER — Ambulatory Visit: Payer: Medicare (Managed Care) | Attending: Nurse Practitioner | Admitting: Speech Pathology

## 2016-12-05 ENCOUNTER — Ambulatory Visit: Payer: Self-pay | Admitting: Speech Pathology

## 2016-12-05 DIAGNOSIS — R4701 Aphasia: Secondary | ICD-10-CM | POA: Insufficient documentation

## 2016-12-05 DIAGNOSIS — R1312 Dysphagia, oropharyngeal phase: Secondary | ICD-10-CM | POA: Insufficient documentation

## 2016-12-05 DIAGNOSIS — R471 Dysarthria and anarthria: Secondary | ICD-10-CM | POA: Insufficient documentation

## 2016-12-05 NOTE — Therapy (Signed)
[facility] [address] [place], [place], [address] Phone: [phone]   Fax:  [phone]  Speech Language Pathology Evaluation  Patient Details  Name: Clayton [name] MRN: [id number] Date of Birth: 10-03-1919 Referring Provider: Dr. Creola Corn  Encounter Date: 12/05/2016      End of Session - 12/05/16 1205    Visit Number 1   Number of Visits 17   Date for SLP Re-Evaluation 02/07/17   SLP Start Time 1102   SLP Stop Time  1150   SLP Time Calculation (min) 48 min   Activity Tolerance Patient tolerated treatment well      Past Medical History:  Diagnosis Date  . Anemia   . Cataracts, bilateral   . CKD (chronic kidney disease), stage III   . Diabetes mellitus without complication (HCC)   . Distended bladder   . Diverticulosis    on scan  . Hard of hearing   . Hiatal hernia   . Hypertension   . Kidney stones   . Macular degeneration   . paroxysmal afib    right bundle branch block  . Syncopal episodes     Past Surgical History:  Procedure Laterality Date  . EYE SURGERY     cataracts with lens implants  . HERNIA REPAIR    . KIDNEY STONE SURGERY     since young age  . ORIF HUMERUS FRACTURE Right 06/26/2012   Procedure: OPEN REDUCTION INTERNAL FIXATION (ORIF) PROXIMAL HUMERUS FRACTURE, right ;  Surgeon: Budd Palmer, MD;  Location: [facility] OR;  Service: Orthopedics;  Laterality: Right;  . TRANSURETHRAL RESECTION OF PROSTATE N/A 06/30/2015   Procedure: TRANSURETHRAL RESECTION OF THE PROSTATE (TURP);  Surgeon: Crist Fat, MD;  Location: [facility];  Service: Urology;  Laterality: N/A;    There were no vitals filed for this visit.      Subjective Assessment - 12/05/16 1112    Subjective "I think it's effortful to talk"   Patient is accompained by: Family member   Special Tests daughter, Clayton [name]    Currently in Pain? No/denies            SLP Evaluation [facility] - 12/05/16 1113      SLP Visit  Information   SLP Received On 12/05/16   Referring Provider Dr. Creola Corn   Onset Date 07/31/16   Medical Diagnosis CVA     Subjective   Patient/Family Stated Goal "To be able to swallow better and talk better"     Pain Assessment   Currently in Pain? No/denies     General Information   HPI Clayton [name] a 83 y.o. male suffered a CVA 07/31/16. He was hospitalized 07/31/16 to 08/05/16. He was d/c'd to a SNF for 1 week, then home with [facility] PT/ST.  MBSS on acute care 08/01/16 revealed severe dysphagia and aspiration and was d/c'd on Puree and Honey thick liquids. His Independent living facility does not offer modified diets.  At this time he is taking soft solids and thin liquids without overt difficulty per his daughter. He is known to me from prior course of dysphagia therapy January and February 2017. At that time, he had no penetration or aspiration per MBSS 05/03/15, however he had pharyngeal residuals post swallow and barium pill loged at [facility], and standing pharyngeal secretions. Daughter is not sure if pt is following his swallow precautions as she is not with him at meals. She reports inconsistent follow up of swallowing exercises as "they are  hard for him to understand"   Mobility Status uses walker and A from daughter     Prior Functional Status   Cognitive/Linguistic Baseline Baseline deficits   Baseline deficit details dysphagia,    Type of Home Independent living facility    Lives With Spouse   Available Support Family;Personal care attendant   Vocation Retired     IT consultant Comprehension   Overall Auditory Comprehension Impaired   Yes/No Questions Not tested   Commands Impaired   One Step Basic Commands 75-100% accurate   Two Step Basic Commands 75-100% accurate   Conversation Simple   Interfering Components Processing speed;Hearing;Visual impairments   EffectiveTechniques Extra processing time;Increased volume;Slowed speech;Repetition     Visual Recognition/Discrimination    Discrimination Exceptions to [facility]     Reading Comprehension   Reading Status Not tested     Expression   Primary Mode of Expression Verbal     Verbal Expression   Overall Verbal Expression Impaired   Initiation Impaired   Automatic Speech Counting;Day of week;Month of year  Slow, months 7/12   Repetition No impairment   Naming Impairment   Responsive 76-100% accurate   Confrontation 75-100% accurate   Convergent 75-100% accurate   Divergent 25-49% accurate   Interfering Components Speech intelligibility   Effective Techniques Open ended questions     Written Expression   Dominant Hand Right   Written Expression Not tested     Oral Motor/Sensory Function   Overall Oral Motor/Sensory Function Impaired   Labial ROM Reduced right;Reduced left   Labial Strength Reduced   Labial Sensation Reduced   Labial Coordination Reduced   Lingual ROM Reduced right;Reduced left   Lingual Strength Reduced   Lingual Sensation Reduced   Lingual Coordination Reduced   Facial ROM Within Functional Limits   Velum Within Functional Limits   Overall Oral Motor/Sensory Function Functional oral motor in eating with increased speed, coordination and ROM     Motor Speech   Overall Motor Speech Impaired   Respiration Impaired   Level of Impairment Phrase   Phonation Wet;Aphonic;Low vocal intensity   Resonance Within functional limits   Articulation Impaired   Level of Impairment Phrase   Intelligibility Intelligibility reduced   Word 75-100% accurate   Phrase 50-74% accurate   Sentence 50-74% accurate   Conversation 25-49% accurate   Interfering Components Hearing loss   Effective Techniques Pause;Increased vocal intensity                         SLP Education - 12/05/16 1204    Education provided Yes   Education Details goals for ST, HEP, breath support for speech   Person(s) Educated Patient   Methods Explanation;Demonstration;Verbal cues;Handout   Comprehension  Verbalized understanding;Returned demonstration;Verbal cues required;Need further instruction          SLP Short Term Goals - 12/05/16 1225      SLP SHORT TERM GOAL #1   Title Pt will perform HEP for dysarthria and dyphagia with occasional min A   Time 4   Period Weeks   Status New     SLP SHORT TERM GOAL #2   Title Pt will name 6 items in a category with occasional min A   Time 4     SLP SHORT TERM GOAL #3   Title Pt will respond to structured tasks  audibly with 2-4 word utterances 8/10 trials with occasional min A   Time 4   Period Weeks  Status New     SLP SHORT TERM GOAL #4   Title Pt will utilized adequate breath support for speech in structured tasks and social responses 80% of opportunities with occasiona min A   Time 4   Period Weeks   Status New          SLP Long Term Goals - 12/05/16 1233      SLP LONG TERM GOAL #1   Title Pt will follow swallow precautions with occasional min A   Time 8   Period Weeks   Status New     SLP LONG TERM GOAL #2   Title Pt will respond in conversation with 4-5 words and 90% intellgibility with occasional min A   Time 8   Period Weeks   Status New     SLP LONG TERM GOAL #3   Title Pt will utilize compensations for aphasia at sentence level with occasional min A   Time 8   Period Weeks   Status New          Plan - 12/05/16 1206    Clinical Impression Statement Mr. Melvyn NovasJohns, a 83 y.o. male know to me from prior course of dysphagia therapy 05/21/15 to 06/22/15. Pt is referred today for ongoing difficulties communicating with word finding impairment and volume impairment,as well as dysphagia since CVA 07/31/17.  Most recent  MBSS on acute care 08/01/16 revealed severe dysphgia with NPO recommended. Pt was d/c'd to SNF on Dysphgia 1 and Honey thick liquids. Pt is now taking soft solids and thin liquids at home according to daughter. Daughter denies memory impairment. Today, pt presents  To moderate aphasia and  dysarthria.  Confrontation naming intact. Auditory comprehension intact to 2 step commands. Responsive naming intact with extended time for processing. Pt named 3 items in a simple category. (10 is [facility] Spontaneous speech is limited to 1-2 word utterances. Pt presents with reduced volume, inconsistent voicing and reduced breath support for speech. In quiet environment, intellgibility at word level is 75%.  Daughter reports word finding difficulty at home affecting her and pt's wife's ability to meet his needs. Pt with mild drool and reduced swallowing of his secretions resulting in significant choking episode. Swallow screen of cup sips of water and cereal bar revelaed adequate mastication, clearing of oral residue and no overt s/s of aspiration. Can not r/o silent aspiration.  Pt did not follow prior swallow precautions.  I recommend skilled ST to maximize communication and safey of swallowing to reduce caregiver burden and improve pt's independence. Daughter reports pt enjoys eating his current diet as this is QOL for him, and in light of advanced age,  I do not recommend repeat swallow study at this time.   Speech Therapy Frequency 2x / week   Duration --  8 weeks   Treatment/Interventions Aspiration precaution training;Pharyngeal strengthening exercises;Diet toleration management by SLP;Language facilitation;Compensatory techniques;Environmental controls;Cognitive reorganization;Functional tasks;SLP instruction and feedback;Compensatory strategies;Patient/family education   Potential to Achieve Goals Fair   Potential Considerations Previous level of function   SLP Home Exercise Plan Loud /a/, loud hey, functional phrases, naming   Consulted and Agree with Plan of Care Patient;Family member/caregiver   Family Member Leticia ClasConsulted Laurie, daughter      Patient will benefit from skilled therapeutic intervention in order to improve the following deficits and impairments:   Aphasia - Plan: SLP plan of care  cert/re-cert  Dysarthria and anarthria - Plan: SLP plan of care cert/re-cert  Dysphagia, oropharyngeal phase - Plan: SLP plan  of care cert/re-cert      G-Codes - 12/05/16 1223    Functional Assessment Tool Used ASHA NOMS   Functional Limitations Spoken language expressive   Spoken Language Expression Current Status [phone](G9162) At least 60 percent but less than 80 percent impaired, limited or restricted   Spoken Language Expression Goal Status ([id number]) At least 40 percent but less than 60 percent impaired, limited or restricted      Problem List Patient Active Problem List   Diagnosis Date Noted  . Hyperlipemia 10/24/2016  . Aphasia   . Aortic atherosclerosis (HCC)   . CVA (cerebral vascular accident) (HCC) 07/31/2016  . CKD (chronic kidney disease), stage III 07/31/2016  . Urinary retention 06/30/2015  . AKI (acute kidney injury) (HCC) 05/22/2015  . CAP (community acquired pneumonia) 03/03/2015  . Acute renal failure superimposed on stage 3 chronic kidney disease (HCC) 02/07/2015  . Syncope 02/07/2015  . Normocytic anemia 02/07/2015  . Hyperkalemia 02/07/2015  . Diabetes mellitus without complication (HCC)   . paroxysmal afib   . Absolute anemia   . Faintness   . Absent pedal pulses 12/28/2014  . Essential hypertension, benign 06/27/2012  . Proximal humerus fracture, right 06/24/2012    Deangleo Passage, Radene JourneyLaura Ann MS, CCC-SLP 12/05/2016, 12:45 PM  [facility] [address] [place], [place], [address] Phone: [phone]   Fax:  [phone]  Name: Clayton [name] MRN: [id number] Date of Birth: January 22, 1920

## 2016-12-05 NOTE — Patient Instructions (Signed)
  Big breath before each exercise  Loud "HEY!"  Loud "AH!"  Hey You!  Jonette MateHey Jean!  Name 8:  Breakfast foods [employer] production planneranimals Colors Drinks Clothes States Presidents    Blow bubbles  [place] Park[place]low real bubble

## 2016-12-17 ENCOUNTER — Ambulatory Visit: Payer: Medicare (Managed Care) | Admitting: Speech Pathology

## 2016-12-17 DIAGNOSIS — R471 Dysarthria and anarthria: Secondary | ICD-10-CM

## 2016-12-17 DIAGNOSIS — R4701 Aphasia: Secondary | ICD-10-CM

## 2016-12-17 NOTE — Patient Instructions (Signed)
    SLOW LOUD OVER-ENNUNCIATE PAUSE  PA TA KA  [place]ATA [place]  BUTTERCUP  CATERPILLAR  BASEBALLL PLAYER  [place]  [place]  BUTTERFLY  SLOW AND BIG - EXAGGERATE YOUR MOUTH, MAKE EACH CONSONANT   Do exercises 10x each, 3x a day - in the mirror, slow and big  1. Alternate pucker and smile - OOO-EEE  2. Open mouth big Ahh-OOO with mouth open big  3. Press lips together flat and pop them open  4. Pucker and kiss big    .

## 2016-12-17 NOTE — Therapy (Signed)
[facility] [address] [place], [place], [address] Phone: [phone]   Fax:  [phone]  Speech Language Pathology Treatment  Patient Details  Name: Clayton [name] MRN: [id number] Date of Birth: 10-05-1919 Referring Provider: Dr. Creola Corn  Encounter Date: 12/17/2016      End of Session - 12/17/16 1500    Visit Number 2   Number of Visits 17   Date for SLP Re-Evaluation 02/07/17   SLP Start Time 1401   SLP Stop Time  1446   SLP Time Calculation (min) 45 min   Activity Tolerance Patient tolerated treatment well      Past Medical History:  Diagnosis Date  . Anemia   . Cataracts, bilateral   . CKD (chronic kidney disease), stage III   . Diabetes mellitus without complication (HCC)   . Distended bladder   . Diverticulosis    on scan  . Hard of hearing   . Hiatal hernia   . Hypertension   . Kidney stones   . Macular degeneration   . paroxysmal afib    right bundle branch block  . Syncopal episodes     Past Surgical History:  Procedure Laterality Date  . EYE SURGERY     cataracts with lens implants  . HERNIA REPAIR    . KIDNEY STONE SURGERY     since young age  . ORIF HUMERUS FRACTURE Right 06/26/2012   Procedure: OPEN REDUCTION INTERNAL FIXATION (ORIF) PROXIMAL HUMERUS FRACTURE, right ;  Surgeon: Budd Palmer, MD;  Location: [facility] OR;  Service: Orthopedics;  Laterality: Right;  . TRANSURETHRAL RESECTION OF PROSTATE N/A 06/30/2015   Procedure: TRANSURETHRAL RESECTION OF THE PROSTATE (TURP);  Surgeon: Crist Fat, MD;  Location: [facility];  Service: Urology;  Laterality: N/A;    There were no vitals filed for this visit.      Subjective Assessment - 12/17/16 1501    Subjective "Last night, he needed the blanket and I couldn't understand him" pt's spouse    Patient is accompained by: Family member   Special Tests daughter, [name]; spouse Carney Bern               ADULT SLP TREATMENT -  12/17/16 1451      General Information   Behavior/Cognition Alert;Requires cueing;Hard of hearing;Cooperative     Treatment Provided   Treatment provided Cognitive-Linquistic     Pain Assessment   Pain Assessment No/denies pain     Cognitive-Linquistic Treatment   Treatment focused on Dysarthria   Skilled Treatment Trained pt in breath support for speech with frequent mod A. Pt repeated simple functional phrases with adequqte volume with occasional min A for breath suport and over ennunciation. Attempted labial exercises, however pt required max A, with difficulty following model and instructions, so I trained pt, daughter, and spouse in using p,b words for labial exercise. Pt with drool 3x this session. He required cues to swallow throughout. Pt named 4 items for simple categories with semantic and phonemic cues. Word finding difficulties persist. Pt answered simple biographical and personal history questions with 1-2 word utterances and occasional request for repeat due to low volume and imprecise articulation.     Assessment / Recommendations / Plan   Plan Continue with current plan of care     Progression Toward Goals   Progression toward goals Progressing toward goals          SLP Education - 12/17/16 1457    Education provided Yes  Education Details compensations for dysarthria and aphasia   Person(s) Educated Patient;Spouse;Child(ren)   Methods Explanation;Demonstration;Verbal cues   Comprehension Verbalized understanding;Returned demonstration;Verbal cues required;Tactile cues required;Need further instruction          SLP Short Term Goals - 12/17/16 1500      SLP SHORT TERM GOAL #1   Title Pt will perform HEP for dysarthria and dyphagia with occasional min A   Time 4   Period Weeks   Status On-going     SLP SHORT TERM GOAL #2   Title Pt will name 6 items in a category with occasional min A   Time 4   Status On-going     SLP SHORT TERM GOAL #3   Title Pt  will respond to structured tasks  audibly with 2-4 word utterances 8/10 trials with occasional min A   Time 4   Period Weeks   Status On-going     SLP SHORT TERM GOAL #4   Title Pt will utilized adequate breath support for speech in structured tasks and social responses 80% of opportunities with occasiona min A   Time 4   Period Weeks   Status On-going          SLP Long Term Goals - 12/17/16 1500      SLP LONG TERM GOAL #1   Title Pt will follow swallow precautions with occasional min A   Time 8   Period Weeks   Status On-going     SLP LONG TERM GOAL #2   Title Pt will respond in conversation with 4-5 words and 90% intellgibility with occasional min A   Time 8   Period Weeks   Status On-going     SLP LONG TERM GOAL #3   Title Pt will utilize compensations for aphasia at sentence level with occasional min A   Time 8   Period Weeks   Status On-going          Plan - 12/17/16 1458    Clinical Impression Statement Initiated training in breath support for volume and over articulation to improve intellgibility with frequent mod A. Divergent naming with frequent semantic and phonemic cues. Adequate volume facilitated with usual min A repeating functional phrases. Continue skilled ST to maximize intellgiblity to reduce caregiver burden and for QOL.      Patient will benefit from skilled therapeutic intervention in order to improve the following deficits and impairments:   Aphasia  Dysarthria and anarthria    Problem List Patient Active Problem List   Diagnosis Date Noted  . Hyperlipemia 10/24/2016  . Aphasia   . Aortic atherosclerosis (HCC)   . CVA (cerebral vascular accident) (HCC) 07/31/2016  . CKD (chronic kidney disease), stage III 07/31/2016  . Urinary retention 06/30/2015  . AKI (acute kidney injury) (HCC) 05/22/2015  . CAP (community acquired pneumonia) 03/03/2015  . Acute renal failure superimposed on stage 3 chronic kidney disease (HCC) 02/07/2015  .  Syncope 02/07/2015  . Normocytic anemia 02/07/2015  . Hyperkalemia 02/07/2015  . Diabetes mellitus without complication (HCC)   . paroxysmal afib   . Absolute anemia   . Faintness   . Absent pedal pulses 12/28/2014  . Essential hypertension, benign 06/27/2012  . Proximal humerus fracture, right 06/24/2012    Alicha Raspberry, Radene Journey MS, CCC-SLP 12/17/2016, 3:02 PM  [facility] [address] [place], [place], [address] Phone: [phone]   Fax:  [phone]   Name: Clayton [name] MRN: [id number] Date of  Birth: 1919-10-30

## 2016-12-19 ENCOUNTER — Ambulatory Visit: Payer: Medicare (Managed Care) | Admitting: Speech Pathology

## 2016-12-19 DIAGNOSIS — R4701 Aphasia: Secondary | ICD-10-CM | POA: Diagnosis not present

## 2016-12-19 DIAGNOSIS — R1312 Dysphagia, oropharyngeal phase: Secondary | ICD-10-CM

## 2016-12-19 DIAGNOSIS — R471 Dysarthria and anarthria: Secondary | ICD-10-CM

## 2016-12-19 NOTE — Therapy (Signed)
[facility] [address] [place], [place], [address] Phone: [phone]   Fax:  [phone]  Speech Language Pathology Treatment  Patient Details  Name: Clayton [name] MRN: [id number] Date of Birth: [birth year] Referring Provider: Dr. Creola Corn  Encounter Date: 12/19/2016      End of Session - 12/19/16 1441    Visit Number 3   Number of Visits 17   Date for SLP Re-Evaluation 02/07/17   SLP Start Time 1016   SLP Stop Time  1100   SLP Time Calculation (min) 44 min   Activity Tolerance Patient tolerated treatment well      Past Medical History:  Diagnosis Date  . Anemia   . Cataracts, bilateral   . CKD (chronic kidney disease), stage III   . Diabetes mellitus without complication (HCC)   . Distended bladder   . Diverticulosis    on scan  . Hard of hearing   . Hiatal hernia   . Hypertension   . Kidney stones   . Macular degeneration   . paroxysmal afib    right bundle branch block  . Syncopal episodes     Past Surgical History:  Procedure Laterality Date  . EYE SURGERY     cataracts with lens implants  . HERNIA REPAIR    . KIDNEY STONE SURGERY     since young age  . ORIF HUMERUS FRACTURE Right 06/26/2012   Procedure: OPEN REDUCTION INTERNAL FIXATION (ORIF) PROXIMAL HUMERUS FRACTURE, right ;  Surgeon: Budd Palmer, MD;  Location: [facility];  Service: Orthopedics;  Laterality: Right;  . TRANSURETHRAL RESECTION OF PROSTATE N/A 06/30/2015   Procedure: TRANSURETHRAL RESECTION OF THE PROSTATE (TURP);  Surgeon: Crist Fat, MD;  Location: [facility];  Service: Urology;  Laterality: N/A;    There were no vitals filed for this visit.      Subjective Assessment - 12/19/16 1048    Subjective "He spoke loud yesterday"   Patient is accompained by: Family member   Special Tests daughter, [name]; spouse Carney Bern               ADULT SLP TREATMENT - 12/19/16 1048      General Information   Behavior/Cognition Alert;Requires cueing;Hard of hearing;Cooperative     Treatment Provided   Treatment provided Cognitive-Linquistic     Pain Assessment   Pain Assessment No/denies pain     Cognitive-Linquistic Treatment   Treatment focused on Dysarthria   Skilled Treatment Pt performed HEP for dysarthria with frequent mod A - daughter present and cueing modeled for her as she will be carrying over HEP and cueing for dysarthria strategies at home. Pt utilized compensations of slow rate, breath support, chunking  1-3 words and volume with modeling, verbal cues frequently to be intellgilble in simple structured phrases. Pt answered conversational questions in 2-3 words with usual request for repain.  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      Assessment / Recommendations / Plan   Plan Continue with current plan of care     Progression Toward Goals   Progression toward goals Progressing toward goals            SLP Short Term Goals - 12/19/16 1441      SLP SHORT TERM GOAL #1   Title Pt will perform HEP for dysarthria and dyphagia with occasional min A  Time 4   Period Weeks   Status On-going     SLP SHORT TERM GOAL #2   Title Pt will name 6 items in a category with occasional min A   Time 4   Status On-going     SLP SHORT TERM GOAL #3   Title Pt will respond to structured tasks  audibly with 2-4 word utterances 8/10 trials with occasional min A   Time 4   Period Weeks   Status On-going     SLP SHORT TERM GOAL #4   Title Pt will utilized adequate breath support for speech in structured tasks and social responses 80% of opportunities with occasiona min A   Time 4   Period Weeks   Status On-going          SLP Long Term Goals - 12/19/16 1441      SLP LONG TERM GOAL #1   Title Pt will follow swallow precautions with occasional  min A   Time 8   Period Weeks   Status On-going     SLP LONG TERM GOAL #2   Title Pt will respond in conversation with 4-5 words and 90% intellgibility with occasional min A   Time 8   Period Weeks   Status On-going     SLP LONG TERM GOAL #3   Title Pt will utilize compensations for aphasia at sentence level with occasional min A   Time 8   Period Weeks   Status On-going          Plan - 12/19/16 1441    Clinical Impression Statement Initiated training in breath support for volume and over articulation to improve intellgibility with frequent mod A. Divergent naming with frequent semantic and phonemic cues. Adequate volume facilitated with usual min A repeating functional phrases. Continue skilled ST to maximize intellgiblity to reduce caregiver burden and for QOL.   Speech Therapy Frequency 2x / week   Treatment/Interventions Aspiration precaution training;Pharyngeal strengthening exercises;Diet toleration management by SLP;Language facilitation;Compensatory techniques;Environmental controls;Cognitive reorganization;Functional tasks;SLP instruction and feedback;Compensatory strategies;Patient/family education   Potential to Achieve Goals Fair   Potential Considerations Previous level of function   SLP Home Exercise Plan Loud /a/, loud hey, functional phrases, naming   Consulted and Agree with Plan of Care Patient;Family member/caregiver   Family Member Leticia ClasConsulted Laurie, daughter, spouse Carney BernJean      Patient will benefit from skilled therapeutic intervention in order to improve the following deficits and impairments:   Aphasia  Dysarthria and anarthria  Dysphagia, oropharyngeal phase    Problem List Patient Active Problem List   Diagnosis Date Noted  . Hyperlipemia 10/24/2016  . Aphasia   . Aortic atherosclerosis (HCC)   . CVA (cerebral vascular accident) (HCC) 07/31/2016  . CKD (chronic kidney disease), stage III 07/31/2016  . Urinary retention 06/30/2015  . AKI (acute  kidney injury) (HCC) 05/22/2015  . CAP (community acquired pneumonia) 03/03/2015  . Acute renal failure superimposed on stage 3 chronic kidney disease (HCC) 02/07/2015  . Syncope 02/07/2015  . Normocytic anemia 02/07/2015  . Hyperkalemia 02/07/2015  . Diabetes mellitus without complication (HCC)   . paroxysmal afib   . Absolute anemia   . Faintness   . Absent pedal pulses 12/28/2014  . Essential hypertension, benign 06/27/2012  . Proximal humerus fracture, right 06/24/2012    Kimothy Kishimoto, Radene JourneyLaura Ann  MS, CCC-SLP 12/19/2016, 2:42 PM  [facility] [address] [place], [place], [address] Phone: [phone]   Fax:  [phone]   Name: Clayton [name] MRN: [id number] Date of Birth: [birth year]

## 2016-12-24 ENCOUNTER — Ambulatory Visit: Payer: Medicare (Managed Care) | Admitting: Speech Pathology

## 2016-12-24 DIAGNOSIS — R1312 Dysphagia, oropharyngeal phase: Secondary | ICD-10-CM

## 2016-12-24 DIAGNOSIS — R4701 Aphasia: Secondary | ICD-10-CM | POA: Diagnosis not present

## 2016-12-24 DIAGNOSIS — R471 Dysarthria and anarthria: Secondary | ICD-10-CM

## 2016-12-24 NOTE — Therapy (Signed)
[facility] [address] [place], [place], [address] Phone: [phone]   Fax:  [phone]  Speech Language Pathology Treatment  Patient Details  Name: Clayton [name] MRN: [id number] Date of Birth: November 17, 1919 Referring Provider: Dr. Creola Corn  Encounter Date: 12/24/2016      End of Session - 12/24/16 1351    Visit Number 4   Number of Visits 17   Date for SLP Re-Evaluation 02/07/17   SLP Start Time 1020   SLP Stop Time  1101   SLP Time Calculation (min) 41 min   Activity Tolerance Patient tolerated treatment well      Past Medical History:  Diagnosis Date  . Anemia   . Cataracts, bilateral   . CKD (chronic kidney disease), stage III   . Diabetes mellitus without complication (HCC)   . Distended bladder   . Diverticulosis    on scan  . Hard of hearing   . Hiatal hernia   . Hypertension   . Kidney stones   . Macular degeneration   . paroxysmal afib    right bundle branch block  . Syncopal episodes     Past Surgical History:  Procedure Laterality Date  . EYE SURGERY     cataracts with lens implants  . HERNIA REPAIR    . KIDNEY STONE SURGERY     since young age  . ORIF HUMERUS FRACTURE Right 06/26/2012   Procedure: OPEN REDUCTION INTERNAL FIXATION (ORIF) PROXIMAL HUMERUS FRACTURE, right ;  Surgeon: Budd Palmer, MD;  Location: [facility];  Service: Orthopedics;  Laterality: Right;  . TRANSURETHRAL RESECTION OF PROSTATE N/A 06/30/2015   Procedure: TRANSURETHRAL RESECTION OF THE PROSTATE (TURP);  Surgeon: Crist Fat, MD;  Location: [facility];  Service: Urology;  Laterality: N/A;    There were no vitals filed for this visit.      Subjective Assessment - 12/24/16 1340    Subjective "I think he's a little better" - Pt missing right hearing aid today               ADULT SLP TREATMENT - 12/24/16 1338      General Information   Behavior/Cognition Alert;Requires cueing;Hard of  hearing;Cooperative     Treatment Provided   Treatment provided Cognitive-Linquistic     Pain Assessment   Pain Assessment No/denies pain     Cognitive-Linquistic Treatment   Treatment focused on Dysarthria   Skilled Treatment Re-calibrated volume with imitating loud "Hey!" "Hey Hey" and "Jonette Mate" with rare min A for good volume. Imitated "pop" "pop-pop" and "bobby" for labial coordination with occasional min A. Pt did not repeat "Buttercup" or "caterpillar despite mod to max A. Facilitated coordinating breathing and phonation with imitation of 5-7 word sentences (pt blind). Pt took breath when voicing stopped with occasional min A. Also targeted chuncking sentences into 2-3 words per breath with occasional min A. Pt required cues to clear his throat and swallow with hydrophonia 3/5x. Simple naming - states, singers, western actors with frequent mod phrase completion, semantic and 1st letter cues. Modeled cueing for daughter, who demonstrated appropriate cueing for naming task with supervision cues.      Assessment / Recommendations / Plan   Plan Continue with current plan of care     Progression Toward Goals   Progression toward goals Progressing toward goals          SLP Education - 12/24/16 1345    Education provided Yes   Education Details cueing  for pt in naming tasks, cueing for pt to breathe when aphonic   Person(s) Educated Patient;Spouse;Child(ren)   Methods Explanation;Demonstration;Verbal cues   Comprehension Verbalized understanding;Returned demonstration;Verbal cues required;Need further instruction;Tactile cues required          SLP Short Term Goals - 12/24/16 1351      SLP SHORT TERM GOAL #1   Title Pt will perform HEP for dysarthria and dyphagia with occasional min A   Time 3   Period Weeks   Status On-going     SLP SHORT TERM GOAL #2   Title Pt will name 6 items in a category with occasional min A   Time 3   Status On-going     SLP SHORT TERM GOAL #3    Title Pt will respond to structured tasks  audibly with 2-4 word utterances 8/10 trials with occasional min A   Time 3   Period Weeks   Status On-going     SLP SHORT TERM GOAL #4   Title Pt will utilized adequate breath support for speech in structured tasks and social responses 80% of opportunities with occasiona min A   Time 3   Period Weeks   Status On-going          SLP Long Term Goals - 12/24/16 1351      SLP LONG TERM GOAL #1   Title Pt will follow swallow precautions with occasional min A   Time 7   Period Weeks   Status On-going     SLP LONG TERM GOAL #2   Title Pt will respond in conversation with 4-5 words and 90% intellgibility with occasional min A   Time 7   Period Weeks   Status On-going     SLP LONG TERM GOAL #3   Title Pt will utilize compensations for aphasia at sentence level with occasional min A   Time 7   Period Weeks   Status On-going          Plan - 12/24/16 1345    Clinical Impression Statement Pt required occasional min A for breath support and chunking sentences into 2-3 words per breath. Naming with frequent semantic, sentence completion and 1st letter cues. Pt reports coughing at night on his saliva - encouraged good oral hygiene before bed due to known pooling of secretion in his pharynx and known aspiration. Continue skilled ST to maximize intellgiblity and verbal expression for QOL and to reduce caretiver burden   Speech Therapy Frequency 2x / week   Treatment/Interventions Aspiration precaution training;Pharyngeal strengthening exercises;Diet toleration management by SLP;Language facilitation;Compensatory techniques;Environmental controls;Cognitive reorganization;Functional tasks;SLP instruction and feedback;Compensatory strategies;Patient/family education   Potential to Achieve Goals Fair   SLP Home Exercise Plan Loud /a/, loud hey, functional phrases, naming   Consulted and Agree with Plan of Care Patient;Family member/caregiver   Family  Member Leticia Clas, daughter, spouse Carney Bern      Patient will benefit from skilled therapeutic intervention in order to improve the following deficits and impairments:   Dysarthria and anarthria  Aphasia  Dysphagia, oropharyngeal phase    Problem List Patient Active Problem List   Diagnosis Date Noted  . Hyperlipemia 10/24/2016  . Aphasia   . Aortic atherosclerosis (HCC)   . CVA (cerebral vascular accident) (HCC) 07/31/2016  . CKD (chronic kidney disease), stage III 07/31/2016  . Urinary retention 06/30/2015  . AKI (acute kidney injury) (HCC) 05/22/2015  . CAP (community acquired pneumonia) 03/03/2015  . Acute renal failure superimposed on stage 3 chronic kidney  disease (HCC) 02/07/2015  . Syncope 02/07/2015  . Normocytic anemia 02/07/2015  . Hyperkalemia 02/07/2015  . Diabetes mellitus without complication (HCC)   . paroxysmal afib   . Absolute anemia   . Faintness   . Absent pedal pulses 12/28/2014  . Essential hypertension, benign 06/27/2012  . Proximal humerus fracture, right 06/24/2012    Kamira Mellette, Radene Journey MS, CCC-SLP 12/24/2016, 1:53 PM  [facility] [address] [place], [place], [address] Phone: [phone]   Fax:  [phone]   Name: Clayton [name] MRN: [id number] Date of Birth: Dec 18, 1919

## 2016-12-24 NOTE — Patient Instructions (Signed)
  Name Christmas words  Name holidays  Name drinks  Name toys  Elesa Hacker thinks

## 2016-12-31 ENCOUNTER — Ambulatory Visit: Payer: Medicare (Managed Care) | Admitting: Podiatry

## 2017-01-02 ENCOUNTER — Encounter: Payer: Medicare (Managed Care) | Admitting: Speech Pathology

## 2017-01-02 ENCOUNTER — Ambulatory Visit: Payer: Medicare (Managed Care)

## 2017-01-07 ENCOUNTER — Ambulatory Visit: Payer: Medicare (Managed Care) | Attending: Nurse Practitioner | Admitting: Speech Pathology

## 2017-01-07 DIAGNOSIS — R4701 Aphasia: Secondary | ICD-10-CM | POA: Diagnosis present

## 2017-01-07 DIAGNOSIS — R471 Dysarthria and anarthria: Secondary | ICD-10-CM

## 2017-01-07 DIAGNOSIS — R1312 Dysphagia, oropharyngeal phase: Secondary | ICD-10-CM | POA: Diagnosis present

## 2017-01-07 NOTE — Patient Instructions (Addendum)
  Avoid mixed consistencies (liquid with a solid)   Soup, cereal, fruits, fruit cocktails,   Try to eat softer foods and avoid foods with little pieces  Do 2-3x a day Hard, effortful swallow - 5-10x  Stick out your tongue and swallow 5-10x  Stick out your tonuge and say GA GA GA! 5x to 10x  Say iNG-GA  Sing

## 2017-01-07 NOTE — Therapy (Signed)
[facility] [address] [place], [place], [address] Phone: [phone]   Fax:  [phone]  Speech Language Pathology Treatment  Patient Details  Name: Clayton [name] MRN: [id number] Date of Birth: 1920/01/25 Referring Provider: Dr. Creola Corn  Encounter Date: 01/07/2017      End of Session - 01/07/17 1219    Visit Number 5   Number of Visits 17   Date for SLP Re-Evaluation 02/07/17   SLP Start Time 1018   SLP Stop Time  1059   SLP Time Calculation (min) 41 min   Activity Tolerance Patient tolerated treatment well      Past Medical History:  Diagnosis Date  . Anemia   . Cataracts, bilateral   . CKD (chronic kidney disease), stage III   . Diabetes mellitus without complication (HCC)   . Distended bladder   . Diverticulosis    on scan  . Hard of hearing   . Hiatal hernia   . Hypertension   . Kidney stones   . Macular degeneration   . paroxysmal afib    right bundle branch block  . Syncopal episodes     Past Surgical History:  Procedure Laterality Date  . EYE SURGERY     cataracts with lens implants  . HERNIA REPAIR    . KIDNEY STONE SURGERY     since young age  . ORIF HUMERUS FRACTURE Right 06/26/2012   Procedure: OPEN REDUCTION INTERNAL FIXATION (ORIF) PROXIMAL HUMERUS FRACTURE, right ;  Surgeon: Budd Palmer, MD;  Location: [facility];  Service: Orthopedics;  Laterality: Right;  . TRANSURETHRAL RESECTION OF PROSTATE N/A 06/30/2015   Procedure: TRANSURETHRAL RESECTION OF THE PROSTATE (TURP);  Surgeon: Crist Fat, MD;  Location: [facility];  Service: Urology;  Laterality: N/A;    There were no vitals filed for this visit.      Subjective Assessment - 01/07/17 1201    Subjective "He had pneumonia last week"   Patient is accompained by: Family member   Special Tests daughter, [name]; spouse Carney Bern   Currently in Pain? No/denies               ADULT SLP TREATMENT - 01/07/17 1202       General Information   Behavior/Cognition Alert;Requires cueing;Hard of hearing;Cooperative     Treatment Provided   Treatment provided Cognitive-Linquistic;Dysphagia     Dysphagia Treatment   Temperature Spikes Noted No   Treatment Methods Therapeutic exercise;Compensation strategy training;Patient/caregiver education   Type of cueing Tactile;Verbal   Amount of cueing Maximal   Other treatment/comments Pt required max A to complete 3-5 reps of swallow HEP. Daughter reports he has diffiuclty completing them at home. I reviewed diet recommendations with them, however independent living facility where they live does not modify diets. Emphasized oral care and avoiding mix consistencies, in light of recent pna.  Daughter also reports that recent CXR revealed large hiatal hernia. Pt affirms he lays down immediately after dinner and coughs at night. I instructed pt to remain upright 45 to 60 minutes after eating,        Pain Assessment   Pain Assessment No/denies pain     Cognitive-Linquistic Treatment   Treatment focused on Dysarthria   Skilled Treatment Pt utilized audible volume with breath support in structured tasks, answering simple questions and completing idioms (4-6 word responses). He is taking a breath inbetween syllables in multisyllabic words to maintain volume/phonation with min A. Daughter reports improved carryover of volume/phonation at  home. He required cues to clear his throat when hydrophonia occurred 3/4 times.      Assessment / Recommendations / Plan   Plan Continue with current plan of care     Dysphagia Recommendations   Diet recommendations Dysphagia 1 (puree);Honey-thick liquid  per MBSS 07/31/16   Liquids provided via Cup   Medication Administration Crushed with puree   Supervision Full supervision/cueing for compensatory strategies   Compensations Slow rate;Small sips/bites;Clear throat after each swallow     Progression Toward Goals   Progression toward goals  Progressing toward goals          SLP Education - 01/07/17 1209    Education provided Yes   Education Details diet modifications, stay upright after meals           SLP Short Term Goals - 01/07/17 1218      SLP SHORT TERM GOAL #1   Title Pt will perform HEP for dysarthria and dyphagia with occasional min A   Time 2   Period Weeks   Status On-going     SLP SHORT TERM GOAL #2   Title Pt will name 6 items in a category with occasional min A   Time 2   Status On-going     SLP SHORT TERM GOAL #3   Title Pt will respond to structured tasks  audibly with 2-4 word utterances 8/10 trials with occasional min A   Time 2   Period Weeks   Status Achieved     SLP SHORT TERM GOAL #4   Title Pt will utilized adequate breath support for speech in structured tasks and social responses 80% of opportunities with occasiona min A   Time 2   Period Weeks   Status On-going          SLP Long Term Goals - 01/07/17 1219      SLP LONG TERM GOAL #1   Title Pt will follow swallow precautions with occasional min A   Time 6   Period Weeks   Status On-going     SLP LONG TERM GOAL #2   Title Pt will respond in conversation with 4-5 words and 90% intellgibility with occasional min A   Time 6   Period Weeks   Status On-going     SLP LONG TERM GOAL #3   Title Pt will utilize compensations for aphasia at sentence level with occasional min A   Time 6   Period Weeks   Status On-going          Plan - 01/07/17 1212    Clinical Impression Statement Pt with recent pna last week. Reviewed swallow precautions, HEP for dysphagia and diet recommendations. Pt does not have a caregiver at meals to assist with carryover of precautions and diet modifications. Speech volume and intelligibility continue to improve. Pt requring min A to use frequent breaths to maintain volume. Daughter reports she and much improved ease communicating with her father at home. Continue skilled ST to maximize  intellgibility, communication and swallow precautions.    Speech Therapy Frequency 2x / week   Treatment/Interventions Aspiration precaution training;Pharyngeal strengthening exercises;Diet toleration management by SLP;Language facilitation;Compensatory techniques;Environmental controls;Cognitive reorganization;Functional tasks;SLP instruction and feedback;Compensatory strategies;Patient/family education   Potential to Achieve Goals Fair   Potential Considerations Previous level of function   Consulted and Agree with Plan of Care Patient;Family member/caregiver   Family Member Leticia ClasConsulted Laurie, daughter, spouse Carney BernJean      Patient will benefit from skilled therapeutic intervention in order to  improve the following deficits and impairments:   Dysarthria and anarthria  Aphasia  Dysphagia, oropharyngeal phase    Problem List Patient Active Problem List   Diagnosis Date Noted  . Hyperlipemia 10/24/2016  . Aphasia   . Aortic atherosclerosis (HCC)   . CVA (cerebral vascular accident) (HCC) 07/31/2016  . CKD (chronic kidney disease), stage III 07/31/2016  . Urinary retention 06/30/2015  . AKI (acute kidney injury) (HCC) 05/22/2015  . CAP (community acquired pneumonia) 03/03/2015  . Acute renal failure superimposed on stage 3 chronic kidney disease (HCC) 02/07/2015  . Syncope 02/07/2015  . Normocytic anemia 02/07/2015  . Hyperkalemia 02/07/2015  . Diabetes mellitus without complication (HCC)   . paroxysmal afib   . Absolute anemia   . Faintness   . Absent pedal pulses 12/28/2014  . Essential hypertension, benign 06/27/2012  . Proximal humerus fracture, right 06/24/2012    Rigley Niess, Radene Journey  MS, CCC-SLP 01/07/2017, 12:20 PM  [facility] [address] [place], [place], [address] Phone: [phone]   Fax:  [phone]   Name: PHUOC HUY MRN: [id number] Date of Birth: [birth year]

## 2017-01-09 ENCOUNTER — Ambulatory Visit: Payer: Medicare (Managed Care)

## 2017-01-09 DIAGNOSIS — R471 Dysarthria and anarthria: Secondary | ICD-10-CM

## 2017-01-09 DIAGNOSIS — R4701 Aphasia: Secondary | ICD-10-CM

## 2017-01-09 NOTE — Therapy (Signed)
[facility] [address] [place], [place], [address] Phone: [phone]   Fax:  [phone]  Speech Language Pathology Treatment  Patient Details  Name: Clayton [name] MRN: [id number] Date of Birth: 23-Mar-1920 Referring Provider: Dr. Creola Corn  Encounter Date: 01/09/2017      End of Session - 01/09/17 1241    Visit Number 6   Number of Visits 17   Date for SLP Re-Evaluation 02/07/17   SLP Start Time 1106   SLP Stop Time  1150   SLP Time Calculation (min) 44 min   Activity Tolerance Patient limited by lethargy      Past Medical History:  Diagnosis Date  . Anemia   . Cataracts, bilateral   . CKD (chronic kidney disease), stage III   . Diabetes mellitus without complication (HCC)   . Distended bladder   . Diverticulosis    on scan  . Hard of hearing   . Hiatal hernia   . Hypertension   . Kidney stones   . Macular degeneration   . paroxysmal afib    right bundle branch block  . Syncopal episodes     Past Surgical History:  Procedure Laterality Date  . EYE SURGERY     cataracts with lens implants  . HERNIA REPAIR    . KIDNEY STONE SURGERY     since young age  . ORIF HUMERUS FRACTURE Right 06/26/2012   Procedure: OPEN REDUCTION INTERNAL FIXATION (ORIF) PROXIMAL HUMERUS FRACTURE, right ;  Surgeon: Budd Palmer, MD;  Location: [facility];  Service: Orthopedics;  Laterality: Right;  . TRANSURETHRAL RESECTION OF PROSTATE N/A 06/30/2015   Procedure: TRANSURETHRAL RESECTION OF THE PROSTATE (TURP);  Surgeon: Crist Fat, MD;  Location: [facility];  Service: Urology;  Laterality: N/A;    There were no vitals filed for this visit.      Subjective Assessment - 01/09/17 1241    Subjective "I have to cancel his next appt - they just have too much going on that day."   Patient is accompained by: Family member   Special Tests daughter, [name]; spouse Carney Bern   Currently in Pain? No/denies                ADULT SLP TREATMENT - 01/09/17 1241      General Information   Behavior/Cognition Alert;Cooperative;Hard of hearing;Requires cueing     Treatment Provided   Treatment provided Dysphagia     Dysphagia Treatment   Temperature Spikes Noted No   Respiratory Status Room air   Treatment Methods Therapeutic exercise;Patient/caregiver education;Compensation strategy training   Type of cueing Verbal;Tactile   Amount of cueing Maximal   Other treatment/comments Pt req'd verbal and visual and demo cues for HEP procedure. SLP problem solved with pt, wife and daughter about how pt could complete HEP x2-3/day with # reps recommened, as aide only there for 2 hours in AMs. Daughter to call pt at the same time every day to tell pt to do HEP and then call to verify he completed HEP. Unfortunately no one present to help pt during HEP for proper completion of exercises due to pt reduced memory skills. SLP reviewed diet precautions as well.      Cognitive-Linquistic Treatment   Skilled Treatment Pt's speech output was softer than WNL, approx low to mid 60s dB. SLP targeted improved speech loudness with reps of "HEY!" and "HEY (family member)!" Pt generated louder imitations with family names than with just "Hey!" imitations from  SLP.     Assessment / Recommendations / Plan   Plan Continue with current plan of care     Dysphagia Recommendations   Diet recommendations Honey-thick liquid;Dysphagia 1 (puree)   Liquids provided via Cup   Medication Administration Crushed with puree   Supervision Full supervision/cueing for compensatory strategies   Compensations Slow rate;Small sips/bites;Clear throat after each swallow     Progression Toward Goals   Progression toward goals Progressing toward goals          SLP Education - 01/09/17 1240    Education provided Yes   Education Details HEP procedure, diet modifications, options for having pt complete HEP x2-3/day   Person(s) Educated  Patient;Spouse;Child(ren)   Methods Explanation;Demonstration;Verbal cues;Handout   Comprehension Verbalized understanding;Need further instruction;Returned demonstration          SLP Short Term Goals - 01/09/17 1250      SLP SHORT TERM GOAL #1   Title Pt will perform HEP for dysarthria and dyphagia with occasional min A   Time 2   Period Weeks   Status On-going     SLP SHORT TERM GOAL #2   Title Pt will name 6 items in a category with occasional min A   Time 2   Status On-going     SLP SHORT TERM GOAL #3   Title Pt will respond to structured tasks  audibly with 2-4 word utterances 8/10 trials with occasional min A   Time 2   Period Weeks   Status Achieved     SLP SHORT TERM GOAL #4   Title Pt will utilized adequate breath support for speech in structured tasks and social responses 80% of opportunities with occasiona min A   Time 2   Period Weeks   Status On-going          SLP Long Term Goals - 01/09/17 1250      SLP LONG TERM GOAL #1   Title Pt will follow swallow precautions with occasional min A   Time 6   Period Weeks   Status On-going     SLP LONG TERM GOAL #2   Title Pt will respond in conversation with 4-5 words and 90% intellgibility with occasional min A   Time 6   Period Weeks   Status On-going     SLP LONG TERM GOAL #3   Title Pt will utilize compensations for aphasia at sentence level with occasional min A   Time 6   Period Weeks   Status On-going          Plan - 01/09/17 1248    Clinical Impression Statement SLP reviewed HEP for dysphagia, and diet recommendations. Pt does not have a caregiver at meals to assist with carryover of precautions, or for assessing correctness of HEP >1/day. Speech volume and intelligibility were also targeted today with use of loud speech to train pt to maintain a more appropriate speech volume. Continue skilled ST to maximize intellgibility, communication and swallow precautions.    Speech Therapy Frequency 2x /  week   Treatment/Interventions Aspiration precaution training;Pharyngeal strengthening exercises;Diet toleration management by SLP;Language facilitation;Compensatory techniques;Environmental controls;Cognitive reorganization;Functional tasks;SLP instruction and feedback;Compensatory strategies;Patient/family education   Potential to Achieve Goals Fair   Potential Considerations Previous level of function   Consulted and Agree with Plan of Care Patient;Family member/caregiver   Family Member Leticia Clas, daughter, spouse Carney Bern      Patient will benefit from skilled therapeutic intervention in order to improve the following deficits and impairments:  Dysarthria and anarthria  Aphasia    Problem List Patient Active Problem List   Diagnosis Date Noted  . Hyperlipemia 10/24/2016  . Aphasia   . Aortic atherosclerosis (HCC)   . CVA (cerebral vascular accident) (HCC) 07/31/2016  . CKD (chronic kidney disease), stage III 07/31/2016  . Urinary retention 06/30/2015  . AKI (acute kidney injury) (HCC) 05/22/2015  . CAP (community acquired pneumonia) 03/03/2015  . Acute renal failure superimposed on stage 3 chronic kidney disease (HCC) 02/07/2015  . Syncope 02/07/2015  . Normocytic anemia 02/07/2015  . Hyperkalemia 02/07/2015  . Diabetes mellitus without complication (HCC)   . paroxysmal afib   . Absolute anemia   . Faintness   . Absent pedal pulses 12/28/2014  . Essential hypertension, benign 06/27/2012  . Proximal humerus fracture, right 06/24/2012    [facility] CenterCHINKE,Elisabeth Strom ,MS, CCC-SLP  01/09/2017, 12:51 PM  [facility] [address] [place], [place], [address] Phone: [phone]   Fax:  [phone]   Name: Clayton [name] MRN: [id number] Date of Birth: May 14, 1919

## 2017-01-09 NOTE — Patient Instructions (Signed)
Handout provided last session was re-printed and provided to pt today.

## 2017-01-14 ENCOUNTER — Encounter: Payer: Medicare (Managed Care) | Admitting: Speech Pathology

## 2017-01-14 ENCOUNTER — Ambulatory Visit: Payer: Medicare (Managed Care) | Admitting: Speech Pathology

## 2017-01-16 ENCOUNTER — Ambulatory Visit: Payer: Medicare (Managed Care) | Admitting: Speech Pathology

## 2017-01-21 ENCOUNTER — Ambulatory Visit: Payer: Medicare (Managed Care) | Admitting: Speech Pathology

## 2017-01-21 DIAGNOSIS — R1312 Dysphagia, oropharyngeal phase: Secondary | ICD-10-CM

## 2017-01-21 DIAGNOSIS — R4701 Aphasia: Secondary | ICD-10-CM

## 2017-01-21 DIAGNOSIS — R471 Dysarthria and anarthria: Secondary | ICD-10-CM | POA: Diagnosis not present

## 2017-01-21 NOTE — Therapy (Signed)
[facility] [address] [place], [place], [address] Phone: [phone]   Fax:  [phone]  Speech Language Pathology Treatment  Patient Details  Name: Clayton [name] MRN: [id number] Date of Birth: June 09, 1919 Referring Provider: Dr. Creola Corn  Encounter Date: 01/21/2017      End of Session - 01/21/17 1535    Visit Number 7   Number of Visits 17   Date for SLP Re-Evaluation 02/07/17   SLP Start Time 1103   SLP Stop Time  1148   SLP Time Calculation (min) 45 min   Activity Tolerance Patient tolerated treatment well      Past Medical History:  Diagnosis Date  . Anemia   . Cataracts, bilateral   . CKD (chronic kidney disease), stage III   . Diabetes mellitus without complication (HCC)   . Distended bladder   . Diverticulosis    on scan  . Hard of hearing   . Hiatal hernia   . Hypertension   . Kidney stones   . Macular degeneration   . paroxysmal afib    right bundle branch block  . Syncopal episodes     Past Surgical History:  Procedure Laterality Date  . EYE SURGERY     cataracts with lens implants  . HERNIA REPAIR    . KIDNEY STONE SURGERY     since young age  . ORIF HUMERUS FRACTURE Right 06/26/2012   Procedure: OPEN REDUCTION INTERNAL FIXATION (ORIF) PROXIMAL HUMERUS FRACTURE, right ;  Surgeon: Budd Palmer, MD;  Location: [facility];  Service: Orthopedics;  Laterality: Right;  . TRANSURETHRAL RESECTION OF PROSTATE N/A 06/30/2015   Procedure: TRANSURETHRAL RESECTION OF THE PROSTATE (TURP);  Surgeon: Crist Fat, MD;  Location: [facility];  Service: Urology;  Laterality: N/A;    There were no vitals filed for this visit.      Subjective Assessment - 01/21/17 1523    Subjective "I have been understanding him better, so we are focusing on swallowing"   Patient is accompained by: Family member   Special Tests daughter, [name]   Currently in Pain? No/denies               ADULT SLP  TREATMENT - 01/21/17 1134      General Information   Behavior/Cognition Alert;Cooperative;Hard of hearing;Requires cueing     Treatment Provided   Treatment provided Dysphagia;Cognitive-Linquistic     Dysphagia Treatment   Temperature Spikes Noted No   Respiratory Status Room air   Treatment Methods Therapeutic exercise;Patient/caregiver education;Compensation strategy training   Type of cueing Verbal;Tactile   Amount of cueing Moderate   Other treatment/comments Pt required frequent mod verbal cues to complete dysphagia HEP. He completed 4 Masako exercises spread out during session.  Daughter verbalized diet modifications and swallow precautions with min A. Pt required frequent cues to clear his throat and swallow hard when hyrdophonia present.      Cognitive-Linquistic Treatment   Treatment focused on Dysarthria   Skilled Treatment Initially, pt with limited and inaudible verbal utterances. Performed HEP for dysarthria with frequent min to mod A. After HEP, pt's voice improved to be low but audible. Pt named 3-4 items for a category with usual mod semantic and questioning cues. He complete lyrics to common songs with mod A. Daughter reports she can hear her father better more recently.     Assessment / Recommendations / Plan   Plan Continue with current plan of care     Dysphagia Recommendations  Diet recommendations Dysphagia 1 (puree);Honey-thick liquid   Liquids provided via Cup   Medication Administration Crushed with puree   Supervision Full supervision/cueing for compensatory strategies   Compensations Slow rate;Small sips/bites;Clear throat after each swallow   Postural Changes and/or Swallow Maneuvers Seated upright 90 degrees;Upright 30-60 min after meal          SLP Education - 01/21/17 1528    Education provided Yes   Education Details diet modificiations, swallow precautions, continue HEP for speech and swallowing   Person(s) Educated Patient;Child(ren)   Methods  Explanation;Demonstration;Verbal cues   Comprehension Verbalized understanding;Returned demonstration;Verbal cues required;Tactile cues required;Need further instruction          SLP Short Term Goals - 01/21/17 1532      SLP SHORT TERM GOAL #1   Title Pt will perform HEP for dysarthria and dyphagia with occasional mod A   Time 1   Period Weeks   Status Revised     SLP SHORT TERM GOAL #2   Title Pt will name 6 items in a category with occasional mod A   Time 1   Status On-going     SLP SHORT TERM GOAL #3   Title Pt will respond to structured tasks  audibly with 2-4 word utterances 8/10 trials with occasional min A   Time 2   Period Weeks   Status Achieved     SLP SHORT TERM GOAL #4   Title Pt will utilized adequate breath support for speech in structured tasks and social responses 80% of opportunities with occasiona min A   Time 1   Period Weeks   Status On-going          SLP Long Term Goals - 01/21/17 1533      SLP LONG TERM GOAL #1   Title Pt will follow swallow precautions with usual mod A   Time 5   Period Weeks   Status Revised     SLP LONG TERM GOAL #2   Title Pt will respond in conversation with 3-4 words and 90% intellgibility with occasional min A   Time 5   Period Weeks   Status On-going     SLP LONG TERM GOAL #3   Title Pt will utilize compensations for aphasia at sentence level with occasional min A   Time 5   Period Weeks   Status On-going          Plan - 01/21/17 1529    Clinical Impression Statement Pt continues to required mod to max A for completion of HEP for dysarthria and dysphagia. Daughter educated re: diet modifications and s/s of aspiration. Divergent naming and response to simple recall questions with mod A for volume and intelliblity. Continue skilled ST to maximize intelligibility and safety of swallow for QOL and to reduce caregiver (daughter) burden.    Speech Therapy Frequency 2x / week   Treatment/Interventions Aspiration  precaution training;Pharyngeal strengthening exercises;Diet toleration management by SLP;Language facilitation;Compensatory techniques;Environmental controls;Cognitive reorganization;Functional tasks;SLP instruction and feedback;Compensatory strategies;Patient/family education   Potential to Achieve Goals Fair   Potential Considerations Previous level of function   SLP Home Exercise Plan Loud /a/, loud hey, functional phrases, naming      Patient will benefit from skilled therapeutic intervention in order to improve the following deficits and impairments:   Dysarthria and anarthria  Aphasia  Dysphagia, oropharyngeal phase    Problem List Patient Active Problem List   Diagnosis Date Noted  . Hyperlipemia 10/24/2016  . Aphasia   .  Aortic atherosclerosis (HCC)   . CVA (cerebral vascular accident) (HCC) 07/31/2016  . CKD (chronic kidney disease), stage III 07/31/2016  . Urinary retention 06/30/2015  . AKI (acute kidney injury) (HCC) 05/22/2015  . CAP (community acquired pneumonia) 03/03/2015  . Acute renal failure superimposed on stage 3 chronic kidney disease (HCC) 02/07/2015  . Syncope 02/07/2015  . Normocytic anemia 02/07/2015  . Hyperkalemia 02/07/2015  . Diabetes mellitus without complication (HCC)   . paroxysmal afib   . Absolute anemia   . Faintness   . Absent pedal pulses 12/28/2014  . Essential hypertension, benign 06/27/2012  . Proximal humerus fracture, right 06/24/2012    Kanchan Gal, Radene Journey MS, CCC-SLP 01/21/2017, 3:36 PM  [facility] [address] [place], [place], [address] Phone: [phone]   Fax:  [phone]   Name: Clayton [name] MRN: [id number] Date of Birth: [birth year]

## 2017-01-23 ENCOUNTER — Ambulatory Visit: Payer: Medicare (Managed Care) | Admitting: Speech Pathology

## 2017-01-23 DIAGNOSIS — R471 Dysarthria and anarthria: Secondary | ICD-10-CM

## 2017-01-23 DIAGNOSIS — R1312 Dysphagia, oropharyngeal phase: Secondary | ICD-10-CM

## 2017-01-23 DIAGNOSIS — R4701 Aphasia: Secondary | ICD-10-CM

## 2017-01-23 NOTE — Therapy (Signed)
[facility] [address] [place], [place], [address] Phone: [phone]   Fax:  [phone]  Speech Language Pathology Treatment  Patient Details  Name: Clayton [name] MRN: [id number] Date of Birth: August 08, 1919 Referring Provider: Dr. Creola Corn  Encounter Date: 01/23/2017      End of Session - 01/23/17 1215    Visit Number 8   Number of Visits 17   Date for SLP Re-Evaluation 02/07/17   SLP Start Time 1018   SLP Stop Time  1102   SLP Time Calculation (min) 44 min   Activity Tolerance Patient tolerated treatment well      Past Medical History:  Diagnosis Date  . Anemia   . Cataracts, bilateral   . CKD (chronic kidney disease), stage III   . Diabetes mellitus without complication (HCC)   . Distended bladder   . Diverticulosis    on scan  . Hard of hearing   . Hiatal hernia   . Hypertension   . Kidney stones   . Macular degeneration   . paroxysmal afib    right bundle branch block  . Syncopal episodes     Past Surgical History:  Procedure Laterality Date  . EYE SURGERY     cataracts with lens implants  . HERNIA REPAIR    . KIDNEY STONE SURGERY     since young age  . ORIF HUMERUS FRACTURE Right 06/26/2012   Procedure: OPEN REDUCTION INTERNAL FIXATION (ORIF) PROXIMAL HUMERUS FRACTURE, right ;  Surgeon: Budd Palmer, MD;  Location: [facility];  Service: Orthopedics;  Laterality: Right;  . TRANSURETHRAL RESECTION OF PROSTATE N/A 06/30/2015   Procedure: TRANSURETHRAL RESECTION OF THE PROSTATE (TURP);  Surgeon: Crist Fat, MD;  Location: [facility];  Service: Urology;  Laterality: N/A;    There were no vitals filed for this visit.      Subjective Assessment - 01/23/17 1021    Subjective "I've been doing them alright"   Patient is accompained by: Family member   Special Tests daughter, [name]   Currently in Pain? No/denies               ADULT SLP TREATMENT - 01/23/17 1028      General  Information   Behavior/Cognition Alert;Cooperative;Hard of hearing;Requires cueing     Treatment Provided   Treatment provided Dysphagia;Cognitive-Linquistic     Dysphagia Treatment   Temperature Spikes Noted No   Respiratory Status Room air   Treatment Methods Therapeutic exercise;Patient/caregiver education;Compensation strategy training   Feeding Able to feed self   Liquids provided via Cup   Pharyngeal Phase Signs & Symptoms Immediate cough  1/5 sips   Type of cueing Verbal   Amount of cueing --  Min to mod   Other treatment/comments Pt with improvement today - is on abx due to UTI. Pt required occasional min to mod A for swallow HEP. He performs Masako best when broken up into 2 at a time. Added pitch glids and /k,g/ phrases to HEP.      Cognitive-Linquistic Treatment   Treatment focused on Dysarthria;Aphasia   Skilled Treatment Convergent naming with occasional min to mod A and extended time. Min cues for breath support for volume - pt spoke in 4-5 word utterances today 4x, with rare min A for breath support     Assessment / Recommendations / Plan   Plan Continue with current plan of care     Dysphagia Recommendations   Diet recommendations Dysphagia 1 (puree);Honey-thick liquid  Liquids provided via Cup   Medication Administration Crushed with puree   Supervision Full supervision/cueing for compensatory strategies   Compensations Slow rate;Small sips/bites;Clear throat after each swallow   Postural Changes and/or Swallow Maneuvers Seated upright 90 degrees;Upright 30-60 min after meal     Progression Toward Goals   Progression toward goals Progressing toward goals          SLP Education - 01/23/17 1103    Education provided Yes   Education Details additions to HEP for swallowing, continue HEP at home at leats twice a day   Person(s) Educated Patient;Spouse   Methods Explanation;Demonstration;Verbal cues   Comprehension Verbalized understanding;Returned  demonstration;Verbal cues required          SLP Short Term Goals - 01/23/17 1215      SLP SHORT TERM GOAL #1   Title Pt will perform HEP for dysarthria and dyphagia with occasional mod A   Time 1   Period Weeks   Status Revised     SLP SHORT TERM GOAL #2   Title Pt will name 6 items in a category with occasional mod A   Time 1   Status On-going     SLP SHORT TERM GOAL #3   Title Pt will respond to structured tasks  audibly with 2-4 word utterances 8/10 trials with occasional min A   Time 2   Period Weeks   Status Achieved     SLP SHORT TERM GOAL #4   Title Pt will utilized adequate breath support for speech in structured tasks and social responses 80% of opportunities with occasiona min A   Time 1   Period Weeks   Status On-going          SLP Long Term Goals - 01/23/17 1215      SLP LONG TERM GOAL #1   Title Pt will follow swallow precautions with usual mod A   Time 5   Period Weeks   Status Revised     SLP LONG TERM GOAL #2   Title Pt will respond in conversation with 3-4 words and 90% intellgibility with occasional min A   Time 5   Period Weeks   Status On-going     SLP LONG TERM GOAL #3   Title Pt will utilize compensations for aphasia at sentence level with occasional min A   Time 5   Period Weeks   Status On-going          Plan - 01/23/17 1211    Clinical Impression Statement Daughter reports that pt "joins in the conversation more" since starting ST. Pt with improvement in utterance length today, as well as intelligible responses to social greetings. Improvement in performance HEP today, pt completed 5-7 reps of dysphagia exercises with occasional min to mod A. Rare min A for breath support for phonation. Pt breathing when he experiences aphonia/whisper to generate audible voice. Continue skilled ST to maximize intellgibility and safey of swallow to reduce caregiver burden.   Speech Therapy Frequency 2x / week   Treatment/Interventions Aspiration  precaution training;Pharyngeal strengthening exercises;Diet toleration management by SLP;Language facilitation;Compensatory techniques;Environmental controls;Cognitive reorganization;Functional tasks;SLP instruction and feedback;Compensatory strategies;Patient/family education   Potential to Achieve Goals Fair   Potential Considerations Previous level of function   Consulted and Agree with Plan of Care Patient;Family member/caregiver   Family Member Leticia Clas, daughter, spouse Carney Bern      Patient will benefit from skilled therapeutic intervention in order to improve the following deficits and impairments:   Dysarthria  and anarthria  Aphasia  Dysphagia, oropharyngeal phase    Problem List Patient Active Problem List   Diagnosis Date Noted  . Hyperlipemia 10/24/2016  . Aphasia   . Aortic atherosclerosis (HCC)   . CVA (cerebral vascular accident) (HCC) 07/31/2016  . CKD (chronic kidney disease), stage III 07/31/2016  . Urinary retention 06/30/2015  . AKI (acute kidney injury) (HCC) 05/22/2015  . CAP (community acquired pneumonia) 03/03/2015  . Acute renal failure superimposed on stage 3 chronic kidney disease (HCC) 02/07/2015  . Syncope 02/07/2015  . Normocytic anemia 02/07/2015  . Hyperkalemia 02/07/2015  . Diabetes mellitus without complication (HCC)   . paroxysmal afib   . Absolute anemia   . Faintness   . Absent pedal pulses 12/28/2014  . Essential hypertension, benign 06/27/2012  . Proximal humerus fracture, right 06/24/2012    Vernis Eid, Radene Journey MS, CCC-SLP 01/23/2017, 12:16 PM  [facility] [address] [place], [place], [address] Phone: [phone]   Fax:  [phone]   Name: PARMVIR BOOMER MRN: [id number] Date of Birth: 15-Sep-1919

## 2017-01-23 NOTE — Patient Instructions (Signed)
  Add pitch glide 5x 2 to 3 x a day  Kick the can  [facility]  Call the cat Lauro Regulus the [employer] the kids  [employer] loud voice with

## 2017-01-28 ENCOUNTER — Ambulatory Visit: Payer: Medicare (Managed Care)

## 2017-01-28 DIAGNOSIS — R471 Dysarthria and anarthria: Secondary | ICD-10-CM | POA: Diagnosis not present

## 2017-01-28 DIAGNOSIS — R1312 Dysphagia, oropharyngeal phase: Secondary | ICD-10-CM

## 2017-01-28 DIAGNOSIS — R4701 Aphasia: Secondary | ICD-10-CM

## 2017-01-28 NOTE — Patient Instructions (Signed)
Continue to work on your speech exercises and swallowing exercises

## 2017-01-28 NOTE — Therapy (Signed)
[facility] [address] [place], [place], [address] Phone: [phone]   Fax:  [phone]  Speech Language Pathology Treatment  Patient Details  Name: Clayton [name] MRN: [id number] Date of Birth: 06-20-1919 Referring Provider: Dr. Shon Baton  Encounter Date: 01/28/2017      End of Session - 01/28/17 1723    Visit Number 9   Number of Visits 17   Date for SLP Re-Evaluation 02/07/17   SLP Start Time 1404   SLP Stop Time  1445   SLP Time Calculation (min) 41 min   Activity Tolerance Patient tolerated treatment well      Past Medical History:  Diagnosis Date  . Anemia   . Cataracts, bilateral   . CKD (chronic kidney disease), stage III   . Diabetes mellitus without complication ([place])   . Distended bladder   . Diverticulosis    on scan  . Hard of hearing   . Hiatal hernia   . Hypertension   . Kidney stones   . Macular degeneration   . paroxysmal afib    right bundle branch block  . Syncopal episodes     Past Surgical History:  Procedure Laterality Date  . EYE SURGERY     cataracts with lens implants  . HERNIA REPAIR    . KIDNEY STONE SURGERY     since young age  . ORIF HUMERUS FRACTURE Right 06/26/2012   Procedure: OPEN REDUCTION INTERNAL FIXATION (ORIF) PROXIMAL HUMERUS FRACTURE, right ;  Surgeon: Rozanna Box, MD;  Location: [facility];  Service: Orthopedics;  Laterality: Right;  . TRANSURETHRAL RESECTION OF PROSTATE N/A 06/30/2015   Procedure: TRANSURETHRAL RESECTION OF THE PROSTATE (TURP);  Surgeon: Ardis Hughs, MD;  Location: [facility];  Service: Urology;  Laterality: N/A;    There were no vitals filed for this visit.             ADULT SLP TREATMENT - 01/28/17 0001      General Information   Behavior/Cognition Alert;Cooperative;Hard of hearing;Requires cueing     Treatment Provided   Treatment provided Cognitive-Linquistic;Dysphagia     Dysphagia Treatment   Temperature  Spikes Noted No   Respiratory Status Room air   Treatment Methods Therapeutic exercise;Patient/caregiver education;Skilled observation   Liquids provided via --   Type of cueing Verbal;Visual   Amount of cueing Moderate  occasional initially, faded to min-mod occasional   Other treatment/comments Pt reports hearing aids aren't working (to daughter). Daughter reports pt has been 'a little better" with getting HEP done more routinely. Today SLP was req'd to complete no more than 2 [place] reps at a time (with extra time), with speech exercises peppered in between reps. SLP told daughter and pt that reps should be completed as close together as possible to promote muscle strengthening.      Cognitive-Linquistic Treatment   Treatment focused on Dysarthria;Aphasia   Skilled Treatment Convergent naming (3-4 items) with usual mod A and extended time. Mod cues occasionally for breath support for volume.     Assessment / Recommendations / Plan   Plan Continue with current plan of care     Dysphagia Recommendations   Diet recommendations Dysphagia 1 (puree);Honey-thick liquid   Liquids provided via Cup   Medication Administration Crushed with puree   Supervision Full supervision/cueing for compensatory strategies   Compensations Slow rate;Small sips/bites;Clear throat after each swallow   Postural Changes and/or Swallow Maneuvers Seated upright 90 degrees;Upright 30-60 min after meal  Progression Toward Goals   Progression toward goals Progressing toward goals            SLP Short Term Goals - 01/28/17 1725      SLP SHORT TERM GOAL #1   Title Pt will perform HEP for dysarthria and dyphagia with occasional mod A   Status Partially Met     SLP SHORT TERM GOAL #2   Title Pt will name 6 items in a category with occasional mod A   Status Partially Met     SLP SHORT TERM GOAL #3   Title Pt will respond to structured tasks  audibly with 2-4 word utterances 8/10 trials with occasional min  A   Status Achieved     SLP SHORT TERM GOAL #4   Title Pt will utilized adequate breath support for speech in structured tasks and social responses 80% of opportunities with occasional min A   Status Partially Met          SLP Long Term Goals - 01/28/17 1725      SLP LONG TERM GOAL #1   Title Pt will follow swallow precautions with usual mod A   Time 4   Period Weeks   Status Revised     SLP LONG TERM GOAL #2   Title Pt will respond in conversation with 3-4 words and 90% intellgibility with occasional min A   Time 4   Period Weeks   Status On-going     SLP LONG TERM GOAL #3   Title Pt will utilize compensations for aphasia at sentence level with occasional min A   Time 4   Period Weeks   Status On-going          Plan - 01/28/17 1723    Clinical Impression Statement Daughter cont to report that pt talks more frequently and his intelligibility has improved since starting ST. See "skilled intervention" and "other comments" above for further details. Continue skilled ST to maximize intellgibility and safey of swallow to reduce caregiver burden.   Speech Therapy Frequency 2x / week   Treatment/Interventions Aspiration precaution training;Pharyngeal strengthening exercises;Diet toleration management by SLP;Language facilitation;Compensatory techniques;Environmental controls;Cognitive reorganization;Functional tasks;SLP instruction and feedback;Compensatory strategies;Patient/family education   Potential to Achieve Goals Fair   Potential Considerations Previous level of function   Consulted and Agree with Plan of Care Patient;Family member/caregiver   Family Member Leticia Clas, daughter, spouse Carney Bern      Patient will benefit from skilled therapeutic intervention in order to improve the following deficits and impairments:   Dysarthria and anarthria  Dysphagia, oropharyngeal phase  Aphasia    Problem List Patient Active Problem List   Diagnosis Date Noted  .  Hyperlipemia 10/24/2016  . Aphasia   . Aortic atherosclerosis (HCC)   . CVA (cerebral vascular accident) (HCC) 07/31/2016  . CKD (chronic kidney disease), stage III 07/31/2016  . Urinary retention 06/30/2015  . AKI (acute kidney injury) (HCC) 05/22/2015  . CAP (community acquired pneumonia) 03/03/2015  . Acute renal failure superimposed on stage 3 chronic kidney disease (HCC) 02/07/2015  . Syncope 02/07/2015  . Normocytic anemia 02/07/2015  . Hyperkalemia 02/07/2015  . Diabetes mellitus without complication (HCC)   . paroxysmal afib   . Absolute anemia   . Faintness   . Absent pedal pulses 12/28/2014  . Essential hypertension, benign 06/27/2012  . Proximal humerus fracture, right 06/24/2012    [facility] ,MS, CCC-SLP  01/28/2017, 5:26 PM  [facility] [address]  [place], [place], [address] Phone: [phone]   Fax:  [phone]   Name: Clayton [name] MRN: [id number] Date of Birth: [birth year]

## 2017-01-30 ENCOUNTER — Ambulatory Visit: Payer: Medicare (Managed Care) | Admitting: Speech Pathology

## 2017-01-30 ENCOUNTER — Encounter: Payer: Medicare (Managed Care) | Admitting: Speech Pathology

## 2017-01-30 DIAGNOSIS — R471 Dysarthria and anarthria: Secondary | ICD-10-CM | POA: Diagnosis not present

## 2017-01-30 DIAGNOSIS — R4701 Aphasia: Secondary | ICD-10-CM

## 2017-01-30 DIAGNOSIS — R1312 Dysphagia, oropharyngeal phase: Secondary | ICD-10-CM

## 2017-01-30 NOTE — Therapy (Signed)
[facility] [address] [place], [place], [address] Phone: [phone]   Fax:  [phone]  Speech Language Pathology Treatment  Patient Details  Name: Clayton [name] MRN: [id number] Date of Birth: [birth year] Referring Provider: Dr. Shon Baton  Encounter Date: 01/30/2017      End of Session - 01/30/17 1219    Visit Number 10   Number of Visits 17   Date for SLP Re-Evaluation 02/07/17   SLP Start Time 1016   SLP Stop Time  1102   SLP Time Calculation (min) 46 min   Activity Tolerance Patient tolerated treatment well      Past Medical History:  Diagnosis Date  . Anemia   . Cataracts, bilateral   . CKD (chronic kidney disease), stage III   . Diabetes mellitus without complication ([place])   . Distended bladder   . Diverticulosis    on scan  . Hard of hearing   . Hiatal hernia   . Hypertension   . Kidney stones   . Macular degeneration   . paroxysmal afib    right bundle branch block  . Syncopal episodes     Past Surgical History:  Procedure Laterality Date  . EYE SURGERY     cataracts with lens implants  . HERNIA REPAIR    . KIDNEY STONE SURGERY     since young age  . ORIF HUMERUS FRACTURE Right 06/26/2012   Procedure: OPEN REDUCTION INTERNAL FIXATION (ORIF) PROXIMAL HUMERUS FRACTURE, right ;  Surgeon: Rozanna Box, MD;  Location: [facility];  Service: Orthopedics;  Laterality: Right;  . TRANSURETHRAL RESECTION OF PROSTATE N/A 06/30/2015   Procedure: TRANSURETHRAL RESECTION OF THE PROSTATE (TURP);  Surgeon: Ardis Hughs, MD;  Location: [facility];  Service: Urology;  Laterality: N/A;    There were no vitals filed for this visit.      Subjective Assessment - 01/30/17 1021    Subjective "He sounds much better - his voice sound clear and less gurgly"   Patient is accompained by: Family member   Special Tests daughter, Margarita Grizzle   Currently in Pain? No/denies               ADULT SLP TREATMENT  - 01/30/17 1048      General Information   Behavior/Cognition Alert;Cooperative;Hard of hearing;Requires cueing     Treatment Provided   Treatment provided Cognitive-Linquistic;Dysphagia     Dysphagia Treatment   Temperature Spikes Noted No   Respiratory Status Room air   Treatment Methods Therapeutic exercise;Patient/caregiver education;Skilled observation   Feeding Able to feed self   Liquids provided via Cup   Type of cueing Verbal;Visual   Amount of cueing Moderate   Other treatment/comments Pt continues to required ongoing cueing and extended time to complete HEP. Daughter has been trained on how to cue pt. She reports that she has trained Mr. Schmale morning caretgiver also      Cognitive-Linquistic Treatment   Treatment focused on Dysarthria;Aphasia   Skilled Treatment Convergent naming with occasional min A for 3-4 items, divergent naming mod A for 3-4 items. Pt required occasional min A to use breath support to speak audible.      Assessment / Recommendations / Plan   Plan Continue with current plan of care     Dysphagia Recommendations   Diet recommendations Dysphagia 1 (puree);Honey-thick liquid   Liquids provided via Cup   Medication Administration Crushed with puree   Supervision Full supervision/cueing for compensatory strategies   Compensations  Slow rate;Small sips/bites;Clear throat after each swallow   Postural Changes and/or Swallow Maneuvers Seated upright 90 degrees;Upright 30-60 min after meal     Progression Toward Goals   Progression toward goals Not progressing toward goals (comment)  will continue to require ongoing cues from caregivers, d/c p          SLP Education - 01/30/17 1214    Education provided Yes   Education Details continue HEP, language activities and swallow precautions upon d/c of ST   Person(s) Educated Patient;Child(ren)   Methods Explanation;Demonstration   Comprehension Verbalized understanding;Returned demonstration      SPEECH THERAPY DISCHARGE SUMMARY  Visits from Start of Care: 10  Current functional level related to goals / functional outcomes: See goals below   Remaining deficits: Dysphagia, dysarthria, aphasia   Education / Equipment: HEP for dysphagia, swallow precautions, s/s of aspiration pna, HEP for dysarthria, compensations for dysarthria and aphasia, family educated on cueing pt for in these areas Plan: Patient agrees to discharge.  Patient goals were partially met. Patient is being discharged due to being pleased with the current functional level.  ?????          SLP Short Term Goals - 01/30/17 1219      SLP SHORT TERM GOAL #1   Title Pt will perform HEP for dysarthria and dyphagia with occasional mod A   Status Partially Met     SLP SHORT TERM GOAL #2   Title Pt will name 6 items in a category with occasional mod A   Status Partially Met     SLP SHORT TERM GOAL #3   Title Pt will respond to structured tasks  audibly with 2-4 word utterances 8/10 trials with occasional min A   Status Achieved     SLP SHORT TERM GOAL #4   Title Pt will utilized adequate breath support for speech in structured tasks and social responses 80% of opportunities with occasional min A   Status Partially Met          SLP Long Term Goals - 01/30/17 1219      SLP LONG TERM GOAL #1   Title Pt will follow swallow precautions with usual mod A   Time 4   Period Weeks   Status Achieved     SLP LONG TERM GOAL #2   Title Pt will respond in conversation with 3-4 words and 90% intellgibility with occasional min A   Time 4   Period Weeks   Status Partially Met     SLP LONG TERM GOAL #3   Title Pt will utilize compensations for aphasia at sentence level with occasional min A   Time 4   Period Weeks   Status Not Met          Plan - 01/30/17 1217    Clinical Impression Statement Daughter continues to be pleased with being able to understand her father much better. Pt will continue to need  ongoing cueing for HEP, swallow precautions and intellgibility. Daughter has been trained on how to cue pt. She is pleased with his current level of function. Daughter and pt are aware that pt is aspirating and is at risk for apsiration pna. D/C ST at this time.    Speech Therapy Frequency 2x / week   Treatment/Interventions Aspiration precaution training;Pharyngeal strengthening exercises;Diet toleration management by SLP;Language facilitation;Compensatory techniques;Environmental controls;Cognitive reorganization;Functional tasks;SLP instruction and feedback;Compensatory strategies;Patient/family education   Potential to Achieve Goals Fair   Consulted and Agree with Plan of  Care Patient;Family member/caregiver   Family Member Loma Messing, daughter      Patient will benefit from skilled therapeutic intervention in order to improve the following deficits and impairments:   Dysphagia, oropharyngeal phase  Aphasia  Dysarthria and anarthria      G-Codes - 02/24/2017 1220    Functional Assessment Tool Used ASHA NOMS   Spoken Language Expression Current Status [phone]) At least 40 percent but less than 60 percent impaired, limited or restricted   Spoken Language Expression Goal Status (K1031) At least 40 percent but less than 60 percent impaired, limited or restricted      Problem List Patient Active Problem List   Diagnosis Date Noted  . Hyperlipemia 10/24/2016  . Aphasia   . Aortic atherosclerosis ([place])   . CVA (cerebral vascular accident) ([place]) 07/31/2016  . CKD (chronic kidney disease), stage III 07/31/2016  . Urinary retention 06/30/2015  . AKI (acute kidney injury) ([place]) 05/22/2015  . CAP (community acquired pneumonia) 03/03/2015  . Acute renal failure superimposed on stage 3 chronic kidney disease ([place]) 02/07/2015  . Syncope 02/07/2015  . Normocytic anemia 02/07/2015  . Hyperkalemia 02/07/2015  . Diabetes mellitus without complication ([place])   . paroxysmal afib   .  Absolute anemia   . Faintness   . Absent pedal pulses 12/28/2014  . Essential hypertension, benign 06/27/2012  . Proximal humerus fracture, right 06/24/2012    Lovvorn, Annye Rusk  MS, CCC-SLP 02-24-17, 12:21 PM  [facility] [address] [place], [place], [address] Phone: [phone]   Fax:  [phone]   Name: Clayton [name] MRN: [id number] Date of Birth: 1919/06/05

## 2017-02-10 ENCOUNTER — Ambulatory Visit: Payer: Self-pay | Admitting: Podiatry

## 2017-03-04 ENCOUNTER — Ambulatory Visit (INDEPENDENT_AMBULATORY_CARE_PROVIDER_SITE_OTHER): Payer: Medicare (Managed Care) | Admitting: Podiatry

## 2017-03-04 ENCOUNTER — Encounter: Payer: Self-pay | Admitting: Podiatry

## 2017-03-04 DIAGNOSIS — I739 Peripheral vascular disease, unspecified: Secondary | ICD-10-CM

## 2017-03-04 DIAGNOSIS — B351 Tinea unguium: Secondary | ICD-10-CM | POA: Diagnosis not present

## 2017-03-04 NOTE — Progress Notes (Signed)
Patient ID: Clayton SkyeWilliam G [name], male   DOB: 1919/05/28, 83 y.o.   MRN: [id number]    Subjective: This patient presents with daughter and treatment room who is requesting debridement of her father's toenails. She is concerned the toenails are thickened and elongated.  Objective: Post CVA with speech impairment Patient is visually impaired Patient does respond to questioning DP and PT pulses nonpalpable bilaterally Capillary reflex immediate bilaterally Sensation to 10 g monofilament wire intact 4/5 bilaterally Vibratory sensation reactive bilaterally Ankle reflexes reactive bilaterally Patient walks slowly with a roller walker No open skin lesions bilaterally Atrophic skin with absent hair growth bilaterally The toenails are elongated, brittle, deformed, hypertrophic 6-10  Assessment: Diabetic with peripheral arterial disease Absent pedal pulses bilaterally Protective sensation intact bilaterally Mycotic toenails 6-10  Plan: Debridement toenails 6-10 mechanically and electronically without any bleeding.   Reappoint 3 months

## 2017-05-11 NOTE — Progress Notes (Signed)
[facility]  PATIENT: Clayton [name] DOB: Aug 16, 1919   REASON FOR VISIT: follow-up for small left MCA territory infarcts embolic likely secondary to large vessel atherosclerosis HISTORY FROM: Patient,  and daughter    HISTORY OF PRESENT ILLNESS: FROM RECORDMr. [name] is a 84 year old male with a history of atrial fib not on anticoagulant, hypertension hyperlipidemia and diabetes presenting with aphasia. He did not receive IV TPA due to delay in arrival. MRI small left MCA territory infarcts likely embolic secondary to large vessel atherosclerosis. Patient had remote transient atrial fibrillation in the setting of acute pancreatitis therefore cardiac monitoring to rule out atrial fibrillation and. CTA head and neck enlarged vessel occlusion 2-D echo EF 55-60%. LDL 90. Hemoglobin A1c 7.2 he was placed on aspirin and Plavix for 3 months. Patient has a history of peripheral vascular disease followed  with Dr. Allyson Sabal. Interval history 06/21/2018CM patient returns to the stroke clinic for hospital follow-up. He was in skilled nursing care for approximately a week and the family brought him home. He was in hospice care for a while and he was taken off of all of his medications except Norvasc and aspirin. Hospice is no longer involved. He is now getting physical therapy and speech therapy in home. Occupational therapy has concluded. According to the daughter he is eating what he wants to basically a soft diet he has not had any choking spells. He has not had cardiac event monitoring and they do not feel this is necessary. They are willing to place him back on Plavix and Lipitor. He is ambulating with a walker. No recent falls blood pressure in the office today 125/70. He returns for reevaluation UPDATE 1/7/2019CM Clayton [name], 84 year old male returns for follow-up with history of small left MCA territory infarct likely secondary to large vessel atherosclerosis.  He is currently on Plavix  for secondary stroke prevention with minimal bruising and no bleeding.  He has not had further stroke or TIA symptoms.  He remains on Lipitor without myalgias.  He is diabetic and daughter says we let him eat what he wants to.  They do not check blood sugar.  He never had cardiac event monitoring as the daughter did not feel this was necessary given his age.  He continues to live at [facility] which is a senior living facility.  His meals are brought to his room instead of going down to the dining room.  He has an occasional fall due to macular degeneration.  He has not had any injuries he had aspiration pneumonia back in the summer.  He returns for reevaluation REVIEW OF SYSTEMS: Full 14 system review of systems performed and notable only for those listed, all others are neg:  Constitutional: neg  Cardiovascular: neg Ear/Nose/Throat: Hearing loss  Skin: neg Eyes: Macular degeneration Respiratory: neg Gastroitestinal:  Genitourinary incontinence of bladder at night, urinary frequency, TURP 2017 Hematology/Lymphatic: neg  Endocrine: neg Musculoskeletal:neg Allergy/Immunology: neg Neurological: Speech difficulty weakness Psychiatric: neg Sleep : neg   ALLERGIES: Allergies  Allergen Reactions  . Sulfa Antibiotics Itching and Rash    HOME MEDICATIONS: Outpatient Medications Prior to Visit  Medication Sig Dispense Refill  . amLODipine (NORVASC) 10 MG tablet Take 10 mg by mouth daily.    Marland Kitchen atorvastatin (LIPITOR) 20 MG tablet Take 1 tablet (20 mg total) by mouth daily at 6 PM. 30 tablet 6  . clopidogrel (PLAVIX) 75 MG tablet Take 1 tablet (75 mg total) by mouth daily. 30 tablet 6  . metFORMIN (  GLUCOPHAGE-XR) 500 MG 24 hr tablet Take 500 mg by mouth daily with breakfast.    . polyethylene glycol (MIRALAX / GLYCOLAX) packet Take 17 g by mouth daily as needed for mild constipation. 14 each 0   No facility-administered medications prior to visit.     PAST MEDICAL HISTORY: Past Medical  History:  Diagnosis Date  . Anemia   . Cataracts, bilateral   . CKD (chronic kidney disease), stage III (HCC)   . Diabetes mellitus without complication (HCC)   . Distended bladder   . Diverticulosis    on scan  . Hard of hearing   . Hiatal hernia   . Hypertension   . Kidney stones   . Macular degeneration   . paroxysmal afib    right bundle branch block  . Stroke (HCC)   . Syncopal episodes     PAST SURGICAL HISTORY: Past Surgical History:  Procedure Laterality Date  . EYE SURGERY     cataracts with lens implants  . HERNIA REPAIR    . KIDNEY STONE SURGERY     since young age  . ORIF HUMERUS FRACTURE Right 06/26/2012   Procedure: OPEN REDUCTION INTERNAL FIXATION (ORIF) PROXIMAL HUMERUS FRACTURE, right ;  Surgeon: Budd PalmerMichael H Handy, MD;  Location: [facility] OR;  Service: Orthopedics;  Laterality: Right;  . TRANSURETHRAL RESECTION OF PROSTATE N/A 06/30/2015   Procedure: TRANSURETHRAL RESECTION OF THE PROSTATE (TURP);  Surgeon: Crist FatBenjamin W Herrick, MD;  Location: [facility] ORS;  Service: Urology;  Laterality: N/A;    FAMILY HISTORY: Family History  Problem Relation Age of Onset  . Aneurysm Father        Aortic aneurysm  . Macular degeneration Mother   . Gallbladder disease Mother   . Pancreatitis Sister   . Macular degeneration Sister        x 4    SOCIAL HISTORY: Social History   Socioeconomic History  . Marital status: Married    Spouse name: Not on file  . Number of children: 3  . Years of education: Not on file  . Highest education level: Not on file  Social Needs  . Financial resource strain: Not on file  . Food insecurity - worry: Not on file  . Food insecurity - inability: Not on file  . Transportation needs - medical: Not on file  . Transportation needs - non-medical: Not on file  Occupational History  . Occupation: retired  Tobacco Use  . Smoking status: Former Smoker    Types: Cigars    Last attempt to quit: 05/06/1969    Years since quitting: 48.0  . Smokeless  tobacco: Never Used  Substance and Sexual Activity  . Alcohol use: No    Alcohol/week: 0.0 oz  . Drug use: No  . Sexual activity: Not on file  Other Topics Concern  . Not on file  Social History Narrative  . Not on file     PHYSICAL EXAM  Vitals:   05/12/17 1010  BP: 131/69  Pulse: 69  Weight: 128 lb (58.1 kg)   Body mass index is 23.41 kg/m.  Generalized: Well developed, in no acute distress  Head: normocephalic and atraumatic,. Oropharynx benign  Neck: Supple, no carotid bruits  Cardiac: Regular rate rhythm, no murmur  Musculoskeletal: No deformity   Neurological examination   Mentation: Alert oriented to time, place, history provided by daughter  Follows all commands, dysarthria and expressive aphasia.   Cranial nerve II-XII: Blinks to threat , extraocular movements were full, visual field  were full on confrontational test. hearing was intact to finger rubbing bilaterally. Uvula tongue midline. head turning and shoulder shrug were normal and symmetric.Tongue protrusion into cheek strength was normal. Motor: normal bulk and tone, full strength in the BUE, 4 out of 5 BLE,  Sensory: normal and symmetric to light touch, pinprick and vibratory to the upper and lower extremities Coordination: finger-nose-finger, , no dysmetria, no tremor Reflexes: 1+ upper lower and symmetric plantar responses were flexor bilaterally. Gait and Station: In wheelchair not ambulated  DIAGNOSTIC DATA (LABS, IMAGING, TESTING) - I reviewed patient records, labs, notes, testing and imaging myself where available.  Lab Results  Component Value Date   WBC 11.4 08/09/2016   HGB 15.0 08/09/2016   HCT 46 08/09/2016   MCV 95.9 08/05/2016   PLT 225 08/09/2016      Component Value Date/Time   NA 148 (A) 08/09/2016   K 4.5 08/09/2016   CL 109 08/05/2016 1456   CO2 23 08/05/2016 1456   GLUCOSE 122 (H) 08/05/2016 1456   BUN 33 (A) 08/09/2016   CREATININE 1.3 08/09/2016   CREATININE 1.35 (H)  08/05/2016 1456   CALCIUM 8.9 08/05/2016 1456   PROT 6.4 (L) 08/03/2016 0411   ALBUMIN 3.3 (L) 08/03/2016 0411   AST 17 08/09/2016   ALT 15 08/09/2016   ALKPHOS 82 08/09/2016   BILITOT 0.6 08/03/2016 0411   GFRNONAA 43 (L) 08/05/2016 1456   GFRAA 50 (L) 08/05/2016 1456   Lab Results  Component Value Date   CHOL 158 08/01/2016   HDL 41 08/01/2016   LDLCALC 90 08/01/2016   TRIG 134 08/01/2016   CHOLHDL 3.9 08/01/2016   Lab Results  Component Value Date   HGBA1C 7.2 (H) 08/01/2016   Lab Results  Component Value Date   VITAMINB12 354 05/11/2015       ASSESSMENT AND PLAN  84 y.o. year old male  has a past medical history of Anemia; Cataracts, bilateral; CKD (chronic kidney disease), stage III; Diabetes mellitus without complication (HCC); Distended bladder; Diverticulosis; Hard of hearing; Hiatal hernia; Hypertension; Kidney stones; Macular degeneration; paroxysmal afib; and Syncopal episodes. here For  follow-up small left MCA territory infarcts embolic likely secondary to large vessel atherosclerosis.  He remains on Plavix and Lipitor for secondary stroke prevention  PLAN: Stressed the importance of management of risk factors to prevent further stroke Continue  Plavix for secondary stroke prevention Maintain strict control of hypertension with blood pressure goal below 130/90, today's reading 131/60  continue Norvasc Control of diabetes with hemoglobin A1c below 6.5 followed by primary care continue diabetic medications Cholesterol with LDL cholesterol less than 70, followed by primary care, continue Lipitor  Continue getting up daily, be careful with ambulation  Continue to use rolling walker for safe ambulation at risk for falls Discharge from stroke clinic Daughter refused to have cardiac event monitor I spent 25 minutes in total face to face time with the patient /daughter more than 50% of which was spent counseling and coordination of care, reviewing test results  reviewing medications and discussing and reviewing the diagnosis of stroke and management of risk factors.  Written information was also reviewed with daughter Clayton [name], [facility], [facility], APRN  [facility] [address] [place], [place] [address] [phone]

## 2017-05-12 ENCOUNTER — Ambulatory Visit: Payer: Medicare Other | Admitting: Nurse Practitioner

## 2017-05-12 ENCOUNTER — Encounter: Payer: Self-pay | Admitting: Nurse Practitioner

## 2017-05-12 VITALS — BP 131/69 | HR 69 | Wt 128.0 lb

## 2017-05-12 DIAGNOSIS — R4701 Aphasia: Secondary | ICD-10-CM

## 2017-05-12 DIAGNOSIS — I10 Essential (primary) hypertension: Secondary | ICD-10-CM

## 2017-05-12 DIAGNOSIS — E785 Hyperlipidemia, unspecified: Secondary | ICD-10-CM | POA: Diagnosis not present

## 2017-05-12 DIAGNOSIS — I63312 Cerebral infarction due to thrombosis of left middle cerebral artery: Secondary | ICD-10-CM | POA: Diagnosis not present

## 2017-05-12 NOTE — Patient Instructions (Addendum)
Stressed the importance of management of risk factors to prevent further stroke Restart Plavix for secondary stroke prevention Maintain strict control of hypertension with blood pressure goal below 130/90, today's reading 131/60  continue Norvasc Control of diabetes with hemoglobin A1c below 6.5 followed by primary care continue diabetic medications Cholesterol with LDL cholesterol less than 70, followed by primary care, continue Lipitor  Continue getting up daily, be careful with ambulation  Continue to use rolling walker for safe ambulation at risk for falls Discharge from stroke clinic  Stroke Prevention Some medical conditions and behaviors are associated with a higher chance of having a stroke. You can help prevent a stroke by making nutrition, lifestyle, and other changes, including managing any medical conditions you may have. What nutrition changes can be made?  Eat healthy foods. You can do this by: ? Choosing foods high in fiber, such as fresh fruits and vegetables and whole grains. ? Eating at least 5 or more servings of fruits and vegetables a day. Try to fill half of your plate at each meal with fruits and vegetables. ? Choosing lean protein foods, such as lean cuts of meat, poultry without skin, fish, tofu, beans, and nuts. ? Eating low-fat dairy products. ? Avoiding foods that are high in salt (sodium). This can help lower blood pressure. ? Avoiding foods that have saturated fat, trans fat, and cholesterol. This can help prevent high cholesterol. ? Avoiding processed and premade foods.  Follow your health care provider's specific guidelines for losing weight, controlling high blood pressure (hypertension), lowering high cholesterol, and managing diabetes. These may include: ? Reducing your daily calorie intake. ? Limiting your daily sodium intake to 1,500 milligrams (mg). ? Using only healthy fats for cooking, such as olive oil, canola oil, or sunflower oil. ? Counting your  daily carbohydrate intake. What lifestyle changes can be made?  Maintain a healthy weight. Talk to your health care provider about your ideal weight.  Get at least 30 minutes of moderate physical activity at least 5 days a week. Moderate activity includes brisk walking, biking, and swimming.  Do not use any products that contain nicotine or tobacco, such as cigarettes and e-cigarettes. If you need help quitting, ask your health care provider. It may also be helpful to avoid exposure to secondhand smoke.  Limit alcohol intake to no more than 1 drink a day for nonpregnant women and 2 drinks a day for men. One drink equals 12 oz of beer, 5 oz of wine, or 1 oz of hard liquor.  Stop any illegal drug use.  Avoid taking birth control pills. Talk to your health care provider about the risks of taking birth control pills if: ? You are over 76 years old. ? You smoke. ? You get migraines. ? You have ever had a blood clot. What other changes can be made?  Manage your cholesterol levels. ? Eating a healthy diet is important for preventing high cholesterol. If cholesterol cannot be managed through diet alone, you may also need to take medicines. ? Take any prescribed medicines to control your cholesterol as told by your health care provider.  Manage your diabetes. ? Eating a healthy diet and exercising regularly are important parts of managing your blood sugar. If your blood sugar cannot be managed through diet and exercise, you may need to take medicines. ? Take any prescribed medicines to control your diabetes as told by your health care provider.  Control your hypertension. ? To reduce your risk of stroke, try  to keep your blood pressure below 130/80. ? Eating a healthy diet and exercising regularly are an important part of controlling your blood pressure. If your blood pressure cannot be managed through diet and exercise, you may need to take medicines. ? Take any prescribed medicines to  control hypertension as told by your health care provider. ? Ask your health care provider if you should monitor your blood pressure at home. ? Have your blood pressure checked every year, even if your blood pressure is normal. Blood pressure increases with age and some medical conditions.  Get evaluated for sleep disorders (sleep apnea). Talk to your health care provider about getting a sleep evaluation if you snore a lot or have excessive sleepiness.  Take over-the-counter and prescription medicines only as told by your health care provider. Aspirin or blood thinners (antiplatelets or anticoagulants) may be recommended to reduce your risk of forming blood clots that can lead to stroke.  Make sure that any other medical conditions you have, such as atrial fibrillation or atherosclerosis, are managed. What are the warning signs of a stroke? The warning signs of a stroke can be easily remembered as BEFAST.  B is for balance. Signs include: ? Dizziness. ? Loss of balance or coordination. ? Sudden trouble walking.  E is for eyes. Signs include: ? A sudden change in vision. ? Trouble seeing.  F is for face. Signs include: ? Sudden weakness or numbness of the face. ? The face or eyelid drooping to one side.  A is for arms. Signs include: ? Sudden weakness or numbness of the arm, usually on one side of the body.  S is for speech. Signs include: ? Trouble speaking (aphasia). ? Trouble understanding.  T is for time. ? These symptoms may represent a serious problem that is an emergency. Do not wait to see if the symptoms will go away. Get medical help right away. Call your local emergency services (911 in the [place] Do not drive yourself to the hospital.  Other signs of stroke may include: ? A sudden, severe headache with no known cause. ? Nausea or vomiting. ? Seizure.  Where to find more information: For more information, visit:  American Stroke Association:  www.strokeassociation.org  National Stroke Association: www.stroke.org  Summary  You can prevent a stroke by eating healthy, exercising, not smoking, limiting alcohol intake, and managing any medical conditions you may have.  Do not use any products that contain nicotine or tobacco, such as cigarettes and e-cigarettes. If you need help quitting, ask your health care provider. It may also be helpful to avoid exposure to secondhand smoke.  Remember BEFAST for warning signs of stroke. Get help right away if you or a loved one has any of these signs. This information is not intended to replace advice given to you by your health care provider. Make sure you discuss any questions you have with your health care provider. Document Released: 05/30/2004 Document Revised: 05/28/2016 Document Reviewed: 05/28/2016 Elsevier Interactive Patient Education  [employer]2018 Elsevier Inc.

## 2017-05-12 NOTE — Progress Notes (Signed)
I reviewed above note and agree with the assessment and plan.  Marvel PlanJindong Brenen Beigel, MD PhD Stroke Neurology 05/12/2017 10:03 PM

## 2017-06-02 ENCOUNTER — Other Ambulatory Visit: Payer: Self-pay | Admitting: Internal Medicine

## 2017-06-02 ENCOUNTER — Ambulatory Visit
Admission: RE | Admit: 2017-06-02 | Discharge: 2017-06-02 | Disposition: A | Discharge status: Home / Self Care | Payer: Medicare (Managed Care) | Source: Ambulatory Visit | Attending: Internal Medicine | Admitting: Internal Medicine

## 2017-06-02 DIAGNOSIS — R05 Cough: Secondary | ICD-10-CM

## 2017-06-02 DIAGNOSIS — R059 Cough, unspecified: Secondary | ICD-10-CM

## 2017-06-10 ENCOUNTER — Ambulatory Visit: Payer: Medicare (Managed Care) | Admitting: Podiatry

## 2017-06-13 ENCOUNTER — Encounter: Payer: Self-pay | Admitting: Podiatry

## 2017-06-13 ENCOUNTER — Ambulatory Visit: Payer: Medicare Other | Admitting: Podiatry

## 2017-06-13 DIAGNOSIS — I739 Peripheral vascular disease, unspecified: Secondary | ICD-10-CM | POA: Diagnosis not present

## 2017-06-13 DIAGNOSIS — M79676 Pain in unspecified toe(s): Secondary | ICD-10-CM

## 2017-06-13 DIAGNOSIS — B351 Tinea unguium: Secondary | ICD-10-CM

## 2017-06-23 NOTE — Progress Notes (Signed)
  Subjective:  Patient ID: Clayton [name], male    DOB: 03-07-1920,  MRN: [id number]  Chief Complaint  Patient presents with  . Debridement   84 y.o. male returns for diabetic foot care.  Denies numbness and tingling in his feet.  Here for care of his toenails.  Former patient of Dr. Leeanne Deeduchman  Objective:   General AA&O x3. Normal mood and affect.  Vascular Dorsalis pedis pulses present 1+ bilaterally  Posterior tibial pulses absent bilaterally  Capillary refill normal to all digits. Pedal hair growth normal.  Neurologic Epicritic sensation present bilaterally. Protective sensation with 5.07 monofilament  present bilaterally. Vibratory sensation present bilaterally.  Dermatologic No open lesions. Interspaces clear of maceration.  Normal skin temperature and turgor. Hyperkeratotic lesions: None bilaterally. Nails: brittle, onychomycosis, thickening, elongation  Orthopedic: No history of amputation. MMT 5/5 in dorsiflexion, plantarflexion, inversion, and eversion. Normal lower extremity joint ROM without pain or crepitus.   Assessment & Plan:  Patient was evaluated and treated and all questions answered.  Diabetes with PAD, Onychomycosis -Educated on diabetic footcare. Diabetic risk level 1 -At risk foot care provided as below.  Procedure: Nail Debridement Rationale: Patient meets criteria for routine foot care due to Class C Findings Type of Debridement: manual, sharp debridement. Instrumentation: Nail nipper, rotary burr. Number of Nails: 10  Return in about 3 months (around 09/10/2017) for Routine Foot Care.

## 2017-07-17 ENCOUNTER — Telehealth: Payer: Self-pay | Admitting: Nurse Practitioner

## 2017-07-17 MED ORDER — ATORVASTATIN CALCIUM 20 MG PO TABS: 20.0000 mg | ORAL_TABLET | Freq: Every day | ORAL | 0 refills | Status: AC

## 2017-07-17 MED ORDER — CLOPIDOGREL BISULFATE 75 MG PO TABS: 75.0000 mg | ORAL_TABLET | Freq: Every day | ORAL | 0 refills | Status: AC

## 2017-07-17 NOTE — Telephone Encounter (Signed)
Refilled x 1 month, future refills through PCP.

## 2017-07-17 NOTE — Telephone Encounter (Signed)
[facility] called requesting refill for clopidogrel (PLAVIX) 75 MG tablet and atorvastatin (LIPITOR) 20 MG tablet

## 2017-08-17 ENCOUNTER — Other Ambulatory Visit: Payer: Self-pay | Admitting: Neurology

## 2017-09-04 IMAGING — RF DG ESOPHAGUS
11 of 14 series · 15 of 23 positions shown · non-contrast
Comparison: None.

CLINICAL DATA: Initial encounter for difficulty swallowing

EXAM:
ESOPHOGRAM/BARIUM SWALLOW
TECHNIQUE: Single contrast examination was performed using  thin barium..
FLUOROSCOPY TIME:  Radiation Exposure Index (as provided by the
fluoroscopic device):
If the device does not provide the exposure index:
Fluoroscopy Time:  1 minutes and 10 seconds
Number of Acquired Images:

[Series 1: cp_standard · 0.26mm/px · 1 of 1 slices shown (1 of 6)]
[im 1/1]
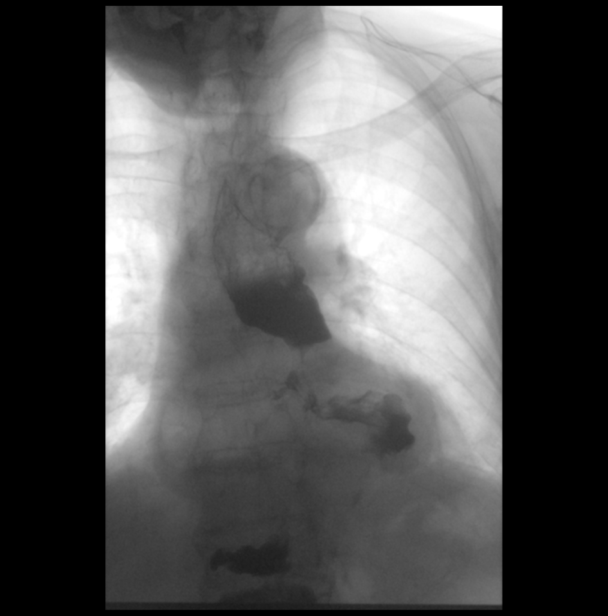

[Series 3: fluoro_barium 2fps_bw · 0.17mm/px · 1 of 1 slices shown (1 of 5)]
[im 1/1]
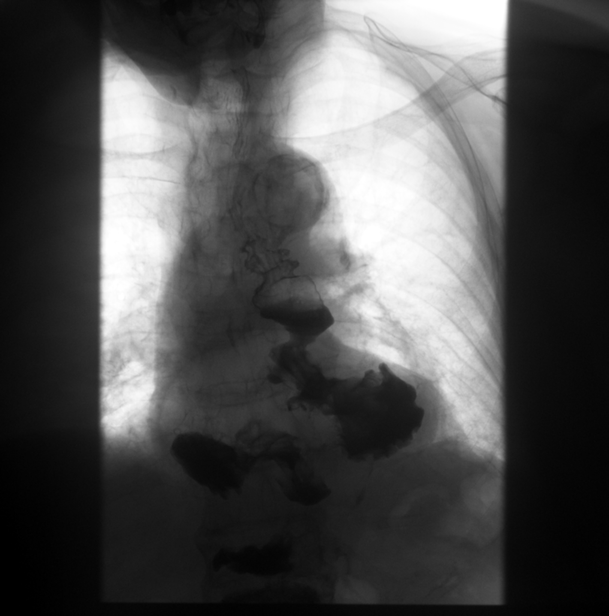

[Series 4: fluoro_barium 2fps_bw · 0.17mm/px · 1 of 1 slices shown (2 of 5)]
[im 1/1]
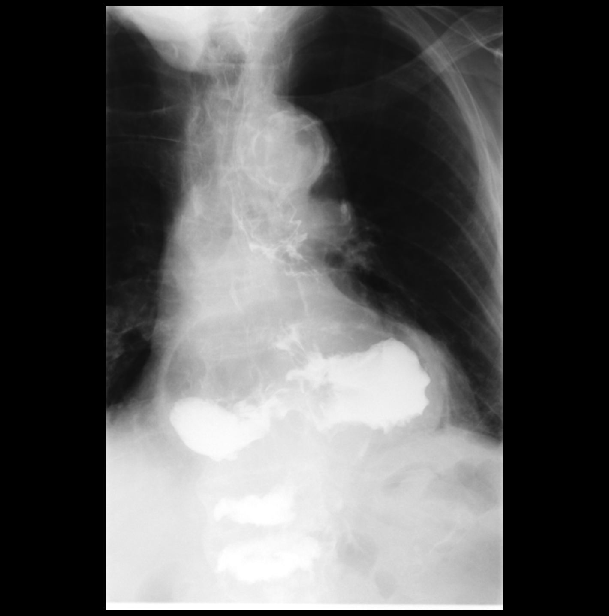

[Series 6: fluoro_barium 2fps_bw · 0.19mm/px · 2 of 3 frames shown (3 of 5)]
[frame 1/3]
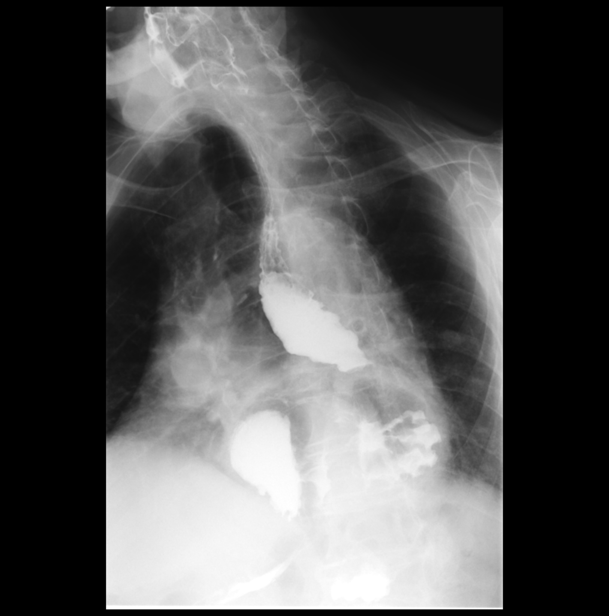
[frame 2/3]
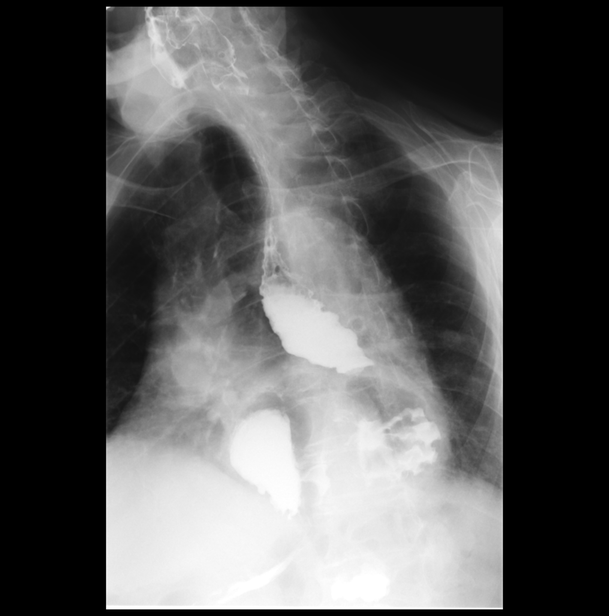

[Series 7: cp_standard · 0.29mm/px · 1 of 1 slices shown (2 of 6)]
[im 1/1]
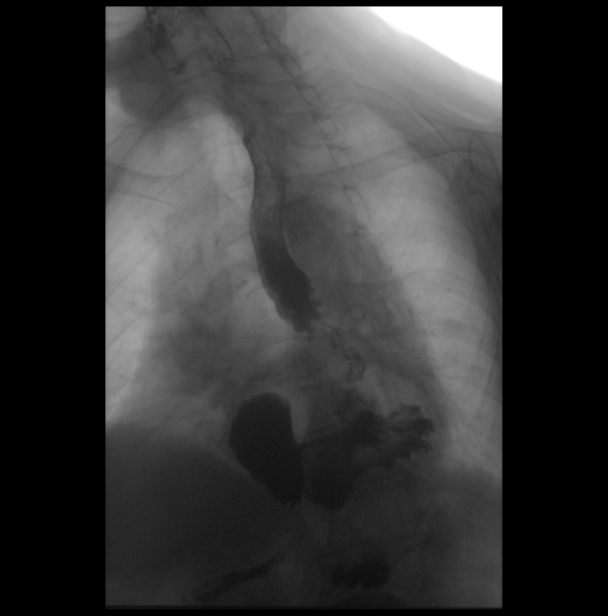

[Series 8: cp_standard · 0.29mm/px · 1 of 1 slices shown (3 of 6)]
[im 1/1]
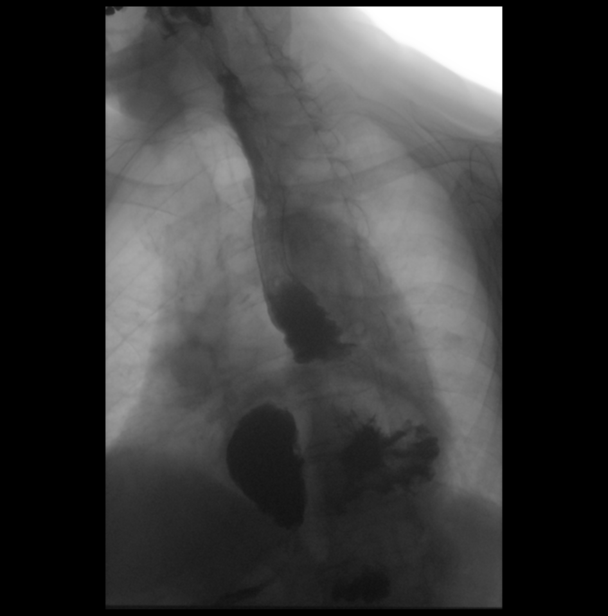

[Series 9: cp_standard · 0.58mm/px · 1 of 3 frames shown (4 of 6)]
[frame 2/3]
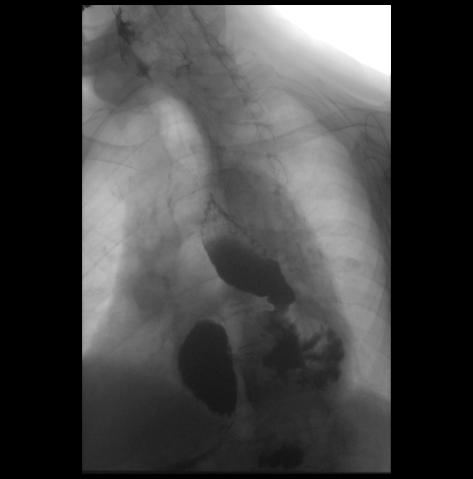

[Series 10: cp_standard · 0.27mm/px · 1 of 1 slices shown (5 of 6)]
[im 1/1]
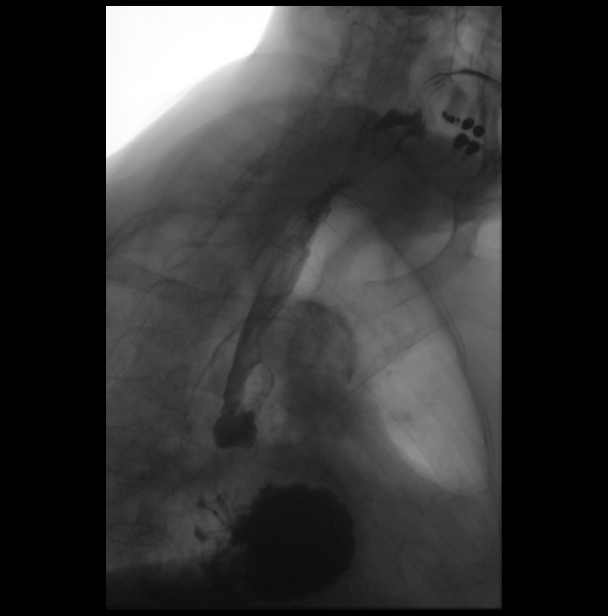

[Series 11: fluoro_barium 2fps_bw · 0.18mm/px · 3 of 5 frames shown (4 of 5)]
[frame 1/5]
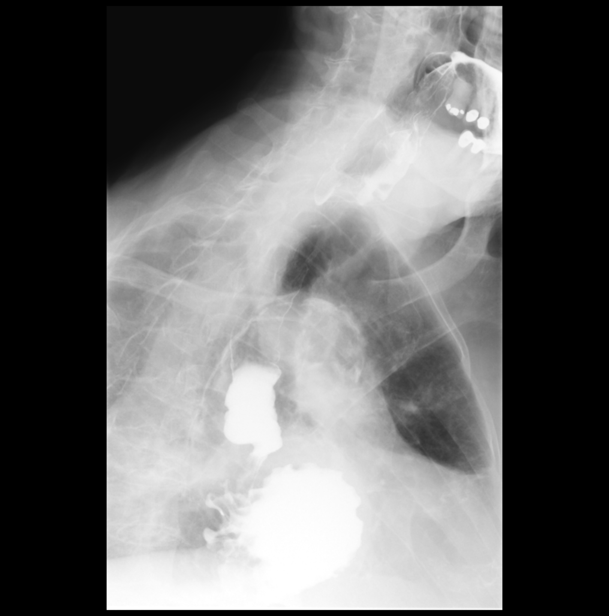
[frame 4/5]
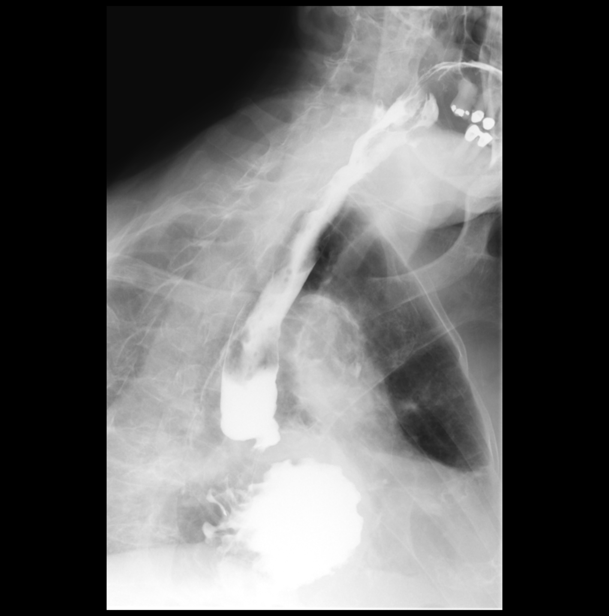
[frame 5/5]
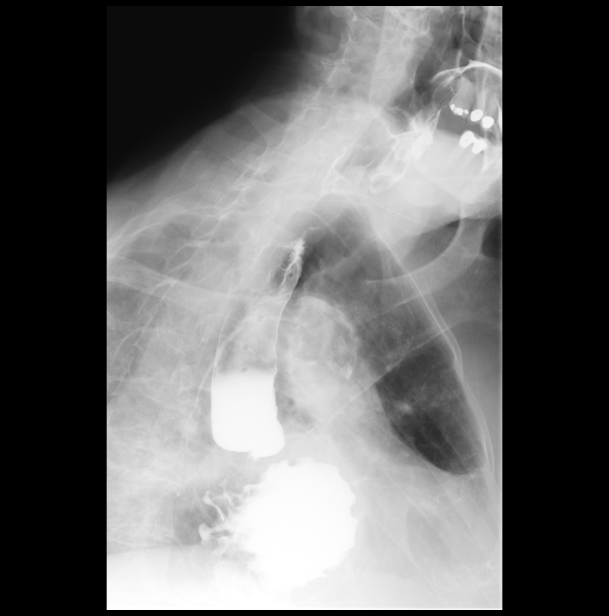

[Series 12: fluoro_barium 2fps_bw · 0.18mm/px · 2 of 9 frames shown (5 of 5)]
[frame 5/9]
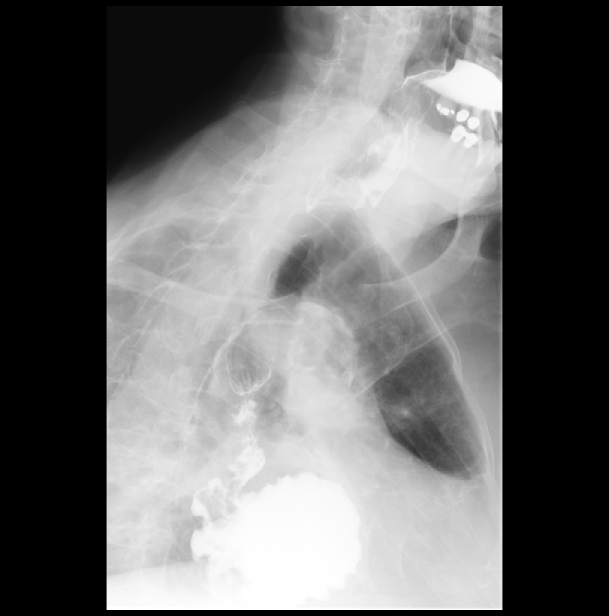
[frame 8/9]
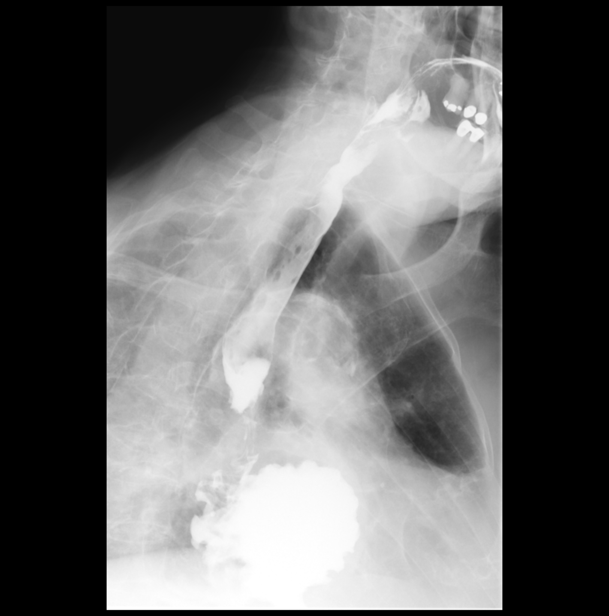

[Series 14: cp_standard · 0.28mm/px · 1 of 1 slices shown (6 of 6)]
[im 1/1]
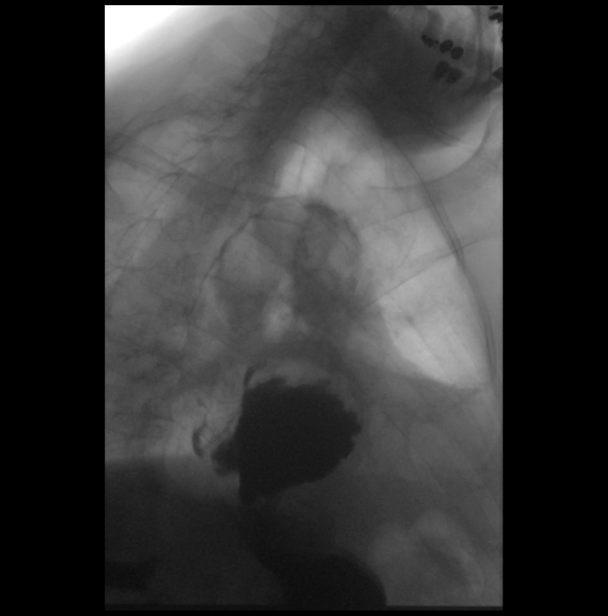

[15 of 23 positions shown; findings below may reference images not displayed]

FINDINGS: Study was limited by patient immobility and piecemeal swallowing.
Moderate to large hiatal hernia is evident. No gross mass or
stricture is identified, but again, assessment was limited by small,
sporadic swallows. Tertiary contractions were visualized throughout
with absence of coordinated esophageal peristalsis. 13 mm barium
tablet passed readily into the stomach when taken with water.
IMPRESSION: 1. Limited study secondary to difficulty with positioning and
piecemeal swallowing.
2. Moderate to large hiatal hernia.
3. Nonspecific esophageal motility disorder.
4. 13 mm barium tablet passes readily into the stomach.

## 2017-09-12 ENCOUNTER — Encounter: Payer: Self-pay | Admitting: Podiatry

## 2017-09-12 ENCOUNTER — Ambulatory Visit: Payer: Medicare Other | Admitting: Podiatry

## 2017-09-12 DIAGNOSIS — D689 Coagulation defect, unspecified: Secondary | ICD-10-CM

## 2017-09-12 DIAGNOSIS — E1159 Type 2 diabetes mellitus with other circulatory complications: Secondary | ICD-10-CM | POA: Diagnosis not present

## 2017-09-12 DIAGNOSIS — B351 Tinea unguium: Secondary | ICD-10-CM

## 2017-09-12 DIAGNOSIS — M79676 Pain in unspecified toe(s): Secondary | ICD-10-CM | POA: Diagnosis not present

## 2017-09-12 DIAGNOSIS — R0989 Other specified symptoms and signs involving the circulatory and respiratory systems: Secondary | ICD-10-CM

## 2017-09-12 NOTE — Progress Notes (Signed)
Complaint:  Visit Type: Patient returns to my office for continued preventative foot care services. Patient comes to the office with his daughter for preventative foot care services. Patient  nails have grown long and thick and become painful to walk and wear shoes" Patient has been diagnosed with DM with angiopathy.. The patient presents for preventative foot care services. No changes to ROS  Podiatric Exam: Vascular: dorsalis pedis  are palpable bilateral.  Posterior tibial pulses are non palpable  B/L Capillary return is immediate. Temperature gradient is WNL. Skin turgor WNL  Sensorium: Normal Semmes Weinstein monofilament test. Normal tactile sensation bilaterally. Nail Exam: Pt has thick disfigured discolored nails with subungual debris noted bilateral entire nail hallux through fifth toenails Ulcer Exam: There is no evidence of ulcer or pre-ulcerative changes or infection. Orthopedic Exam: Muscle tone and strength are WNL. No limitations in general ROM. No crepitus or effusions noted. Foot type and digits show no abnormalities. Bony prominences are unremarkable. Skin: No Porokeratosis. No infection or ulcers  Diagnosis:  Onychomycosis,  Pain in right toe, pain in left toes  Treatment & Plan Procedures and Treatment: Consent by patient was obtained for treatment procedures.   Debridement of mycotic and hypertrophic toenails, 1 through 5 bilateral and clearing of subungual debris. No ulceration, no infection noted.  Return Visit-Office Procedure: Patient instructed to return to the office for a follow up visit 3 months for continued evaluation and treatment.    Sharief Wainwright DPM 

## 2017-12-05 ENCOUNTER — Ambulatory Visit: Payer: Medicare Other | Admitting: Podiatry

## 2018-01-09 ENCOUNTER — Ambulatory Visit: Payer: Medicare Other | Admitting: Podiatry

## 2018-01-09 ENCOUNTER — Encounter: Payer: Self-pay | Admitting: Podiatry

## 2018-01-09 DIAGNOSIS — E1159 Type 2 diabetes mellitus with other circulatory complications: Secondary | ICD-10-CM | POA: Diagnosis not present

## 2018-01-09 DIAGNOSIS — B351 Tinea unguium: Secondary | ICD-10-CM

## 2018-01-09 DIAGNOSIS — M79676 Pain in unspecified toe(s): Secondary | ICD-10-CM | POA: Diagnosis not present

## 2018-01-09 DIAGNOSIS — D689 Coagulation defect, unspecified: Secondary | ICD-10-CM | POA: Diagnosis not present

## 2018-01-09 NOTE — Progress Notes (Signed)
Complaint:  Visit Type: Patient returns to my office for continued preventative foot care services. Patient comes to the office with his daughter for preventative foot care services. Patient  nails have grown long and thick and become painful to walk and wear shoes" Patient has been diagnosed with DM with angiopathy.. The patient presents for preventative foot care services. No changes to ROS  Podiatric Exam: Vascular: dorsalis pedis  are palpable bilateral.  Posterior tibial pulses are non palpable  B/L Capillary return is immediate. Temperature gradient is WNL. Skin turgor WNL  Sensorium: Normal Semmes Weinstein monofilament test. Normal tactile sensation bilaterally. Nail Exam: Pt has thick disfigured discolored nails with subungual debris noted bilateral entire nail hallux through fifth toenails Ulcer Exam: There is no evidence of ulcer or pre-ulcerative changes or infection. Orthopedic Exam: Muscle tone and strength are WNL. No limitations in general ROM. No crepitus or effusions noted. Foot type and digits show no abnormalities. Bony prominences are unremarkable. Skin: No Porokeratosis. No infection or ulcers  Diagnosis:  Onychomycosis,  Pain in right toe, pain in left toes  Treatment & Plan Procedures and Treatment: Consent by patient was obtained for treatment procedures.   Debridement of mycotic and hypertrophic toenails, 1 through 5 bilateral and clearing of subungual debris. No ulceration, no infection noted.  Return Visit-Office Procedure: Patient instructed to return to the office for a follow up visit 3 months for continued evaluation and treatment.    Merek Niu DPM 

## 2018-05-12 ENCOUNTER — Encounter: Payer: Self-pay | Admitting: Podiatry

## 2018-05-12 ENCOUNTER — Ambulatory Visit: Payer: Medicare Other | Admitting: Podiatry

## 2018-05-12 DIAGNOSIS — M79676 Pain in unspecified toe(s): Secondary | ICD-10-CM

## 2018-05-12 DIAGNOSIS — D689 Coagulation defect, unspecified: Secondary | ICD-10-CM | POA: Diagnosis not present

## 2018-05-12 DIAGNOSIS — E1159 Type 2 diabetes mellitus with other circulatory complications: Secondary | ICD-10-CM

## 2018-05-12 DIAGNOSIS — B351 Tinea unguium: Secondary | ICD-10-CM | POA: Diagnosis not present

## 2018-05-12 NOTE — Progress Notes (Signed)
Complaint:  Visit Type: Patient returns to my office for continued preventative foot care services. Patient comes to the office with his daughter for preventative foot care services. Patient  nails have grown long and thick and become painful to walk and wear shoes" Patient has been diagnosed with DM with angiopathy.. The patient presents for preventative foot care services. No changes to ROS  Podiatric Exam: Vascular: dorsalis pedis  are palpable bilateral.  Posterior tibial pulses are non palpable  B/L Capillary return is immediate. Temperature gradient is WNL. Skin turgor WNL  Sensorium: Normal Semmes Weinstein monofilament test. Normal tactile sensation bilaterally. Nail Exam: Pt has thick disfigured discolored nails with subungual debris noted bilateral entire nail hallux through fifth toenails Ulcer Exam: There is no evidence of ulcer or pre-ulcerative changes or infection. Orthopedic Exam: Muscle tone and strength are WNL. No limitations in general ROM. No crepitus or effusions noted. Foot type and digits show no abnormalities. Bony prominences are unremarkable. Skin: No Porokeratosis. No infection or ulcers  Diagnosis:  Onychomycosis,  Pain in right toe, pain in left toes  Treatment & Plan Procedures and Treatment: Consent by patient was obtained for treatment procedures.   Debridement of mycotic and hypertrophic toenails, 1 through 5 bilateral and clearing of subungual debris. No ulceration, no infection noted.  Return Visit-Office Procedure: Patient instructed to return to the office for a follow up visit 3 months for continued evaluation and treatment.    Helane Gunther DPM

## 2018-08-12 ENCOUNTER — Ambulatory Visit: Payer: Medicare Other | Admitting: Podiatry

## 2018-11-18 ENCOUNTER — Encounter: Payer: Self-pay | Admitting: Podiatry

## 2018-11-18 ENCOUNTER — Other Ambulatory Visit: Payer: Self-pay

## 2018-11-18 ENCOUNTER — Ambulatory Visit: Payer: Medicare Other | Admitting: Podiatry

## 2018-11-18 VITALS — Temp 98.2°F

## 2018-11-18 DIAGNOSIS — D689 Coagulation defect, unspecified: Secondary | ICD-10-CM

## 2018-11-18 DIAGNOSIS — M79676 Pain in unspecified toe(s): Secondary | ICD-10-CM | POA: Diagnosis not present

## 2018-11-18 DIAGNOSIS — E1159 Type 2 diabetes mellitus with other circulatory complications: Secondary | ICD-10-CM | POA: Diagnosis not present

## 2018-11-18 DIAGNOSIS — B351 Tinea unguium: Secondary | ICD-10-CM | POA: Diagnosis not present

## 2018-11-18 NOTE — Progress Notes (Signed)
Complaint:  Visit Type: Patient returns to my office for continued preventative foot care services. Patient comes to the office with his daughter for preventative foot care services. Patient  nails have grown long and thick and become painful to walk and wear shoes" Patient has been diagnosed with DM with angiopathy.. The patient presents for preventative foot care services. No changes to ROS  Podiatric Exam: Vascular: dorsalis pedis  Are weakly  palpable bilateral.  Posterior tibial pulses are non palpable  B/L Capillary return is immediate. Temperature gradient is WNL. Skin turgor WNL  Sensorium: Normal Semmes Weinstein monofilament test. Normal tactile sensation bilaterally. Nail Exam: Pt has thick disfigured discolored nails with subungual debris noted bilateral entire nail hallux through fifth toenails Ulcer Exam: There is no evidence of ulcer or pre-ulcerative changes or infection. Orthopedic Exam: Muscle tone and strength are WNL. No limitations in general ROM. No crepitus or effusions noted. Foot type and digits show no abnormalities. Bony prominences are unremarkable. Skin: No Porokeratosis. No infection or ulcers  Diagnosis:  Onychomycosis,  Pain in right toe, pain in left toes  Treatment & Plan Procedures and Treatment: Consent by patient was obtained for treatment procedures.   Debridement of mycotic and hypertrophic toenails, 1 through 5 bilateral and clearing of subungual debris. No ulceration, no infection noted.  Return Visit-Office Procedure: Patient instructed to return to the office for a follow up visit 3 months for continued evaluation and treatment.    Gardiner Barefoot DPM

## 2019-01-05 DEATH — deceased

## 2019-03-24 ENCOUNTER — Ambulatory Visit: Payer: Medicare Other | Admitting: Podiatry
